# Patient Record
Sex: Female | Born: 1935 | Race: White | Hispanic: No | Smoking: Never smoker
Health system: Southern US, Community
[De-identification: ages and names within clinical notes are randomized; demographics above are authoritative.]

## PROBLEM LIST (undated history)

## (undated) DIAGNOSIS — I83009 Varicose veins of unspecified lower extremity with ulcer of unspecified site: Secondary | ICD-10-CM

## (undated) DIAGNOSIS — S81802A Unspecified open wound, left lower leg, initial encounter: Secondary | ICD-10-CM

## (undated) DIAGNOSIS — F32A Depression, unspecified: Secondary | ICD-10-CM

## (undated) DIAGNOSIS — R5383 Other fatigue: Secondary | ICD-10-CM

## (undated) DIAGNOSIS — I10 Essential (primary) hypertension: Secondary | ICD-10-CM

## (undated) DIAGNOSIS — Z8619 Personal history of other infectious and parasitic diseases: Secondary | ICD-10-CM

## (undated) DIAGNOSIS — E785 Hyperlipidemia, unspecified: Secondary | ICD-10-CM

## (undated) DIAGNOSIS — D696 Thrombocytopenia, unspecified: Secondary | ICD-10-CM

## (undated) DIAGNOSIS — L309 Dermatitis, unspecified: Secondary | ICD-10-CM

## (undated) DIAGNOSIS — I739 Peripheral vascular disease, unspecified: Secondary | ICD-10-CM

## (undated) DIAGNOSIS — Z860100 Personal history of colon polyps, unspecified: Secondary | ICD-10-CM

## (undated) DIAGNOSIS — L97909 Non-pressure chronic ulcer of unspecified part of unspecified lower leg with unspecified severity: Secondary | ICD-10-CM

## (undated) DIAGNOSIS — M1612 Unilateral primary osteoarthritis, left hip: Secondary | ICD-10-CM

## (undated) DIAGNOSIS — F419 Anxiety disorder, unspecified: Secondary | ICD-10-CM

## (undated) DIAGNOSIS — I872 Venous insufficiency (chronic) (peripheral): Secondary | ICD-10-CM

## (undated) DIAGNOSIS — I4891 Unspecified atrial fibrillation: Secondary | ICD-10-CM

## (undated) DIAGNOSIS — E559 Vitamin D deficiency, unspecified: Secondary | ICD-10-CM

## (undated) DIAGNOSIS — F0390 Unspecified dementia without behavioral disturbance: Secondary | ICD-10-CM

## (undated) DIAGNOSIS — R5381 Other malaise: Secondary | ICD-10-CM

## (undated) DIAGNOSIS — Z7901 Long term (current) use of anticoagulants: Secondary | ICD-10-CM

## (undated) DIAGNOSIS — R279 Unspecified lack of coordination: Secondary | ICD-10-CM

## (undated) DIAGNOSIS — H9190 Unspecified hearing loss, unspecified ear: Secondary | ICD-10-CM

## (undated) DIAGNOSIS — L97929 Non-pressure chronic ulcer of unspecified part of left lower leg with unspecified severity: Secondary | ICD-10-CM

## (undated) DIAGNOSIS — I509 Heart failure, unspecified: Secondary | ICD-10-CM

## (undated) DIAGNOSIS — R0602 Shortness of breath: Principal | ICD-10-CM

## (undated) DIAGNOSIS — M6281 Muscle weakness (generalized): Secondary | ICD-10-CM

## (undated) DIAGNOSIS — F329 Major depressive disorder, single episode, unspecified: Secondary | ICD-10-CM

## (undated) DIAGNOSIS — J449 Chronic obstructive pulmonary disease, unspecified: Secondary | ICD-10-CM

## (undated) DIAGNOSIS — I83029 Varicose veins of left lower extremity with ulcer of unspecified site: Secondary | ICD-10-CM

## (undated) DIAGNOSIS — R2681 Unsteadiness on feet: Secondary | ICD-10-CM

## (undated) DIAGNOSIS — E039 Hypothyroidism, unspecified: Secondary | ICD-10-CM

## (undated) HISTORY — DX: Hyperlipidemia, unspecified: E78.5

## (undated) HISTORY — DX: Non-pressure chronic ulcer of unspecified part of left lower leg with unspecified severity: L97.929

## (undated) HISTORY — DX: Unspecified hearing loss, unspecified ear: H91.90

## (undated) HISTORY — DX: Unspecified lack of coordination: R27.9

## (undated) HISTORY — DX: Heart failure, unspecified: I50.9

## (undated) HISTORY — DX: Shortness of breath: R06.02

## (undated) HISTORY — PX: CATARACT EXTRACTION W/ INTRAOCULAR LENS  IMPLANT, BILATERAL: SHX1307

## (undated) HISTORY — DX: Essential (primary) hypertension: I10

## (undated) HISTORY — DX: Unspecified open wound, left lower leg, initial encounter: S81.802A

## (undated) HISTORY — DX: Varicose veins of unspecified lower extremity with ulcer of unspecified site: I83.009

## (undated) HISTORY — DX: Other malaise: R53.81

## (undated) HISTORY — DX: Unilateral primary osteoarthritis, left hip: M16.12

## (undated) HISTORY — DX: Vitamin D deficiency, unspecified: E55.9

## (undated) HISTORY — DX: Personal history of colonic polyps: Z86.010

## (undated) HISTORY — DX: Muscle weakness (generalized): M62.81

## (undated) HISTORY — PX: CORONARY ANGIOPLASTY: SHX604

## (undated) HISTORY — DX: Other fatigue: R53.83

## (undated) HISTORY — DX: Varicose veins of unspecified lower extremity with ulcer of unspecified site: L97.909

## (undated) HISTORY — DX: Hypothyroidism, unspecified: E03.9

## (undated) HISTORY — DX: Unspecified atrial fibrillation: I48.91

## (undated) HISTORY — DX: Personal history of other infectious and parasitic diseases: Z86.19

## (undated) HISTORY — DX: Dermatitis, unspecified: L30.9

## (undated) HISTORY — DX: Varicose veins of left lower extremity with ulcer of unspecified site: I83.029

## (undated) HISTORY — DX: Peripheral vascular disease, unspecified: I73.9

## (undated) HISTORY — DX: Thrombocytopenia, unspecified: D69.6

## (undated) HISTORY — DX: Long term (current) use of anticoagulants: Z79.01

## (undated) HISTORY — DX: Unsteadiness on feet: R26.81

## (undated) HISTORY — DX: Personal history of colon polyps, unspecified: Z86.0100

## (undated) HISTORY — DX: Venous insufficiency (chronic) (peripheral): I87.2

## (undated) HISTORY — PX: EYE SURGERY: SHX253

---

## 2014-01-15 DIAGNOSIS — L309 Dermatitis, unspecified: Secondary | ICD-10-CM

## 2014-01-15 HISTORY — DX: Dermatitis, unspecified: L30.9

## 2014-02-16 LAB — CBC AND DIFFERENTIAL
HEMATOCRIT: 42 % (ref 36–46)
Hemoglobin: 14.1 g/dL (ref 12.0–16.0)
Platelets: 192 10*3/uL (ref 150–399)
WBC: 8.9 10*3/mL

## 2014-02-16 LAB — BASIC METABOLIC PANEL
BUN: 39 mg/dL — AB (ref 4–21)
Creatinine: 1 mg/dL (ref 0.5–1.1)
GLUCOSE: 80 mg/dL
Potassium: 4.1 mmol/L (ref 3.4–5.3)
SODIUM: 142 mmol/L (ref 137–147)

## 2014-02-17 ENCOUNTER — Non-Acute Institutional Stay (SKILLED_NURSING_FACILITY): Payer: Medicare Other | Admitting: Adult Health

## 2014-02-17 DIAGNOSIS — I83029 Varicose veins of left lower extremity with ulcer of unspecified site: Secondary | ICD-10-CM

## 2014-02-17 DIAGNOSIS — Z7901 Long term (current) use of anticoagulants: Secondary | ICD-10-CM

## 2014-02-17 DIAGNOSIS — E039 Hypothyroidism, unspecified: Secondary | ICD-10-CM

## 2014-02-17 DIAGNOSIS — I10 Essential (primary) hypertension: Secondary | ICD-10-CM

## 2014-02-17 DIAGNOSIS — L97929 Non-pressure chronic ulcer of unspecified part of left lower leg with unspecified severity: Secondary | ICD-10-CM

## 2014-02-17 DIAGNOSIS — L97909 Non-pressure chronic ulcer of unspecified part of unspecified lower leg with unspecified severity: Secondary | ICD-10-CM

## 2014-02-17 DIAGNOSIS — M1990 Unspecified osteoarthritis, unspecified site: Secondary | ICD-10-CM

## 2014-02-17 DIAGNOSIS — I83009 Varicose veins of unspecified lower extremity with ulcer of unspecified site: Secondary | ICD-10-CM

## 2014-02-17 DIAGNOSIS — I4891 Unspecified atrial fibrillation: Secondary | ICD-10-CM

## 2014-02-17 DIAGNOSIS — R21 Rash and other nonspecific skin eruption: Secondary | ICD-10-CM

## 2014-02-17 DIAGNOSIS — I509 Heart failure, unspecified: Secondary | ICD-10-CM

## 2014-02-19 ENCOUNTER — Encounter: Payer: Self-pay | Admitting: Adult Health

## 2014-02-19 DIAGNOSIS — Z7901 Long term (current) use of anticoagulants: Secondary | ICD-10-CM | POA: Insufficient documentation

## 2014-02-19 DIAGNOSIS — M1990 Unspecified osteoarthritis, unspecified site: Secondary | ICD-10-CM | POA: Insufficient documentation

## 2014-02-19 DIAGNOSIS — I509 Heart failure, unspecified: Secondary | ICD-10-CM | POA: Insufficient documentation

## 2014-02-19 DIAGNOSIS — I10 Essential (primary) hypertension: Secondary | ICD-10-CM | POA: Insufficient documentation

## 2014-02-19 DIAGNOSIS — I83029 Varicose veins of left lower extremity with ulcer of unspecified site: Secondary | ICD-10-CM | POA: Insufficient documentation

## 2014-02-19 DIAGNOSIS — E039 Hypothyroidism, unspecified: Secondary | ICD-10-CM | POA: Insufficient documentation

## 2014-02-19 DIAGNOSIS — I4891 Unspecified atrial fibrillation: Secondary | ICD-10-CM | POA: Insufficient documentation

## 2014-02-19 DIAGNOSIS — R21 Rash and other nonspecific skin eruption: Secondary | ICD-10-CM | POA: Insufficient documentation

## 2014-02-19 DIAGNOSIS — L97929 Non-pressure chronic ulcer of unspecified part of left lower leg with unspecified severity: Secondary | ICD-10-CM

## 2014-02-19 NOTE — Progress Notes (Signed)
Patient ID: Brittany Schultz, female   DOB: March 17, 1922, 80 y.o.   MRN: [id number]     [facility]  Allergies  Allergen Reactions  . Codeine   . Penicillins     Chief Complaint  Patient presents with  . Hospitalization Follow-up    HPI:  She has been hospitalized due to rash to her left lower leg from an unna boot. She did require use of steroids to control her rash. She is here for short term rehab due to physical weakness and wound management. Her goal is to return back home.    Past Medical History  Diagnosis Date  . CHF (congestive heart failure)   . Thyroid disease   . Hypertension   . Arthritis   . A-fib   . PVD (peripheral vascular disease)   . Venous stasis ulcer of left lower extremity      VITAL SIGNS BP 129/78  Pulse 70  Ht 5\' 7"  (1.702 m)  Wt 194 lb (87.998 kg)  BMI 30.38 kg/m2   Patient's Medications  New Prescriptions   No medications on file  Previous Medications   CARVEDILOL (COREG) 6.25 MG TABLET    Take 6.25 mg by mouth 2 (two) times daily with a meal.   FUROSEMIDE (LASIX) 40 MG TABLET    Take 40 mg by mouth 2 (two) times daily.   LEVOTHYROXINE (SYNTHROID, LEVOTHROID) 125 MCG TABLET    Take 125 mcg by mouth daily before breakfast.   MELOXICAM (MOBIC) 7.5 MG TABLET    Take 7.5 mg by mouth daily.   NONFORMULARY OR COMPOUNDED ITEM    Take 1 capsule by mouth 2 (two) times daily. Takes GARDEN BLEND; VINEYARD BLEND; ORCHARD BLEND; juice plus vitamins twice daily   POTASSIUM CHLORIDE SA (K-DUR,KLOR-CON) 20 MEQ TABLET    Take 40 mEq by mouth 2 (two) times daily.   WARFARIN (COUMADIN) 1 MG TABLET    Take 3-4 mg by mouth daily at 6 PM. Alternating doses of 3 mg and 4 mg  Modified Medications   No medications on file  Discontinued Medications   No medications on file    SIGNIFICANT DIAGNOSTIC EXAMS  None recent  LABS REVIEWED:  02-11-14:wbc 5.0; hgb 13.6; hct 40.4; mcv 93.9plt 144; glucose 142; bun 24; creat 1.42; k+4.5; na++135 02-15-12: wbc10.3;  hgb 13.0; hct 30.4 mcv 94.3; plt 195;  02-15-14: wbc 9.5; hgb 14.1;hct 40.8; mcv 94.1; plt 203; glucose 108; bun 40; creat 1.18; k+4.3; na++142 02-16-14: wbc 8.9; hgb 14.1;hct 42.2; mcv 93.6; plt 192; glucose 80; bun 39; creat 0.97; k+4.1; na++142     Review of Systems  Constitutional: Negative for malaise/fatigue.  Respiratory: Negative for cough, shortness of breath and wheezing.   Cardiovascular: Positive for leg swelling. Negative for chest pain and palpitations.       Has chronic lower extremity edema  Gastrointestinal: Negative for heartburn, abdominal pain and constipation.  Musculoskeletal: Negative for joint pain and myalgias.  Skin: Positive for itching and rash.       On back and abdomen   Neurological: Negative for weakness.  Psychiatric/Behavioral: Negative for depression. The patient is not nervous/anxious.      Physical Exam  Constitutional: She is oriented to person, place, and time. She appears well-developed and well-nourished. No distress.  obese  Neck: Neck supple. No JVD present. No thyromegaly present.  Cardiovascular: Normal rate, regular rhythm and intact distal pulses.   Pedal pulses diminished   Respiratory: Effort normal and breath sounds normal.  GI: Soft. Bowel sounds are normal. She exhibits no distension. There is no tenderness.  Musculoskeletal: Normal range of motion. She exhibits edema.  Has trace lower extremity edema present   Neurological: She is alert and oriented to person, place, and time.  Skin: Skin is warm and dry. She is not diaphoretic.  Has red raised rash covering her entire back and sides; which is itching.  Her lower legs are discolored due to pvd; and are tender to touch.  Her lower left leg venous ulcer with dressing and ace wrap without signs of infection present.   Psychiatric: She has a normal mood and affect.      ASSESSMENT/ PLAN:  1. Hypertension: is stable will continue coreg 6.25 mg twice daily and will monitor her  status   2. Hypothyroidism: will cotinue synthroid 125 mcg daily   3. Osteoarthritis: she is stable no complaints of pain present. Will continue mobic 7.5 mg daily   4. CHF: she is presently stable will continue lasix 40 mg daily with k+ 40 meq twice daily and will monitor her status   5. Macular rash; will begin prednisone 40 mg daily for 2 days then 20 mg daily for 2 days then stop. Will use triamcinolone 0.1 %crm to her body twice daily for her rash; and will use atarax 25 mg every 6 hours as needed for itching and will monitor her status  6. Venous stasis ulcer left lower leg; will continue her current treatment and will monitor her status.   7. Afib/anticoagulation management: her heart rate is stable her inr today is 1.9; will continue her alternating coumadin 3gm/4 mg and will check inr on 02-20-14 and will monitor her status.   Will check a cbc; cmp; tsh next draw   Time spent with patient 50 minutes.      Synthia Innocent NP [facility]  Contact [phone] Monday through Friday 8am- 5pm  After hours call [phone]

## 2014-02-20 ENCOUNTER — Encounter: Payer: Self-pay | Admitting: Internal Medicine

## 2014-02-20 ENCOUNTER — Non-Acute Institutional Stay (SKILLED_NURSING_FACILITY): Payer: Medicare Other | Admitting: Internal Medicine

## 2014-02-20 DIAGNOSIS — L97929 Non-pressure chronic ulcer of unspecified part of left lower leg with unspecified severity: Secondary | ICD-10-CM

## 2014-02-20 DIAGNOSIS — L97909 Non-pressure chronic ulcer of unspecified part of unspecified lower leg with unspecified severity: Secondary | ICD-10-CM

## 2014-02-20 DIAGNOSIS — I4891 Unspecified atrial fibrillation: Secondary | ICD-10-CM

## 2014-02-20 DIAGNOSIS — K5900 Constipation, unspecified: Secondary | ICD-10-CM

## 2014-02-20 DIAGNOSIS — R531 Weakness: Secondary | ICD-10-CM

## 2014-02-20 DIAGNOSIS — R5383 Other fatigue: Secondary | ICD-10-CM

## 2014-02-20 DIAGNOSIS — E039 Hypothyroidism, unspecified: Secondary | ICD-10-CM

## 2014-02-20 DIAGNOSIS — I83009 Varicose veins of unspecified lower extremity with ulcer of unspecified site: Secondary | ICD-10-CM

## 2014-02-20 DIAGNOSIS — R5381 Other malaise: Secondary | ICD-10-CM

## 2014-02-20 DIAGNOSIS — I83029 Varicose veins of left lower extremity with ulcer of unspecified site: Secondary | ICD-10-CM

## 2014-02-20 DIAGNOSIS — L259 Unspecified contact dermatitis, unspecified cause: Secondary | ICD-10-CM

## 2014-02-20 DIAGNOSIS — L309 Dermatitis, unspecified: Secondary | ICD-10-CM

## 2014-02-20 NOTE — Progress Notes (Signed)
Patient ID: Brittany Schultz, female   DOB: Oct 21, 1922, 80 y.o.   MRN: [id number]     [facility]  PCP- Dr Tamala Julian  Code Status: full code  Allergies  Allergen Reactions  . Codeine   . Penicillins     Chief Complaint: new admit  HPI:  80 y/o female patient is here for STR after hospital admission to [facility] from 02/10/14- 02/16/14 through wound clinic because of increased rash on her legs. Her unna boot was removed and she was put on steroids and the rash started resolving. Given her weakness, she was sent to SNF for rehabilitation. Goal is for her to return home. She is seen in her room today. She complaints of not having bowel movement for 3 days. She also complaints of rash in her arms and trunk and itching. She has pain in her left leg which is burning in nature with pricking sensation at times No new concern from staff. She has been working with therapy team  Review of Systems:  Constitutional: Negative for fever, chills, weight loss, diaphoresis. feels tired and weak HENT: Negative for congestion, hearing loss and sore throat.   Eyes: Negative for eye pain, blurred vision, double vision and discharge.  Respiratory: Negative for cough, sputum production, shortness of breath and wheezing.   Cardiovascular: Negative for chest pain, palpitations, orthopnea. Has leg swelling.  Gastrointestinal: Negative for heartburn, nausea, vomiting, abdominal pain, diarrhea  Genitourinary: Negative for dysuria, urgency, hematuria and flank pain. has increased urinary frequency Musculoskeletal: Negative for back pain, falls, joint pain and myalgias.  Neurological: Negative for dizziness, tingling, focal weakness and headaches.  Psychiatric/Behavioral: Negative for depression and memory loss. The patient is not nervous/anxious.     Past Medical History  Diagnosis Date  . CHF (congestive heart failure)   . Thyroid disease   . Hypertension   . Arthritis   . A-fib   . PVD  (peripheral vascular disease)   . Venous stasis ulcer of left lower extremity    History reviewed. No pertinent past surgical history. Social History:   has no tobacco, alcohol, and drug history on file.  Family History  Problem Relation Age of Onset  . Hypertension Mother   . Stroke Mother   . Heart disease Mother   . Hypertension Father   . Hypertension Brother     Medications: Patient's Medications  New Prescriptions   No medications on file  Previous Medications   CARVEDILOL (COREG) 6.25 MG TABLET    Take 6.25 mg by mouth 2 (two) times daily with a meal.   FUROSEMIDE (LASIX) 40 MG TABLET    Take 40 mg by mouth 2 (two) times daily.   LEVOTHYROXINE (SYNTHROID, LEVOTHROID) 125 MCG TABLET    Take 125 mcg by mouth daily before breakfast.   MELOXICAM (MOBIC) 7.5 MG TABLET    Take 7.5 mg by mouth daily.   NONFORMULARY OR COMPOUNDED ITEM    Take 1 capsule by mouth 2 (two) times daily. Takes GARDEN BLEND; VINEYARD BLEND; ORCHARD BLEND; juice plus vitamins twice daily   POTASSIUM CHLORIDE SA (K-DUR,KLOR-CON) 20 MEQ TABLET    Take 40 mEq by mouth 2 (two) times daily.   WARFARIN (COUMADIN) 1 MG TABLET    Take 3-4 mg by mouth daily at 6 PM. Alternating doses of 3 mg and 4 mg  Modified Medications   No medications on file  Discontinued Medications   No medications on file     Physical Exam:  Filed Vitals:   02/20/14 0951  BP: 102/65  Pulse: 89  Temp: 97.4 F (36.3 C)  Resp: 16  SpO2: 95%    General- elderly obese female in no acute distress Head- atraumatic, normocephalic Eyes- PERRLA, EOMI, no pallor, no icterus, no discharge Neck- no lymphadenopathy, no jugular vein distension Cardiovascular- irregular heart rate normal, no murmurs/ rubs/ gallops Respiratory- bilateral clear to auscultation, no wheeze, no rhonchi, no crackles, no use of accessory muscles Abdomen- bowel sounds present, soft, non tender Musculoskeletal- able to move all 4 extremities, no spinal and  paraspinal tenderness, left leg edema, left leg wrapped in ACE wrap Neurological- no focal deficit Skin- warm and dry, left lower leg venous ulcer present, macular rash in trunk and arm, blanching present, few ecchymoses noted in arms Psychiatry- alert and oriented to person, place and time, normal mood and affect   Labs reviewed: 02-11-14:wbc 5.0; hgb 13.6; hct 40.4; mcv 93.9plt 144; glucose 142; bun 24; creat 1.42; k+4.5; na++135 02-15-12: wbc10.3; hgb 13.0; hct 30.4 mcv 94.3; plt 195;   02-15-14: wbc 9.5; hgb 14.1;hct 40.8; mcv 94.1; plt 203; glucose 108; bun 40; creat 1.18; k+4.3; na++142 02-16-14: wbc 8.9; hgb 14.1;hct 42.2; mcv 93.6; plt 192; glucose 80; bun 39; creat 0.97; k+4.1; na++142    Assessment/Plan  Dermatitis- has recurrence of the rash she had in the hospital. It is present in her arms and trunk area. Will have her on prednisone 10 mg daily for 2 weeks for now. D/c triamcinolone cream. Will also change atarax to 25 mg q8h to help with itching. Check ESR to assess for inflammatory process. Check cbc with diff and lft. If no improvement, will need dermatology referral   Constipation- will have her on seena-s 1 tab daily and mirlax x1 now. Will also have miralax 17 g daily as needed for constipation if no bowel movement for more than 2 days  Generalized weakness- here for STR. Will have her work with PT and OT for strengthening exercises. Fall precautions  Venous stasis ulcer- in left lower leg. Will have her on gabapentin 100 mg daily for pain in the legs for now. Continue skin and wound care. Has gel mattress. Continue lasix 40 mg daily for the edema from stasis with kcl supplement. Check bmp  afib- rate controlled. Continue coreg for rate control and coumadin for stroke prophylaxis. inr due today  Hypothyroidism- continue synthroid 125 mcg daily   Family/ staff Communication: reviewed care plan with patient and nursing supervisor. Talked with her grand daughter, answered her  questions and reviewed care plan  Goals of care: home after STR   Labs/tests ordered: cbc, cmp, ESR    Blanchie Serve, MD  [facility] [phone] (Monday-Friday 8 am - 5 pm) [phone] (afterhours)

## 2014-02-21 DIAGNOSIS — K5900 Constipation, unspecified: Secondary | ICD-10-CM | POA: Insufficient documentation

## 2014-02-21 DIAGNOSIS — L309 Dermatitis, unspecified: Secondary | ICD-10-CM | POA: Insufficient documentation

## 2014-02-21 DIAGNOSIS — E039 Hypothyroidism, unspecified: Secondary | ICD-10-CM | POA: Insufficient documentation

## 2014-02-21 DIAGNOSIS — R531 Weakness: Secondary | ICD-10-CM | POA: Insufficient documentation

## 2014-02-23 ENCOUNTER — Non-Acute Institutional Stay (SKILLED_NURSING_FACILITY): Payer: Medicare Other | Admitting: Adult Health

## 2014-02-23 DIAGNOSIS — L97909 Non-pressure chronic ulcer of unspecified part of unspecified lower leg with unspecified severity: Secondary | ICD-10-CM

## 2014-02-23 DIAGNOSIS — I83009 Varicose veins of unspecified lower extremity with ulcer of unspecified site: Secondary | ICD-10-CM

## 2014-02-23 DIAGNOSIS — I83029 Varicose veins of left lower extremity with ulcer of unspecified site: Secondary | ICD-10-CM

## 2014-02-23 DIAGNOSIS — L97929 Non-pressure chronic ulcer of unspecified part of left lower leg with unspecified severity: Principal | ICD-10-CM

## 2014-02-23 DIAGNOSIS — L03119 Cellulitis of unspecified part of limb: Secondary | ICD-10-CM

## 2014-02-23 DIAGNOSIS — L02419 Cutaneous abscess of limb, unspecified: Secondary | ICD-10-CM

## 2014-02-25 ENCOUNTER — Encounter: Payer: Self-pay | Admitting: Adult Health

## 2014-02-25 DIAGNOSIS — L03119 Cellulitis of unspecified part of limb: Secondary | ICD-10-CM

## 2014-02-25 DIAGNOSIS — L02419 Cutaneous abscess of limb, unspecified: Secondary | ICD-10-CM | POA: Insufficient documentation

## 2014-02-25 NOTE — Progress Notes (Signed)
Patient ID: Brittany Schultz, female   DOB: Jan 08, 1922, 80 y.o.   MRN: [id number]    [facility]  Allergies  Allergen Reactions  . Codeine   . Penicillins      Chief Complaint  Patient presents with  . Acute Visit    wound management     HPI:  Her venous ulcerations have become inflamed and have a foul odor present. Then nursing staff is concerned that she has developed an infection.  Her pain is being managed with her neurontin and vicodin. Her treatment had been changed to silvergel several days ago and since that time her wounds have declined.    Past Medical History  Diagnosis Date  . CHF (congestive heart failure)   . Thyroid disease   . Hypertension   . Arthritis   . A-fib   . PVD (peripheral vascular disease)   . Venous stasis ulcer of left lower extremity     No past surgical history on file.  VITAL SIGNS BP 132/79  Pulse 70  Ht 5\' 7"  (1.702 m)  Wt 194 lb (87.998 kg)  BMI 30.38 kg/m2   Patient's Medications  New Prescriptions   No medications on file  Previous Medications   CARVEDILOL (COREG) 6.25 MG TABLET    Take 6.25 mg by mouth 2 (two) times daily with a meal.   FUROSEMIDE (LASIX) 40 MG TABLET    Take 40 mg by mouth 2 (two) times daily.   neurontin 100 mg  Take nightly    LEVOTHYROXINE (SYNTHROID, LEVOTHROID) 125 MCG TABLET    Take 125 mcg by mouth daily before breakfast.   MELOXICAM (MOBIC) 7.5 MG TABLET    Take 7.5 mg by mouth daily.   NONFORMULARY OR COMPOUNDED ITEM    Take 1 capsule by mouth 2 (two) times daily. Takes GARDEN BLEND; VINEYARD BLEND; ORCHARD BLEND; juice plus vitamins twice daily   POTASSIUM CHLORIDE SA (K-DUR,KLOR-CON) 20 MEQ TABLET    Take 40 mEq by mouth 2 (two) times daily.   vicodin 5/325 mg  Take prior to dressing change    Prednisone 10 mg  Take daily for 2 weeks from 02-20-14   miralax 17 gm daily prn    Senna s tab   take Nightly    WARFARIN (COUMADIN) 1 MG TABLET    Take 3-4 mg by mouth daily at 6 PM. Alternating doses  of 3 mg and 4 mg  Modified Medications   No medications on file  Discontinued Medications   No medications on file    SIGNIFICANT DIAGNOSTIC EXAMS  None recent  LABS REVIEWED:  02-11-14:wbc 5.0; hgb 13.6; hct 40.4; mcv 93.9plt 144; glucose 142; bun 24; creat 1.42; k+4.5; na++135 02-15-12: wbc10.3; hgb 13.0; hct 30.4 mcv 94.3; plt 195;  02-15-14: wbc 9.5; hgb 14.1;hct 40.8; mcv 94.1; plt 203; glucose 108; bun 40; creat 1.18; k+4.3; na++142 02-16-14: wbc 8.9; hgb 14.1;hct 42.2; mcv 93.6; plt 192; glucose 80; bun 39; creat 0.97; k+4.1; na++142     Review of Systems  Constitutional: Negative for malaise/fatigue.  Respiratory: Negative for cough, shortness of breath and wheezing.   Cardiovascular: Positive for leg swelling. Negative for chest pain and palpitations.       Has chronic lower extremity edema  Gastrointestinal: Negative for heartburn, abdominal pain and constipation.  Musculoskeletal: Negative for joint pain and myalgias.  Skin: positive for wounds on left leg.   Neurological: Negative for weakness.  Psychiatric/Behavioral: Negative for depression. The patient is not nervous/anxious.  Physical Exam  Constitutional: She is oriented to person, place, and time. She appears well-developed and well-nourished. No distress.  obese  Neck: Neck supple. No JVD present. No thyromegaly present.  Cardiovascular: Normal rate, regular rhythm and intact distal pulses.   Pedal pulses diminished   Respiratory: Effort normal and breath sounds normal.  GI: Soft. Bowel sounds are normal. She exhibits no distension. There is no tenderness.  Musculoskeletal: Normal range of motion. She exhibits edema.  Has trace lower extremity edema present   Neurological: She is alert and oriented to person, place, and time.  Skin: Skin is warm and dry. She is not diaphoretic.  Her left lower leg ulcerations are inflamed there is scant amount of drainage present there is foul odor present.  Psychiatric:  She has a normal mood and affect.      ASSESSMENT/ PLAN:  1. Venous stasis ulcerations: culture obtained; will begin ca++ alginate with santyl dressing. Will begin clindamycin 30 mg every 6 hours for 3 weeks with florastor twice daily for 3 weeks pending culture report and will continue to monitor her status.       Synthia Innocent NP [facility]  Contact [phone] Monday through Friday 8am- 5pm  After hours call [phone]

## 2014-02-27 ENCOUNTER — Encounter: Payer: Self-pay | Admitting: Adult Health

## 2014-02-27 ENCOUNTER — Non-Acute Institutional Stay (SKILLED_NURSING_FACILITY): Payer: Medicare Other | Admitting: Adult Health

## 2014-02-27 DIAGNOSIS — I4891 Unspecified atrial fibrillation: Secondary | ICD-10-CM

## 2014-02-27 DIAGNOSIS — Z7901 Long term (current) use of anticoagulants: Secondary | ICD-10-CM

## 2014-02-27 NOTE — Progress Notes (Signed)
Patient ID: Brittany Schultz, female   DOB: 03-18-1922, 80 y.o.   MRN: [id number]     [facility]  Allergies  Allergen Reactions  . Codeine   . Penicillins      Chief Complaint  Patient presents with  . Acute Visit    coumadin management     HPI:  She is on long term coumadin therapy for afib. Her inr today is 5.1 and she is on alternating dose of 3 mg and 4 mg daily. Her heart rate is under control. There are no concerns being voiced by the nursing staff at this time. She is presently on clindamycin for her left leg wounds.    Past Medical History  Diagnosis Date  . CHF (congestive heart failure)   . Thyroid disease   . Hypertension   . Arthritis   . A-fib   . PVD (peripheral vascular disease)   . Venous stasis ulcer of left lower extremity     No past surgical history on file.  VITAL SIGNS BP 117/70  Pulse 70  Ht 5\' 7"  (1.702 m)  Wt 199 lb 1.6 oz (90.311 kg)  BMI 31.18 kg/m2   Patient's Medications  New Prescriptions   No medications on file  Previous Medications   CARVEDILOL (COREG) 6.25 MG TABLET    Take 6.25 mg by mouth 2 (two) times daily with a meal.   FUROSEMIDE (LASIX) 40 MG TABLET    Take 40 mg by mouth 2 (two) times daily.   GABAPENTIN (NEURONTIN) 100 MG CAPSULE    Take 100 mg by mouth at bedtime.   HYDROCODONE-ACETAMINOPHEN (NORCO/VICODIN) 5-325 MG PER TABLET    Take 1 tablet by mouth daily as needed for moderate pain. Prior to dressing change   HYDROXYZINE (ATARAX/VISTARIL) 25 MG TABLET    Take 25 mg by mouth 3 (three) times daily.   LEVOTHYROXINE (SYNTHROID, LEVOTHROID) 125 MCG TABLET    Take 125 mcg by mouth daily before breakfast.   MELOXICAM (MOBIC) 7.5 MG TABLET    Take 7.5 mg by mouth daily.   NONFORMULARY OR COMPOUNDED ITEM    Take 1 capsule by mouth 2 (two) times daily. Takes GARDEN BLEND; VINEYARD BLEND; ORCHARD BLEND; juice plus vitamins twice daily   POLYETHYLENE GLYCOL (MIRALAX / GLYCOLAX) PACKET    Take 17 g by mouth daily as needed.     POTASSIUM CHLORIDE SA (K-DUR,KLOR-CON) 20 MEQ TABLET    Take 40 mEq by mouth 2 (two) times daily.   SENNOSIDES-DOCUSATE SODIUM (SENOKOT-S) 8.6-50 MG TABLET    Take 1 tablet by mouth daily.   WARFARIN (COUMADIN) 1 MG TABLET    Take 3-4 mg by mouth daily at 6 PM. Alternating doses of 3 mg and 4 mg  Modified Medications   No medications on file  Discontinued Medications   No medications on file    SIGNIFICANT DIAGNOSTIC EXAMS  None recent  LABS REVIEWED:  02-11-14:wbc 5.0; hgb 13.6; hct 40.4; mcv 93.9plt 144; glucose 142; bun 24; creat 1.42; k+4.5; na++135 02-15-12: wbc10.3; hgb 13.0; hct 30.4 mcv 94.3; plt 195;  02-15-14: wbc 9.5; hgb 14.1;hct 40.8; mcv 94.1; plt 203; glucose 108; bun 40; creat 1.18; k+4.3; na++142 02-16-14: wbc 8.9; hgb 14.1;hct 42.2; mcv 93.6; plt 192; glucose 80; bun 39; creat 0.97; k+4.1; na++142     Review of Systems  Constitutional: Negative for malaise/fatigue.  Respiratory: Negative for cough, shortness of breath and wheezing.   Cardiovascular: Positive for leg swelling. Negative for chest pain and  palpitations.       Has chronic lower extremity edema  Gastrointestinal: Negative for heartburn, abdominal pain and constipation.  Musculoskeletal: Negative for joint pain and myalgias.  Skin: positive for wounds on left leg.   Neurological: Negative for weakness.  Psychiatric/Behavioral: Negative for depression. The patient is not nervous/anxious.      Physical Exam  Constitutional: She is oriented to person, place, and time. She appears well-developed and well-nourished. No distress.  obese  Neck: Neck supple. No JVD present. No thyromegaly present.  Cardiovascular: Normal rate, regular rhythm and intact distal pulses.   Pedal pulses diminished   Respiratory: Effort normal and breath sounds normal.  GI: Soft. Bowel sounds are normal. She exhibits no distension. There is no tenderness.  Musculoskeletal: Normal range of motion. She exhibits edema.  Has trace  lower extremity edema present   Neurological: She is alert and oriented to person, place, and time.  Skin: Skin is warm and dry. She is not diaphoretic.  Psychiatric: She has a normal mood and affect.     ASSESSMENT/ PLAN:  1. Afib/anticoaluation management: for her inr of 5.1 will hold her coumadin for 2 days; will recheck her inr and will monitor her status.     Synthia Innocent NP [facility]  Contact [phone] Monday through Friday 8am- 5pm  After hours call [phone]

## 2014-03-13 ENCOUNTER — Non-Acute Institutional Stay (SKILLED_NURSING_FACILITY): Payer: Medicare Other | Admitting: Adult Health

## 2014-03-13 ENCOUNTER — Encounter: Payer: Self-pay | Admitting: Adult Health

## 2014-03-13 DIAGNOSIS — R5381 Other malaise: Secondary | ICD-10-CM

## 2014-03-13 DIAGNOSIS — R5383 Other fatigue: Secondary | ICD-10-CM

## 2014-03-13 DIAGNOSIS — R531 Weakness: Secondary | ICD-10-CM

## 2014-03-13 DIAGNOSIS — I4891 Unspecified atrial fibrillation: Secondary | ICD-10-CM

## 2014-03-13 DIAGNOSIS — L97929 Non-pressure chronic ulcer of unspecified part of left lower leg with unspecified severity: Secondary | ICD-10-CM

## 2014-03-13 DIAGNOSIS — I10 Essential (primary) hypertension: Secondary | ICD-10-CM

## 2014-03-13 DIAGNOSIS — I83009 Varicose veins of unspecified lower extremity with ulcer of unspecified site: Secondary | ICD-10-CM

## 2014-03-13 DIAGNOSIS — L97909 Non-pressure chronic ulcer of unspecified part of unspecified lower leg with unspecified severity: Secondary | ICD-10-CM

## 2014-03-13 DIAGNOSIS — Z7901 Long term (current) use of anticoagulants: Secondary | ICD-10-CM

## 2014-03-13 DIAGNOSIS — I83029 Varicose veins of left lower extremity with ulcer of unspecified site: Secondary | ICD-10-CM

## 2014-03-13 NOTE — Progress Notes (Signed)
Patient ID: Brittany Schultz, female   DOB: [birth year], 80 y.o.   MRN: [id number]     [facility]  Allergies  Allergen Reactions  . Codeine   . Penicillins      Chief Complaint  Patient presents with  . Discharge Note    HPI:  She is being discharged to assisted living [facility]. She will need home health for pt/ot/rn for coumadin and wound management. She will not need dme. She will need her prescriptions to be written.    Past Medical History  Diagnosis Date  . CHF (congestive heart failure)   . Thyroid disease   . Hypertension   . Arthritis   . A-fib   . PVD (peripheral vascular disease)   . Venous stasis ulcer of left lower extremity     No past surgical history on file.  VITAL SIGNS BP 127/69  Pulse 79  Ht 5\' 7"  (1.702 m)  Wt 201 lb (91.173 kg)  BMI 31.47 kg/m2   Patient's Medications  New Prescriptions   No medications on file  Previous Medications   CARVEDILOL (COREG) 6.25 MG TABLET    Take 6.25 mg by mouth 2 (two) times daily with a meal.   FUROSEMIDE (LASIX) 40 MG TABLET    Take 40 mg by mouth 2 (two) times daily.   GABAPENTIN (NEURONTIN) 100 MG CAPSULE    Take 100 mg by mouth at bedtime.   HYDROCODONE-ACETAMINOPHEN (NORCO/VICODIN) 5-325 MG PER TABLET    Take 1 tablet by mouth daily as needed for moderate pain. Prior to dressing change   HYDROXYZINE (ATARAX/VISTARIL) 25 MG TABLET    Take 25 mg by mouth 3 (three) times daily.   LEVOTHYROXINE (SYNTHROID, LEVOTHROID) 125 MCG TABLET    Take 125 mcg by mouth daily before breakfast.   MELOXICAM (MOBIC) 7.5 MG TABLET    Take 7.5 mg by mouth daily.   NONFORMULARY OR COMPOUNDED ITEM    Take 1 capsule by mouth 2 (two) times daily. Takes GARDEN BLEND; VINEYARD BLEND; ORCHARD BLEND; juice plus vitamins twice daily   POLYETHYLENE GLYCOL (MIRALAX / GLYCOLAX) PACKET    Take 17 g by mouth daily as needed.   POTASSIUM CHLORIDE SA (K-DUR,KLOR-CON) 20 MEQ TABLET    Take 40 mEq by mouth 2 (two) times daily.   SENNOSIDES-DOCUSATE SODIUM (SENOKOT-S) 8.6-50 MG TABLET    Take 1 tablet by mouth daily.   WARFARIN (COUMADIN) 1 MG TABLET    Take 3-4 mg by mouth daily at 6 PM. Alternating doses of 3 mg and 4 mg  Modified Medications   No medications on file  Discontinued Medications   No medications on file    SIGNIFICANT DIAGNOSTIC EXAMS  None recent  LABS REVIEWED:  02-11-14:wbc 5.0; hgb 13.6; hct 40.4; mcv 93.9plt 144; glucose 142; bun 24; creat 1.42; k+4.5; na++135 02-15-12: wbc10.3; hgb 13.0; hct 30.4 mcv 94.3; plt 195;  02-15-14: wbc 9.5; hgb 14.1;hct 40.8; mcv 94.1; plt 203; glucose 108; bun 40; creat 1.18; k+4.3; na++142 02-16-14: wbc 8.9; hgb 14.1;hct 42.2; mcv 93.6; plt 192; glucose 80; bun 39; creat 0.97; k+4.1; na++142     Review of Systems  Constitutional: Negative for malaise/fatigue.  Respiratory: Negative for cough, shortness of breath and wheezing.   Cardiovascular: Positive for leg swelling. Negative for chest pain and palpitations.       Has chronic lower extremity edema  Gastrointestinal: Negative for heartburn, abdominal pain and constipation.  Musculoskeletal: Negative for joint pain and myalgias.  Skin: positive  for wounds on left leg.   Neurological: Negative for weakness.  Psychiatric/Behavioral: Negative for depression. The patient is not nervous/anxious.      Physical Exam  Constitutional: She is oriented to person, place, and time. She appears well-developed and well-nourished. No distress.  obese  Neck: Neck supple. No JVD present. No thyromegaly present.  Cardiovascular: Normal rate, regular rhythm and intact distal pulses.   Pedal pulses diminished   Respiratory: Effort normal and breath sounds normal.  GI: Soft. Bowel sounds are normal. She exhibits no distension. There is no tenderness.  Musculoskeletal: Normal range of motion. She exhibits edema.  Has trace lower extremity edema present   Neurological: She is alert and oriented to person, place, and time.    Skin: Skin is warm and dry. She is not diaphoretic. Dressing intact to left lower leg Psychiatric: She has a normal mood and affect.     ASSESSMENT/ PLAN:  Will discharge her to [facility] assisted living. She will need home health for pt/ot/rn for coumadin management and wound management. Her coumadin 2.5 mg dose  is presently on hold for today and 03-14-14 her next inr is due 03-15-14. Her prescriptions have been written.    Time spent with patient 35 minutes.      Synthia Innocent NP [facility]  Contact [phone] Monday through Friday 8am- 5pm  After hours call [phone]

## 2014-03-15 DIAGNOSIS — D696 Thrombocytopenia, unspecified: Secondary | ICD-10-CM

## 2014-03-15 HISTORY — DX: Thrombocytopenia, unspecified: D69.6

## 2014-03-15 LAB — POCT INR: INR: 2.2 — AB (ref 0.9–1.1)

## 2014-03-16 ENCOUNTER — Encounter: Payer: Self-pay | Admitting: Geriatric Medicine

## 2014-03-16 ENCOUNTER — Non-Acute Institutional Stay: Payer: Medicare Other | Admitting: Geriatric Medicine

## 2014-03-16 DIAGNOSIS — I10 Essential (primary) hypertension: Secondary | ICD-10-CM

## 2014-03-16 DIAGNOSIS — E039 Hypothyroidism, unspecified: Secondary | ICD-10-CM

## 2014-03-16 DIAGNOSIS — M169 Osteoarthritis of hip, unspecified: Secondary | ICD-10-CM

## 2014-03-16 DIAGNOSIS — S91009A Unspecified open wound, unspecified ankle, initial encounter: Secondary | ICD-10-CM

## 2014-03-16 DIAGNOSIS — M1612 Unilateral primary osteoarthritis, left hip: Secondary | ICD-10-CM

## 2014-03-16 DIAGNOSIS — L97929 Non-pressure chronic ulcer of unspecified part of left lower leg with unspecified severity: Secondary | ICD-10-CM

## 2014-03-16 DIAGNOSIS — I83029 Varicose veins of left lower extremity with ulcer of unspecified site: Secondary | ICD-10-CM

## 2014-03-16 DIAGNOSIS — S81809A Unspecified open wound, unspecified lower leg, initial encounter: Secondary | ICD-10-CM

## 2014-03-16 DIAGNOSIS — M1610 Unilateral primary osteoarthritis, unspecified hip: Secondary | ICD-10-CM

## 2014-03-16 DIAGNOSIS — R21 Rash and other nonspecific skin eruption: Secondary | ICD-10-CM

## 2014-03-16 DIAGNOSIS — L97909 Non-pressure chronic ulcer of unspecified part of unspecified lower leg with unspecified severity: Secondary | ICD-10-CM

## 2014-03-16 DIAGNOSIS — S81009A Unspecified open wound, unspecified knee, initial encounter: Secondary | ICD-10-CM

## 2014-03-16 DIAGNOSIS — I83009 Varicose veins of unspecified lower extremity with ulcer of unspecified site: Secondary | ICD-10-CM

## 2014-03-16 DIAGNOSIS — I872 Venous insufficiency (chronic) (peripheral): Secondary | ICD-10-CM

## 2014-03-16 DIAGNOSIS — S81802A Unspecified open wound, left lower leg, initial encounter: Secondary | ICD-10-CM

## 2014-03-16 DIAGNOSIS — I4891 Unspecified atrial fibrillation: Secondary | ICD-10-CM

## 2014-03-16 DIAGNOSIS — E079 Disorder of thyroid, unspecified: Secondary | ICD-10-CM

## 2014-03-16 DIAGNOSIS — Z7901 Long term (current) use of anticoagulants: Secondary | ICD-10-CM

## 2014-03-16 LAB — POCT INR: INR: 1.8 — AB (ref 0.9–1.1)

## 2014-03-17 ENCOUNTER — Encounter: Payer: Self-pay | Admitting: Geriatric Medicine

## 2014-03-17 DIAGNOSIS — I872 Venous insufficiency (chronic) (peripheral): Secondary | ICD-10-CM | POA: Insufficient documentation

## 2014-03-17 DIAGNOSIS — S81802A Unspecified open wound, left lower leg, initial encounter: Secondary | ICD-10-CM | POA: Insufficient documentation

## 2014-03-17 DIAGNOSIS — I10 Essential (primary) hypertension: Secondary | ICD-10-CM | POA: Insufficient documentation

## 2014-03-17 DIAGNOSIS — E079 Disorder of thyroid, unspecified: Secondary | ICD-10-CM | POA: Insufficient documentation

## 2014-03-17 DIAGNOSIS — M1612 Unilateral primary osteoarthritis, left hip: Secondary | ICD-10-CM | POA: Insufficient documentation

## 2014-03-17 DIAGNOSIS — Z7901 Long term (current) use of anticoagulants: Secondary | ICD-10-CM | POA: Insufficient documentation

## 2014-03-17 MED ORDER — WARFARIN SODIUM 1 MG PO TABS: 2.5000 mg | ORAL_TABLET | Freq: Every day | ORAL | Status: DC

## 2014-03-17 MED ORDER — ACETAMINOPHEN 325 MG PO TABS: 650.0000 mg | ORAL_TABLET | Freq: Two times a day (BID) | ORAL | Status: DC

## 2014-03-17 MED ORDER — FUROSEMIDE 20 MG PO TABS: 20.0000 mg | ORAL_TABLET | Freq: Every day | ORAL | Status: DC

## 2014-03-17 MED ORDER — POTASSIUM CHLORIDE ER 10 MEQ PO TBCR: 10.0000 meq | EXTENDED_RELEASE_TABLET | Freq: Every day | ORAL | Status: DC

## 2014-03-17 NOTE — Assessment & Plan Note (Addendum)
Multiple open wounds left lower leg with appearance of excoriations likely from scratching. Wounds are without sign infection, there slough mixed with granulation tissue. Will keep covered with foam dressings and compression wraps, nursing staff will change every 2 days. Will be followed closely by the wound care nurse

## 2014-03-17 NOTE — Assessment & Plan Note (Signed)
Heart an irregular rhythm, rate well controlled, continue current medications including anticoagulation

## 2014-03-17 NOTE — Assessment & Plan Note (Signed)
INR just below therapeutic range, yesterday's result was satisfactory. Continue 2.5 mg warfarin daily, repeat INR Tuesday, April 7.  Future coagulation management will take place in the [facility] clinic

## 2014-03-17 NOTE — Assessment & Plan Note (Signed)
Left lateral malleolus with chronic ulcer, presumed to be related to venous stasis. Wound is currently without edema or erythema. There is a dry crust over the top. No specific interventions beyond treating lower extremity edema.

## 2014-03-17 NOTE — Progress Notes (Signed)
Patient ID: Brittany Schultz, female   DOB: [birth year], 80 y.o.   MRN: [id number]   [address] ([address])  Code Status: Full Code, Living Will, HCPOA Contact Information   Name Relation Home Work [place] Relative   [phone]      Chief Complaint  Patient presents with  . Admission to AL    HPI: This is a 80 y.o. female admitted to the Assisted Living section at [facility] today after her rehabilitation stay at [facility].   She was admitted to [facility] 02/10/2014 due to a persistent maculopapular rash that did not respond to treatment provided in the wound clinic. 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 During hospitalization she was treated with p.o. steroids with some improvement in her rash. Patient was noted to be in a debilitated state though not bed bound. She was not appropriate for discharge to her independent living home in [place], granddaughter requested transfer to rehabilitation facility in [place]. Patient was admitted to [facility] placed 02/16/2014; she participated with both PT and OT with improvement in her overall functional status. She continues to tire easily, does require some assistance with ADLs has been experiencing urge urinary incontinence. ESR was ordered for further evaluation of patient's dermatitis, results not available. Patient reports rash continues to be very itchy at times though not as severe as prior to hospitalization. Patient and granddaughter provided some further history regarding evaluation of patient's venous insufficiency. Her primary care provider treated her with increasing doses of diuretics/compression hose without much improvement. She was then evaluated by Dr. Florene Glen who was planning laser treatment. The patient was admitted to  hospital before this procedure could be completed.    Allergies  Allergen Reactions  . Codeine   . Penicillins     MEDICATIONS -     Medication List       This list is accurate as of: 03/16/14 11:59 PM.  Always use your most recent med list.               acetaminophen 325 MG tablet  Commonly known as:  TYLENOL  Take 2 tablets (650 mg total) by mouth 2 (two) times daily. Mat have additional 614m PO q 6hr as needed for pain     carvedilol 6.25 MG tablet  Commonly known as:  COREG  Take 6.25 mg by mouth 2 (two) times daily with a meal.     furosemide 20 MG tablet  Commonly known as:  LASIX  Take 1 tablet (20 mg total) by mouth daily.     gabapentin 100 MG capsule  Commonly known as:  NEURONTIN  Take 100 mg by mouth at bedtime.     hydrOXYzine 25 MG tablet  Commonly known as:  ATARAX/VISTARIL  Take 25 mg by mouth 3 (three) times daily.     levothyroxine 125 MCG tablet  Commonly known as:  SYNTHROID, LEVOTHROID  Take 125 mcg by mouth daily before breakfast.     NONFORMULARY OR COMPOUNDED ITEM  Take 1 capsule by mouth 2 (two) times daily. Takes GARDEN BLEND; VINEYARD BLEND; ORCHARD BLEND; juice plus vitamins twice daily     polyethylene glycol packet  Commonly known as:  MIRALAX / GLYCOLAX  Take 17 g by mouth daily as needed.     potassium chloride 10 MEQ  tablet  Commonly known as:  K-DUR  Take 1 tablet (10 mEq total) by mouth daily.     sennosides-docusate sodium 8.6-50 MG tablet  Commonly known as:  SENOKOT-S  Take 1 tablet by mouth daily.     warfarin 1 MG tablet  Commonly known as:  COUMADIN  Take 2.5 tablets (2.5 mg total) by mouth daily at 6 PM. Take 2.5 mg daily restricted for anticoagulation related to atrial fibrillation        DATA REVIEWED  Radiologic Exams:   Cardiovascular Exams:   Laboratory Studies Lab Results  Component Value Date   WBC 8.9 02/16/2014   HGB 14.1 02/16/2014   HCT 42 02/16/2014   PLT 192 02/16/2014   Lab Results    Component Value Date   NA 142 02/16/2014   K 4.1 02/16/2014   GLU 80 02/16/2014   BUN 39* 02/16/2014   CREATININE 1.0 02/16/2014    Lab Results  Component Value Date   INR 1.8* 03/16/2014   INR 2.2* 03/15/2014       Past Medical History  Diagnosis Date  . CHF (congestive heart failure)   . Thyroid disease   . Hypertension   . Osteoarthritis of left hip   . A-fib   . PVD (peripheral vascular disease)   . Venous stasis ulcer of left lower extremity   . Venous insufficiency of both lower extremities     vascular evaluation 01/2012 Dr.Powell  . Dermatitis 01/2014  . History of shingles   . Open wound of left lower leg 02/2104  . Long term (current) use of anticoagulants     warfarin for stroke risk reduction re: AF   No past surgical history on file. Family Status  Relation Status Death Age  . Mother Deceased   . Father Deceased   . Sister Deceased 2007  . Sister Deceased 2014  . Brother Deceased 2014  . Daughter Deceased 51    Lung cancer   History   Social History Narrative   Patient is Widowed. 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   Recently moved from her own home in [place], currently residing in assisted living section at [facility](02/2014)   No Smoking history, No Alcohol history   Patient has Advanced planning documents: Living Will, HCPOA                REVIEW OF SYSTEMS  DATA OBTAINED: from patient, medical record, family member GENERAL: Feels well   No recent fever, fatigue, change in appetite or weight SKIN: Itchy rash and open wounds present EYES: No eye pain, dryness or itching  No change in vision EARS: No earache, tinnitus, Is very HOH NOSE: No congestion, drainage or bleeding MOUTH/THROAT: No mouth or tooth pain  No sore throat   No difficulty chewing or swallowing RESPIRATORY: No cough, wheezing, SOB CARDIAC: No chest  pain, palpitations  No edema. GI: No abdominal pain  No nausea, vomiting,diarrhea or constipation  No heartburn or reflux  GU: No dysuria, frequency. Has urge incontinence (diuretic?)  No change in urine volume or character  MUSCULOSKELETAL:  Left hip discomfort especially when getting up and down No joint swelling or stiffness  No back pain    Gait is steady with walker, feeling stronger after physical therapy  No recent falls.  NEUROLOGIC: No dizziness, fainting, headache,  No change in   mental status.  PSYCHIATRIC: No feelings of anxiety, depression  Sleeps well.  No behavior issue.    PHYSICAL EXAM Filed Vitals:   03/16/14 1349  BP: 119/76  Pulse: 77  Temp: 97.6 F (36.4 C)  Resp: 20  Weight: 206 lb (93.441 kg)  SpO2: 97%   Body mass index is 32.26 kg/(m^2).  GENERAL APPEARANCE: No acute distress, appropriately groomed, Overweight body habitus. Alert, pleasant, conversant. SKIN: No diaphoresis. There is a confluent macular rash over the patient's lower extremities, abdomen, back and inner arms.  Left lower leg with several full-thickness skin wounds, no surrounding erythema or edema. Base of these wounds is a mix of slough and granulation tissue. Left lateral malleolus with small round wound covered with dry pale scab  HEAD: Normocephalic, atraumatic EYES: Conjunctiva/lids clear. Pupils round, reactive, intraocular lens implants evident. EOMs intact.  EARS: External exam WNL, diminished hearing  NOSE: No deformity or discharge. MOUTH/THROAT: Lips w/o lesions. Oral mucosa, tongue moist, w/o lesion. Oropharynx w/o redness or lesions.  NECK: Supple, full ROM. No thyroid tenderness, enlargement or nodule LYMPHATICS: No head, neck or supraclavicular adenopathy RESPIRATORY: Breathing is even, unlabored. Lung sounds are clear and full.  CARDIOVASCULAR: Heart IRRR. No murmur or extra heart sounds  ARTERIAL: No carotid bruit. Carotid pulse 2+. No palpable distal pulses  VENOUS: Venous  stasis skin changes present (hemosiderin deposits)  EDEMA: 1+ Left lower extremity edema GASTROINTESTINAL: Abdomen is obese, soft, non-tender, not distended w/ normal bowel sounds.  MUSCULOSKELETAL: Moves all extremities with full ROM, strength and tone. Back is without kyphosis, scoliosis or spinal process tenderness.  NEUROLOGIC: Oriented to time, place, person. Cranial nerves 2-12 grossly intact, speech clear, no tremor.  PSYCHIATRIC: Mood and affect appropriate to situation   ASSESSMENT/PLAN  Long term (current) use of anticoagulants INR just below therapeutic range, yesterday's result was satisfactory. Continue 2.5 mg warfarin daily, repeat INR Tuesday, April 7.  Future coagulation management will take place in the [facility] clinic  Open wound of left lower leg Multiple open wounds left lower leg with appearance of excoriations likely from scratching. Wounds are without sign infection, there slough mixed with granulation tissue. Will keep covered with foam dressings and compression wraps, nursing staff will change every 2 days. Will be followed closely by the wound care nurse  Osteoarthritis of left hip Patient reports left hip pain is not severe though it is annoying. She notes this discomfort mostly in the morning and after sitting for extended period. Discussed LE edema as possible side effect of Mobic  Will stop this medication, schedule acetaminophen.  Venous stasis ulcer of left lower extremity Left lateral malleolus with chronic ulcer, presumed to be related to venous stasis. Wound is currently without edema or erythema. There is a dry crust over the top. No specific interventions beyond treating lower extremity edema.  Macular rash Patient with persistent, confluent macular rash over her legs trunk, back and ventral surfaces of her arms. Itchiness present though not severe, continue hydroxyzine. Will simplify medication list, arrange for dermatology consultation.   Unspecified  hypothyroidism Long-standing hypothyroidism, no recent TSH. Continue current supplement, update lab  A-fib Heart an irregular rhythm, rate well controlled, continue current medications including anticoagulation  Essential hypertension, benign Blood pressure and pulse satisfactory today, continue current medication. Monitor weekly    Family/ staff Communication:  Granddaughter present for interview and exam, agrees with plan.  Time: 75 minutes, >50% spent counseling/or care coordination  Goals of care:   Restart physical and occupational therapy  to maximize functional status. Permanent level of care is unclear at this time   Labs/tests ordered: BMP, TSH, INR 03/21/2014   Follow up: Return for As needed.  Mardene Celeste, NP-[facility] [place] [phone]  03/17/2014

## 2014-03-17 NOTE — Assessment & Plan Note (Addendum)
Patient reports left hip pain is not severe though it is annoying. She notes this discomfort mostly in the morning and after sitting for extended period. Discussed LE edema as possible side effect of Mobic  Will stop this medication, schedule acetaminophen.

## 2014-03-17 NOTE — Assessment & Plan Note (Signed)
Patient with persistent, confluent macular rash over her legs trunk, back and ventral surfaces of her arms. Itchiness present though not severe, continue hydroxyzine. Will simplify medication list, arrange for dermatology consultation.

## 2014-03-17 NOTE — Assessment & Plan Note (Signed)
Long-standing hypothyroidism, no recent TSH. Continue current supplement, update lab

## 2014-03-17 NOTE — Assessment & Plan Note (Signed)
Blood pressure and pulse satisfactory today, continue current medication. Monitor weekly

## 2014-03-21 LAB — BASIC METABOLIC PANEL
BUN: 17 mg/dL (ref 4–21)
CREATININE: 0.8 mg/dL (ref 0.5–1.1)
Glucose: 74 mg/dL
Potassium: 3.9 mmol/L (ref 3.4–5.3)
Sodium: 142 mmol/L (ref 137–147)

## 2014-03-21 LAB — POCT INR: INR: 2 — AB (ref 0.9–1.1)

## 2014-03-23 ENCOUNTER — Encounter: Payer: Self-pay | Admitting: Geriatric Medicine

## 2014-03-23 ENCOUNTER — Non-Acute Institutional Stay: Payer: Medicare Other | Admitting: Internal Medicine

## 2014-03-23 DIAGNOSIS — I739 Peripheral vascular disease, unspecified: Secondary | ICD-10-CM

## 2014-03-23 DIAGNOSIS — R0602 Shortness of breath: Secondary | ICD-10-CM

## 2014-03-23 DIAGNOSIS — H9190 Unspecified hearing loss, unspecified ear: Secondary | ICD-10-CM | POA: Insufficient documentation

## 2014-03-23 DIAGNOSIS — R5383 Other fatigue: Secondary | ICD-10-CM

## 2014-03-23 DIAGNOSIS — R5381 Other malaise: Secondary | ICD-10-CM

## 2014-03-23 DIAGNOSIS — R2681 Unsteadiness on feet: Secondary | ICD-10-CM

## 2014-03-23 DIAGNOSIS — R279 Unspecified lack of coordination: Secondary | ICD-10-CM

## 2014-03-23 DIAGNOSIS — I83009 Varicose veins of unspecified lower extremity with ulcer of unspecified site: Secondary | ICD-10-CM

## 2014-03-23 DIAGNOSIS — E785 Hyperlipidemia, unspecified: Secondary | ICD-10-CM

## 2014-03-23 DIAGNOSIS — L97909 Non-pressure chronic ulcer of unspecified part of unspecified lower leg with unspecified severity: Secondary | ICD-10-CM

## 2014-03-23 DIAGNOSIS — R269 Unspecified abnormalities of gait and mobility: Secondary | ICD-10-CM

## 2014-03-23 DIAGNOSIS — M6281 Muscle weakness (generalized): Secondary | ICD-10-CM

## 2014-03-23 DIAGNOSIS — D696 Thrombocytopenia, unspecified: Secondary | ICD-10-CM

## 2014-03-23 HISTORY — DX: Shortness of breath: R06.02

## 2014-03-23 NOTE — Progress Notes (Signed)
Patient ID: Brittany Schultz, female   DOB: 07-06-1922, 80 y.o.   MRN: [id number]    Location:  [facility] [employer] of Service: ALF (13)  PCP: Kimber Relic, MD  Code Status: LIVING WILL, HCPOA  Extended Emergency Contact Information Primary Emergency Contact: Watson,Rena Address: [address] [address]          [place], [place] [address] [place] Home Phone: [phone] Relation: Grandaughter Secondary Emergency Contact: [name] Address: [address] [address]          [place], [place] [address] [place] Phone: [phone] Relation: Relative  Allergies  Allergen Reactions  . Codeine   . Penicillins     Chief Complaint  Patient presents with  . New Evaluation    new admission to [facility] by transfer from [facility]    HPI:  Shortness of breath: new problem that she says began a week ago. Denies fever, cough, chest pain, palpitations. Hx CHF.  Deaf: chronic issue. Even with bilateral hearing aids, she has difficulty with understaandin conversation in a quiet room.  Thrombocytopenia: appeared in the last couple of months. No bleeding. Mild. Last count 144,000  Varicose veins of lower extremities with ulcer: responding to current treat ment of coconut oil, foam dressing, Kerlix, and Coban.  Peripheral vascular disease, unspecified: diminished pulses in both feet. No hx of claudication  Muscle weakness (generalized): prior notes suggest this problem has been present for several months.  Other malaise and fatigue: present for several months. Existed before her recent complaints of dyspnea.  Lack of coordination: unstable gait and at risk for falls  Unstable gait:at risk for falls  Hyperlipidemia: chronic      Past Medical History  Diagnosis Date  . CHF (congestive heart failure)   . Unspecified hypothyroidism   . Hypertension   . Osteoarthritis of left hip   . A-fib    hospitalized 04/2009  . PVD (peripheral vascular disease)   . Venous stasis ulcer of left lower extremity   . Venous insufficiency of both lower extremities     vascular evaluation 01/2012 Dr.Powell  . Dermatitis 01/2014  . History of shingles   . Open wound of left lower leg 02/2104  . Long term (current) use of anticoagulants     warfarin for stroke risk reduction re: AF  . Vitamin D deficiency   . Hyperlipidemia   . Hx of colonic polyps     Adenomatous polyps  . Unstable gait   . Lack of coordination   . Other malaise and fatigue   . Muscle weakness (generalized)   . Peripheral vascular disease, unspecified   . Varicose veins of lower extremities with ulcer   . Thrombocytopenia April 2015    144,000  . Deaf   . Shortness of breath 03/23/14    Past Surgical History  Procedure Laterality Date  . Cataract extraction w/ intraocular lens  implant, bilateral Bilateral       Social History: History   Social History  . Marital Status: Widowed    Spouse Name: N/A    Number of Children: N/A  . Years of Education: N/A   Occupational History  . retired Research officer, political party    Social History Main Topics  . Smoking status: Never Smoker   . Smokeless tobacco: Never Used  . Alcohol Use: No  . Drug Use: None  . Sexual Activity: None   Other Topics Concern  . None  Social History Narrative   Patient is Widowed. 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   Recently moved from her own home in [place] [facility] [place], currently residing in [facility](02/2014)   No Smoking history, No Alcohol history   Patient has Advanced planning documents: Living Will, HCPOA                Family History Family Status  Relation Status Death Age  . Mother Deceased     HTN, CVA  . Father Deceased     HTN, heart disease, CVA  . Sister Deceased 2006/04/18  .  Sister Deceased 2013/04/18  . Brother Deceased 2013/04/18  . Daughter Deceased 45    Lung cancer   Family History  Problem Relation Age of Onset  . Hypertension Mother   . Stroke Mother   . Heart disease Mother   . Hypertension Father   . Hypertension Brother      Medications: Patient's Medications  New Prescriptions   No medications on file  Previous Medications   ACETAMINOPHEN (TYLENOL) 325 MG TABLET    Take 2 tablets (650 mg total) by mouth 2 (two) times daily. Mat have additional 650mg  PO q 6hr as needed for pain   CARVEDILOL (COREG) 6.25 MG TABLET    Take 6.25 mg by mouth 2 (two) times daily with a meal.   FUROSEMIDE (LASIX) 20 MG TABLET    Take 1 tablet (20 mg total) by mouth daily.   GABAPENTIN (NEURONTIN) 100 MG CAPSULE    Take 100 mg by mouth at bedtime.   HYDROXYZINE (ATARAX/VISTARIL) 25 MG TABLET    Take 25 mg by mouth 3 (three) times daily.   LEVOTHYROXINE (SYNTHROID, LEVOTHROID) 125 MCG TABLET    Take 125 mcg by mouth daily before breakfast.   NONFORMULARY OR COMPOUNDED ITEM    Take 1 capsule by mouth 2 (two) times daily. Takes GARDEN BLEND; VINEYARD BLEND; ORCHARD BLEND; juice plus vitamins twice daily   POLYETHYLENE GLYCOL (MIRALAX / GLYCOLAX) PACKET    Take 17 g by mouth daily as needed.   POTASSIUM CHLORIDE (K-DUR) 10 MEQ TABLET    Take 1 tablet (10 mEq total) by mouth daily.   SENNOSIDES-DOCUSATE SODIUM (SENOKOT-S) 8.6-50 MG TABLET    Take 1 tablet by mouth daily.   WARFARIN (COUMADIN) 1 MG TABLET    Take 2.5 tablets (2.5 mg total) by mouth daily at 6 PM. Take 2.5 mg daily restricted for anticoagulation related to atrial fibrillation  Modified Medications   No medications on file  Discontinued Medications   No medications on file     There is no immunization history on file for this patient.   Review of Systems  Constitutional: Positive for activity change and fatigue. Negative for fever, chills, diaphoresis, appetite change and unexpected weight change.  HENT:  Positive for hearing loss. Negative for congestion, mouth sores, postnasal drip and sore throat.   Eyes: Positive for visual disturbance (corrective lenses).  Respiratory: Positive for shortness of breath. Negative for cough, chest tightness, wheezing and stridor.   Cardiovascular: Positive for palpitations and leg swelling. Negative for chest pain.  Gastrointestinal: Negative for nausea, abdominal pain, diarrhea, constipation and abdominal distention.  Endocrine: Negative for cold intolerance, heat intolerance, polydipsia, polyphagia and polyuria.  Genitourinary: Positive for frequency. Negative for difficulty urinating.       Incontinence  Musculoskeletal: Positive for arthralgias, back pain, gait problem and myalgias. Negative for neck pain and neck stiffness.  Skin: Negative.   Allergic/Immunologic: Negative.   Neurological: Positive for weakness (generalized). Negative for dizziness, tremors, seizures, syncope, speech difficulty, light-headedness, numbness and headaches.  Hematological: Negative.   Psychiatric/Behavioral: Negative.       Filed Vitals:   03/23/14 1539  BP: 130/68  Pulse: 80  Temp: 98.6 F (37 C)  Resp: 18  Height: 5\' 7"  (1.702 m)  Weight: 206 lb (93.441 kg)   Physical Exam  Constitutional: She is oriented to person, place, and time. She appears well-developed and well-nourished. No distress.  Frail. Overweight.   HENT:  Bilateral hearing loss  Eyes: Conjunctivae and EOM are normal. Pupils are equal, round, and reactive to light.  Neck: No tracheal deviation present. No thyromegaly present.  Cardiovascular: Normal rate.   AF Diminished pedal pulses  Respiratory: No respiratory distress. She has no wheezes. She has no rales. She exhibits no tenderness.  GI: She exhibits no distension and no mass. There is no tenderness.  Musculoskeletal: Normal range of motion. She exhibits edema. She exhibits no tenderness.  Unstable gait. Using walker.  Lymphadenopathy:     She has no cervical adenopathy.  Neurological: She is alert and oriented to person, place, and time. No cranial nerve deficit. Coordination abnormal.  Forgetful ablut ceertain things.  Skin: Rash (on legs. has resolved at abd and axillae.) noted. No erythema.  Psychiatric: She has a normal mood and affect. Her behavior is normal. Thought content normal.       Labs reviewed: Nursing Home on 03/16/2014  Component Date Value Ref Range Status  . Hemoglobin 02/16/2014 14.1  12.0 - 16.0 g/dL Final  . HCT 04/18/2014 42  36 - 46 % Final  . Platelets 02/16/2014 192  150 - 399 K/L Final  . WBC 02/16/2014 8.9   Final  . Glucose 02/16/2014 80   Final  . BUN 02/16/2014 39* 4 - 21 mg/dL Final  . Creatinine 04/18/2014 1.0  0.5 - 1.1 mg/dL Final  . Potassium 15/94/5859 4.1  3.4 - 5.3 mmol/L Final  . Sodium 02/16/2014 142  137 - 147 mmol/L Final  . INR 03/15/2014 2.2* 0.9 - 1.1 Final  . INR 03/16/2014 1.8* 0.9 - 1.1 Final     Assessment/Plan 1. Shortness of breath Ordered CXR, BNP  2. Deaf OBSERVE  3. Thrombocytopenia observe  4. Varicose veins of lower extremities with ulcer Wrapped  At this time  5. Peripheral vascular disease, unspecified observe  6. Muscle weakness (generalized) Ordered CK and EST  7. Other malaise and fatigue Enrolled in PT and OT. Currently in an acute care bed in [place]. She may need higher level of care.  8. Lack of coordination PT, OT  9. Unstable gait PT, OT  10. Hyperlipidemia Recheck at later date

## 2014-03-24 LAB — POCT ERYTHROCYTE SEDIMENTATION RATE, NON-AUTOMATED: SED RATE: 100 mm

## 2014-03-30 ENCOUNTER — Encounter: Payer: Self-pay | Admitting: Geriatric Medicine

## 2014-03-30 ENCOUNTER — Non-Acute Institutional Stay: Payer: Medicare Other | Admitting: Geriatric Medicine

## 2014-03-30 DIAGNOSIS — S81809A Unspecified open wound, unspecified lower leg, initial encounter: Secondary | ICD-10-CM

## 2014-03-30 DIAGNOSIS — S91009A Unspecified open wound, unspecified ankle, initial encounter: Secondary | ICD-10-CM

## 2014-03-30 DIAGNOSIS — I4891 Unspecified atrial fibrillation: Secondary | ICD-10-CM

## 2014-03-30 DIAGNOSIS — Z7901 Long term (current) use of anticoagulants: Secondary | ICD-10-CM

## 2014-03-30 DIAGNOSIS — S81009A Unspecified open wound, unspecified knee, initial encounter: Secondary | ICD-10-CM

## 2014-03-30 DIAGNOSIS — R5381 Other malaise: Secondary | ICD-10-CM | POA: Insufficient documentation

## 2014-03-30 DIAGNOSIS — E039 Hypothyroidism, unspecified: Secondary | ICD-10-CM

## 2014-03-30 DIAGNOSIS — I10 Essential (primary) hypertension: Secondary | ICD-10-CM

## 2014-03-30 DIAGNOSIS — S81802A Unspecified open wound, left lower leg, initial encounter: Secondary | ICD-10-CM

## 2014-03-30 DIAGNOSIS — I509 Heart failure, unspecified: Secondary | ICD-10-CM

## 2014-03-30 DIAGNOSIS — R21 Rash and other nonspecific skin eruption: Secondary | ICD-10-CM

## 2014-03-30 NOTE — Assessment & Plan Note (Signed)
INR in therapeutic range, continue current dose dose of warfarin. Next INR will be in [facility]  April 29

## 2014-03-30 NOTE — Assessment & Plan Note (Signed)
Rash resolved after discontinuation of Mobic and lower dose of furosemide. There was no worsening of the rash when furosemide dose was increased from 20-40 mg daily. Will cancel the dermatology consultation for now.

## 2014-03-30 NOTE — Assessment & Plan Note (Signed)
TSH satisfactory, continue current dose of Synthroid

## 2014-03-30 NOTE — Assessment & Plan Note (Signed)
TSH satisfactory, continue current dose of levothyroxine. Monitor at intervals

## 2014-03-30 NOTE — Assessment & Plan Note (Signed)
Blood pressure reading stable, 117 143/67-80.

## 2014-03-30 NOTE — Assessment & Plan Note (Addendum)
Multiple open wounds of both lower extremity with minimal improvement. Remain free of infection, was continue with significant amounts of serous drainage likely delaying wound healing.  May benefit from daily dressing change.

## 2014-03-30 NOTE — Assessment & Plan Note (Signed)
On arrival at [facility] section this patient required significant assistance with ADLs, ambulation and incontinence care. She has worked with PT and OT and is regaining some strength and function. She is currently able to ambulate independently in her apartment, tires very easily and is limited to about 150 feett ambulation in the hallway. Patient does move slowly with ADLs, protein staff or spending significant amount of time with her, she is attempting to do more for herself daily. Bowel incontinence appears to have resolved, patient continues to be incontinent of urine. Sometimes requires assistance with management. Patient will benefit from time voiding and bowel training to assist in these areas.

## 2014-03-30 NOTE — Assessment & Plan Note (Signed)
Heart in regular rhythm today, continue current medication including anticoagulation

## 2014-03-30 NOTE — Assessment & Plan Note (Signed)
Mild elevation in BNP last week accompanied by shortness of breath and fatigue. Diuretic dose increased, no longer short of breath. Will ask for weekly weights

## 2014-03-30 NOTE — Progress Notes (Signed)
Patient ID: Brittany Schultz, female   DOB: 1922-02-06, 80 y.o.   MRN: [id number] Patient ID: Brittany Schultz, female   DOB: 1921/12/17, 80 y.o.   MRN: [id number]   [place] (13)  Code Status: Full Code, Living Will, HCPOA     Contact Information   Name Relation Home Work Mobile   [place] Grandaughter [phone]     Patrecia Pace Relative   [phone]      Chief Complaint  Patient presents with  . leg wounds  . Rash  . Debility    HPI: This is a 80 y.o. female admitted to the Assisted Living section at [facility] 03/16/2014 after rehabilitation stay at [facility].    Last visit: Long term (current) use of anticoagulants INR just below therapeutic range, yesterday's result was satisfactory. Continue 2.5 mg warfarin daily, repeat INR Tuesday, April 7.  Future coagulation management will take place in the [place] clinic  Open wound of left lower leg Multiple open wounds left lower leg with appearance of excoriations likely from scratching. Wounds are without sign infection, there slough mixed with granulation tissue. Will keep covered with foam dressings and compression wraps, nursing staff will change every 2 days. Will be followed closely by the wound care nurse  Osteoarthritis of left hip Patient reports left hip pain is not severe though it is annoying. She notes this discomfort mostly in the morning and after sitting for extended period. Discussed LE edema as possible side effect of Mobic  Will stop this medication, schedule acetaminophen.  Venous stasis ulcer of left lower extremity Left lateral malleolus with chronic ulcer, presumed to be related to venous stasis. Wound is currently without edema or erythema. There is a dry crust over the top. No specific interventions beyond treating lower extremity edema.  Macular rash Patient with persistent, confluent macular rash over her legs trunk, back and ventral surfaces of  her arms. Itchiness present though not severe, continue hydroxyzine. Will simplify medication list, arrange for dermatology consultation.   Unspecified hypothyroidism Long-standing hypothyroidism, no recent TSH. Continue current supplement, update lab  A-fib Heart an irregular rhythm, rate well controlled, continue current medications including anticoagulation  Essential hypertension, benign Blood pressure and pulse satisfactory today, continue current medication. Monitor weekly  Since last visit patient patient's extensive body rash has resolved. She reports today she is having absolutely no itching.  Left lower extremity wounds are being dressed every other day by the nursing staff and followed by the wound care nurse. Reportedly with minimal healing. The patient was experiencing some shortness of breath last week, lab showed mild CHF, diuretic dose was increased. Patient's respiratory status is stable, she discontinued it tired easily. There is no worsening of her rash with increased dose of Lasix. Lab studies done April 7 were satisfactory including therapeutic INR, TSH WNL. Lab April 10 showed sedimentation rate of 100, rash has resolved since then. Review of facility record shows vital signs have been stable, patient is eating well. She has been participating with physical and occupational therapies, making some improvement. She is able to ambulate with a walker about 150 feet, does continue to tire easily. She is attempting to do more for herself daily, does continue to require variable level of assistance with ADLs. Patient was experiencing some bowel incontinence in the first week or so however this has improved; no bowel incontinence the last 4 days. Patient has continued to be incontinent of urine, is improving with management of her own incontinence  garments.    Allergies  Allergen Reactions  . Codeine   . Penicillins     MEDICATIONS -     Medication List       This list is  accurate as of: 03/30/14  4:24 PM.  Always use your most recent med list.               acetaminophen 325 MG tablet  Commonly known as:  TYLENOL  Take 2 tablets (650 mg total) by mouth 2 (two) times daily. Mat have additional $RemoveBeforeD'650mg'SpshpZBrHADeao$  PO q 6hr as needed for pain     carvedilol 6.25 MG tablet  Commonly known as:  COREG  Take 6.25 mg by mouth 2 (two) times daily with a meal.     furosemide 40 MG tablet  Commonly known as:  LASIX  Take 40 mg by mouth daily.     gabapentin 100 MG capsule  Commonly known as:  NEURONTIN  Take 100 mg by mouth at bedtime.     hydrOXYzine 25 MG tablet  Commonly known as:  ATARAX/VISTARIL  Take 25 mg by mouth every 8 (eight) hours as needed.     levothyroxine 125 MCG tablet  Commonly known as:  SYNTHROID, LEVOTHROID  Take 125 mcg by mouth daily before breakfast.     NONFORMULARY OR COMPOUNDED ITEM  Take 1 capsule by mouth 2 (two) times daily. Takes GARDEN BLEND; VINEYARD BLEND; ORCHARD BLEND; juice plus vitamins twice daily     polyethylene glycol packet  Commonly known as:  MIRALAX / GLYCOLAX  Take 17 g by mouth daily as needed.     potassium chloride 10 MEQ tablet  Commonly known as:  K-DUR  Take 20 mEq by mouth daily.     sennosides-docusate sodium 8.6-50 MG tablet  Commonly known as:  SENOKOT-S  Take 1 tablet by mouth daily.     warfarin 2.5 MG tablet  Commonly known as:  COUMADIN  Take 2.5 mg by mouth daily.        DATA REVIEWED  Radiologic Exams:   Cardiovascular Exams  Laboratory Studies Lab Results  Component Value Date   WBC 8.9 02/16/2014   HGB 14.1 02/16/2014   HCT 42 02/16/2014   PLT 192 02/16/2014   Lab Results  Component Value Date   NA 142 02/16/2014   K 4.1 02/16/2014   GLU 80 02/16/2014   BUN 39* 02/16/2014   CREATININE 1.0 02/16/2014   TSH  0.710      02/20/2014  ESR  6      02/20/2014  Lab Results  Component Value Date   NA 142 03/21/2014   K 3.9 03/21/2014   GLU 74 03/21/2014   BUN 17 03/21/2014   CREATININE 0.8 03/21/2014    ESR  100      03/21/2014   Lab Results  Component Value Date   INR 2.0* 03/21/2014   INR 1.8* 03/16/2014   INR 2.2* 03/15/2014       REVIEW OF SYSTEMS  DATA OBTAINED: from patient, nurse , medical record,  GENERAL: Feels well   No recent fever, change in appetite or weight. "I tire easily, the dining room is about country mile away". Nurse reports patient has difficulty ambulating to the dining room especially for the evening meal SKIN: "Rash is all gone!"  EYES: No eye pain, dryness or itching  No change in vision EARS: No earache, tinnitus, "You know I am very HOH" NOSE: No congestion, drainage or bleeding MOUTH/THROAT: No mouth  or tooth pain  No sore throat   No difficulty chewing or swallowing RESPIRATORY: No cough, wheezing, SOB CARDIAC: No chest pain, palpitations  No edema. GI: No abdominal pain  No nausea, vomiting,diarrhea or constipation  No heartburn or reflux  GU: No dysuria, frequency. Has incontinence, wears incontinence garment. Notes she's no longer not having to get up at night No change in urine volume or character  MUSCULOSKELETAL:  Left hip discomfort especially when getting up and down No joint swelling or stiffness  No back pain    Gait is steady with walker, feeling stronger after physical therapy  No recent falls.  NEUROLOGIC: No dizziness, fainting, headache,  No change in mental status.  PSYCHIATRIC: No feelings of anxiety, depression  Sleeps well.  No behavior issue.    PHYSICAL EXAM Filed Vitals:   03/30/14 1622  BP: 122/68  Pulse: 83  Temp: 98 F (36.7 C)  Resp: 20  SpO2: 100%   There is no weight on file to calculate BMI.  GENERAL APPEARANCE: No acute distress, appropriately groomed, Overweight body habitus. Alert, pleasant, conversant. SKIN: No diaphoresis, no rash. Left lower leg remains with several full-thickness skin wounds, no surrounding erythema or edema. Base of these wounds with less slough, more granulation tissue. Left lateral malleolus  with small round wound covered with dry pale scab  Right lower leg with very dry flaky skin. Small superficial wound at Achilles tendon HEAD: Normocephalic, atraumatic EYES: Conjunctiva/lids clear. Pupils round, reactive, intraocular lens implants evident. EOMs intact.  EARS: External exam WNL, significantly diminished hearing despite hearing aide NOSE: No deformity or discharge. RESPIRATORY: Breathing is even, unlabored. Lung sounds are clear and full.  CARDIOVASCULAR: Heart RRR today. No murmur or extra heart sounds  VENOUS: Venous stasis skin changes present (hemosiderin deposits)  EDEMA: No lower extremity edema GASTROINTESTINAL: Abdomen is obese, soft, non-tender, not distended w/ normal bowel sounds.  MUSCULOSKELETAL: Moves all extremities with full ROM, strength and tone. Back is without kyphosis, scoliosis or spinal process tenderness. Able to rise easily from chair to walker, ambulates in her room steady w/ walker NEUROLOGIC: Oriented to time, place, person. Speech clear, no tremor.  PSYCHIATRIC: Mood and affect appropriate to situation   ASSESSMENT/PLAN  A-fib Heart in regular rhythm today, continue current medication including anticoagulation  Congestive heart failure, unspecified Mild elevation in BNP last week accompanied by shortness of breath and fatigue. Diuretic dose increased, no longer short of breath. Will ask for weekly weights  Essential hypertension, benign Blood pressure reading stable, 117 143/67-80.  Hypothyroidism TSH satisfactory, continue current dose of Synthroid  Unspecified hypothyroidism TSH satisfactory, continue current dose of levothyroxine. Monitor at intervals  Macular rash Rash resolved after discontinuation of Mobic and lower dose of furosemide. There was no worsening of the rash when furosemide dose was increased from 20-40 mg daily. Will cancel the dermatology consultation for now.  Open wound of left lower leg Multiple open wounds of both  lower extremity with minimal improvement. Remain free of infection, was continue with significant amounts of serous drainage likely delaying wound healing.  May benefit from daily dressing change.  Long term (current) use of anticoagulants INR in therapeutic range, continue current dose dose of warfarin. Next INR will be in [place] clinic  April 29  Debility On arrival at [facility] section this patient required significant assistance with ADLs, ambulation and incontinence care. She has worked with PT and OT and is regaining some strength and function. She is currently able to  ambulate independently in her apartment, tires very easily and is limited to about 150 feett ambulation in the hallway. Patient does move slowly with ADLs, protein staff or spending significant amount of time with her, she is attempting to do more for herself daily. Bowel incontinence appears to have resolved, patient continues to be incontinent of urine. Sometimes requires assistance with management. Patient will benefit from time voiding and bowel training to assist in these areas.    Family/ staff Communication:     Time:    Goals of care:   Continue physical and occupational therapy to maximize functional status. Permanent level of care remains unclear at this time   Labs/tests ordered:     Follow up: Return in about 2 weeks (around 04/12/2014) for INR, wound check.  Mardene Celeste, NP-C [place] [phone]  03/30/2014

## 2014-04-10 ENCOUNTER — Non-Acute Institutional Stay: Payer: Medicare Other | Admitting: Geriatric Medicine

## 2014-04-10 ENCOUNTER — Encounter: Payer: Self-pay | Admitting: Geriatric Medicine

## 2014-04-10 DIAGNOSIS — R21 Rash and other nonspecific skin eruption: Secondary | ICD-10-CM

## 2014-04-10 NOTE — Progress Notes (Signed)
Patient ID: Brittany Schultz, female   DOB: Aug 04, 1922, 80 y.o.   MRN: [id number]   [facility] [phone])   Contact Information   Name Relation Home Work Mobile   [place] Grandaughter [phone]     [name] Relative   [phone]       Chief Complaint  Patient presents with  . Rash    HPI: This is a 80 y.o. female admitted to the Assisted Living section at [facility] 03/16/2014 after rehabilitation stay at [facility].    Nurse called today to report rash has recurred over this patient's legs and her face. Rash is itchy, patient scratched her left leg today.  Last visit: A-fib Heart in regular rhythm today, continue current medication including anticoagulation Congestive heart failure, unspecified Mild elevation in BNP last week accompanied by shortness of breath and fatigue. Diuretic dose increased, no longer short of breath. Will ask for weekly weights Essential hypertension, benign Blood pressure reading stable, 117 143/67-80. Hypothyroidism TSH satisfactory, continue current dose of Synthroid Unspecified hypothyroidism TSH satisfactory, continue current dose of levothyroxine. Monitor at intervals Macular rash Rash resolved after discontinuation of Mobic and lower dose of furosemide. There was no worsening of the rash when furosemide dose was increased from 20-40 mg daily. Will cancel the dermatology consultation for now.    Allergies  Allergen Reactions  . Codeine   . Penicillins     MEDICATIONS -  Reviewed  DATA REVIEWED  Radiologic Exams:   Laboratory Studies:   REVIEW OF SYSTEMS  DATA OBTAINED: from patient, nurse, medical record, GENERAL: Feels well  No recent fever, fatigue, change in activity status, appetite, or weight  SKIN: Itchy rash is back! RESPIRATORY: No cough, wheezing, SOB CARDIAC: No chest pain, palpitations. No edema GI: No abdominal pain  No Nausea,vomiting,diarrhea or constipation  No  heartburn or reflux  PSYCHIATRIC: No feelings of anxiety, depression  Sleeps well   No behavior issue   PHYSICAL EXAM Filed Vitals:   04/10/14 1530  BP: 134/79  Pulse: 73  Temp: 97.3 F (36.3 C)  Resp: 20  SpO2: 97%   There is no weight on file to calculate BMI. GENERAL APPEARANCE: No acute distress, appropriately groomed, normal body habitus Alert, pleasant, conversant. SKIN: No diaphoresis Cheeks of face are red, no raised rash Anterior upper thighs with macular rash, pale red. Skin of both knees is red, dry, cracked.  HEAD: Normocephalic, atraumatic EYES: Conjunctiva/lids clear  RESPIRATORY: Breathing is even, unlabored  Lung sounds are clear and full  CARDIOVASCULAR: Heart RRR   No murmur or extra heart sounds   EDEMA: No peripheral edema   PSYCHIATRIC: Mood and affect appropriate to situation    ASSESSMENT/PLAN  Macular rash Macular rash has recurred over patient's upper thighs. Bilateral knees are red/ cracked; different appearance than rash. Both areas are itchy. Will decrease furosemide dose. Change skin care to [facility] cream to rash area, olive oil to other dry skin. Check Sed rate tomorrow. Receheck later this week    Family/ staff Communication:     Labs/tests ordered: 04/10/14 Sed rate   Follow up: Return for As scheduled. 4/29 in clinic for INR   Toribio Harbour, NP-C [facility] [phone]  04/10/2014

## 2014-04-10 NOTE — Assessment & Plan Note (Signed)
Macular rash has recurred over patient's upper thighs. Bilateral knees are red/ cracked; different appearance than rash. Both areas are itchy. Will decrease furosemide dose. Change skin care to [facility] cream to rash area, olive oil to other dry skin. Check Sed rate tomorrow. Receheck later this week

## 2014-04-11 LAB — POCT ERYTHROCYTE SEDIMENTATION RATE, NON-AUTOMATED: Sed Rate: 59 mm

## 2014-04-12 ENCOUNTER — Non-Acute Institutional Stay: Payer: Medicare Other | Admitting: Geriatric Medicine

## 2014-04-12 ENCOUNTER — Encounter: Payer: Self-pay | Admitting: Geriatric Medicine

## 2014-04-12 ENCOUNTER — Encounter: Payer: Medicare Other | Admitting: Geriatric Medicine

## 2014-04-12 VITALS — BP 148/78 | HR 84 | Wt 204.0 lb

## 2014-04-12 DIAGNOSIS — R21 Rash and other nonspecific skin eruption: Secondary | ICD-10-CM

## 2014-04-12 DIAGNOSIS — Z7901 Long term (current) use of anticoagulants: Secondary | ICD-10-CM

## 2014-04-12 DIAGNOSIS — I4891 Unspecified atrial fibrillation: Secondary | ICD-10-CM

## 2014-04-12 LAB — POCT INR: INR: 2.7 — AB (ref 0.9–1.1)

## 2014-04-12 MED ORDER — HYDROXYZINE HCL 25 MG PO TABS: 25.0000 mg | ORAL_TABLET | Freq: Three times a day (TID) | ORAL | Status: DC

## 2014-04-12 NOTE — Assessment & Plan Note (Signed)
INR in therapeutic range, continue current warfarin dose. Repeat INR in 3 weeks

## 2014-04-12 NOTE — Progress Notes (Signed)
Patient ID: Brittany Schultz, female   DOB: [birth year], 80 y.o.   MRN: [id number]        Code Status: Full Code, Living Will, HCPOA     Contact Information   Name Relation Home Work [place] Relative   [phone]      No chief complaint on file.   HPI: This is a 80 y.o. female admitted to the Assisted Living section at [facility] 03/16/2014 after rehabilitation stay at [facility].    Recent visits: 04/10/2014 Macular rash Macular rash has recurred over patient's upper thighs. Bilateral knees are red/ cracked; different appearance than rash. Both areas are itchy. Will decrease furosemide dose. Change skin care to [facility] cream to rash area, olive oil to other dry skin. Check Sed rate tomorrow. Recheck later this week  03/30/2014 A-fib Heart in regular rhythm today, continue current medication including anticoagulation Congestive heart failure, unspecified Mild elevation in BNP last week accompanied by shortness of breath and fatigue. Diuretic dose increased, no longer short of breath. Will ask for weekly weights Essential hypertension, benign Blood pressure reading stable, 117 143/67-80. Macular rash Rash resolved after discontinuation of Mobic and lower dose of furosemide. There was no worsening of the rash when furosemide dose was increased from 20-40 mg daily. Will cancel the dermatology consultation for now. Open wound of left lower leg Multiple open wounds of both lower extremity with minimal improvement. Remain free of infection, was continue with significant amounts of serous drainage likely delaying wound healing.  May benefit from daily dressing change. Long term (current) use of anticoagulants INR in therapeutic range, continue current dose dose of warfarin. Next INR will be in [place] clinic  April 29 Debility On arrival at [place] section this patient required significant  assistance with ADLs, ambulation and incontinence care. She has worked with PT and OT and is regaining some strength and function. She is currently able to ambulate independently in her apartment, tires very easily and is limited to about 150 feett ambulation in the hallway. Patient does move slowly with ADLs, protein staff or spending significant amount of time with her, she is attempting to do more for herself daily. Bowel incontinence appears to have resolved, patient continues to be incontinent of urine. Sometimes requires assistance with management. Patient will benefit from time voiding and bowel training to assist in these areas.  Since last visit itchy rash has spread to patient's back shoulders and upper arms. Hydrocortisone cream provide some temporary relief has not resolved the rash. Patient has not received any p.r.n. Hydroxyzine. There've been no changes in patient's medication. Patient continues to work with PT and OT to maximize strength and functional status. She is still requiring significant assistance with ADLs and tires easily when ambulating. Family is working with [facility] to determine most appropriate permanent placement for this patient     Allergies  Allergen Reactions  . Codeine   . Penicillins     MEDICATIONS -  Reviewed   DATA REVIEWED  Radiologic Exams:   Cardiovascular Exams  Laboratory Studies Lab Results  Component Value Date   WBC 8.9 02/16/2014   HGB 14.1 02/16/2014   HCT 42 02/16/2014   PLT 192 02/16/2014   Lab Results  Component Value Date   NA 142 03/21/2014   K 3.9 03/21/2014   GLU 74 03/21/2014   BUN 17 03/21/2014   CREATININE 0.8 03/21/2014   TSH  0.710  02/20/2014  ESR  6      02/20/2014  Lab Results  Component Value Date   NA 142 03/21/2014   K 3.9 03/21/2014   GLU 74 03/21/2014   BUN 17 03/21/2014   CREATININE 0.8 03/21/2014   ESR  100      03/21/2014   Lab Results  Component Value Date   INR 2.7* 04/12/2014   INR 2.0* 03/21/2014   INR 1.8* 03/16/2014    Lab Results  Component Value Date   ESRSEDRATE 59 04/11/2014      REVIEW OF SYSTEMS  DATA OBTAINED: from patient, nurse , medical record,  GENERAL: Feels well   No recent fever, change in appetite or weight. Tires easily, is attempting to ambulate to the dining room twice daily down "the longest hall" SKIN: "This itchy rash is driving me crazy"  EYES: No eye pain, dryness or itching  No change in vision EARS: No earache, tinnitus, "You know I am very HOH" RESPIRATORY: No cough, wheezing, SOB. Gets short of breath when she walks long distance CARDIAC: No chest pain, palpitations  No edema. NEUROLOGIC: No dizziness, fainting, headache,  No change in mental status.  PSYCHIATRIC: No feelings of anxiety, depression  Sleeps well.  No behavior issue.    PHYSICAL EXAM Filed Vitals:   04/12/14 1552  BP: 148/78  Pulse: 84  Weight: 204 lb (92.534 kg)   Body mass index is 31.94 kg/(m^2).  GENERAL APPEARANCE: No acute distress, appropriately groomed, Overweight body habitus. Alert, pleasant, conversant. SKIN: No diaphoresis. Macular rash and striated pattern on patient's back shoulders upper arms as well as across anterior knees. Macular rash present anterior upper thighs. HEAD: Normocephalic, atraumatic EYES: Conjunctiva/lids clear.  EARS: External exam WNL, significantly diminished hearing despite hearing aide RESPIRATORY: Breathing is even, unlabored. Lung sounds are clear and full.  CARDIOVASCULAR: Heart RRR today. No murmur or extra heart sounds NEUROLOGIC: Oriented to time, place, person. Speech clear, no tremor.  PSYCHIATRIC: Mood and affect appropriate to situation   ASSESSMENT/PLAN  A-fib Heart remains in regular rhythm, rate well controlled. Continue current medications including anticoagulation  Long term (current) use of anticoagulants INR in therapeutic range, continue current warfarin dose. Repeat INR in 3 weeks  Macular rash Recurrent rash has spread to patient's  back and shoulders. She is very uncomfortable with itchiness around the clock. Will resume hydroxyzine every 8 hours. Continue hydrocortisone cream. Sed rate inconclusive. Reschedule dermatology consult.    Family/ staff Communication:     Time:    Goals of care:   Continue physical and occupational therapy to maximize functional status. Permanent level of care remains unclear at this time   Labs/tests ordered:     Follow up: Return in about 3 weeks (around 05/03/2014) for INR.  Mardene Celeste, NP-C [place] [phone]  04/12/2014

## 2014-04-12 NOTE — Assessment & Plan Note (Signed)
Heart remains in regular rhythm, rate well controlled. Continue current medications including anticoagulation 

## 2014-04-12 NOTE — Assessment & Plan Note (Signed)
Recurrent rash has spread to patient's back and shoulders. She is very uncomfortable with itchiness around the clock. Will resume hydroxyzine every 8 hours. Continue hydrocortisone cream. Sed rate inconclusive. Reschedule dermatology consult.

## 2014-04-20 ENCOUNTER — Non-Acute Institutional Stay: Payer: Medicare Other | Admitting: Geriatric Medicine

## 2014-04-20 ENCOUNTER — Encounter: Payer: Self-pay | Admitting: Geriatric Medicine

## 2014-04-20 DIAGNOSIS — R21 Rash and other nonspecific skin eruption: Secondary | ICD-10-CM

## 2014-04-20 DIAGNOSIS — R609 Edema, unspecified: Secondary | ICD-10-CM

## 2014-04-20 DIAGNOSIS — I890 Lymphedema, not elsewhere classified: Secondary | ICD-10-CM | POA: Insufficient documentation

## 2014-04-20 MED ORDER — SPIRONOLACTONE 25 MG PO TABS
25.0000 mg | ORAL_TABLET | Freq: Every day | ORAL | Status: DC
Start: 2014-04-20 — End: 2019-02-09

## 2014-04-20 MED ORDER — HYDROXYZINE HCL 25 MG PO TABS: 25.0000 mg | ORAL_TABLET | Freq: Three times a day (TID) | ORAL | Status: DC | PRN

## 2014-04-20 NOTE — Progress Notes (Signed)
Patient ID: Brittany Schultz, female   DOB: 12-Sep-1922, 80 y.o.   MRN: [id number]   [place] (13)  Code Status: Full Code, Living Will, HCPOA  Contact Information   Name Relation Home Work [place] Relative   [phone]      Chief Complaint  Patient presents with  . Rash  . Edema    HPI: This is a 80 y.o. female admitted to the Assisted Living section at [facility] 03/16/2014 after rehabilitation stay at [facility].    Last visit: Macular rash Recurrent rash has spread to patient's back and shoulders. She is very uncomfortable with itchiness around the clock. Will resume hydroxyzine every 8 hours. Continue hydrocortisone cream. Sed rate inconclusive. Reschedule dermatology consult.  Since last visit furosemide dose has been reduced , rash has improved significantly. Patient has also developed worsening lower extremity edema    Allergies  Allergen Reactions  . Sulfa Antibiotics Rash    Rash w/ sulfonomides furosemide, meloxicam  . Codeine   . Penicillins   . Sulfonylureas     MEDICATIONS -  Reviewed   DATA REVIEWED  Radiologic Exams:   Cardiovascular Exams  Laboratory Studies Lab Results  Component Value Date   WBC 8.9 02/16/2014   HGB 14.1 02/16/2014   HCT 42 02/16/2014   PLT 192 02/16/2014   Lab Results  Component Value Date   NA 142 03/21/2014   K 3.9 03/21/2014   GLU 74 03/21/2014   BUN 17 03/21/2014   CREATININE 0.8 03/21/2014   TSH  0.710      02/20/2014  ESR  6      02/20/2014  Lab Results  Component Value Date   NA 142 03/21/2014   K 3.9 03/21/2014   GLU 74 03/21/2014   BUN 17 03/21/2014   CREATININE 0.8 03/21/2014   ESR  100      03/21/2014   Lab Results  Component Value Date   INR 2.7* 04/12/2014   INR 2.0* 03/21/2014   INR 1.8* 03/16/2014   Lab Results  Component Value Date   ESRSEDRATE 59 04/11/2014      REVIEW OF SYSTEMS  DATA OBTAINED: from patient,  nurse , medical record,  GENERAL: Feels well   No recent fever, change in appetite or weight. Tires easily, is able to ambulate to the dining room twice daily, near independence with basic ADLs.  SKIN: "The rash is less itchy"  EYES: No eye pain, dryness or itching  No change in vision EARS: No earache, tinnitus, "You know I am very HOH" RESPIRATORY: No cough, wheezing, SOB. Gets short of breath when she walks long distance CARDIAC: No chest pain, palpitations  No edema. NEUROLOGIC: No dizziness, fainting, headache,  No change in mental status.  PSYCHIATRIC: No feelings of anxiety, depression  Sleeps well.  No behavior issue.    PHYSICAL EXAM Filed Vitals:   04/20/14 1451  BP: 143/84  Pulse: 86  Temp: 97.4 F (36.3 C)  Resp: 20  Weight: 207 lb 12.8 oz (94.257 kg)  SpO2: 96%   Body mass index is 32.54 kg/(m^2).  GENERAL APPEARANCE: No acute distress, appropriately groomed, Overweight body habitus. Alert, pleasant, conversant. SKIN: No diaphoresis. Macular rash and striated pattern on patient's back shoulders upper arms has resolved. Confluent macular rash persists across upper thighs.  HEAD: Normocephalic, atraumatic EYES: Conjunctiva/lids clear.  EARS: External exam WNL, significantly diminished hearing despite hearing aide  RESPIRATORY: Breathing is even, unlabored. Lung sounds are clear and full.  CARDIOVASCULAR: Heart RRR today. No murmur or extra heart sounds  EDEMA: Bilateral lower extremity edema from mid calves up to and including both thighs NEUROLOGIC: Oriented to time, place, person. Speech clear, no tremor.  PSYCHIATRIC: Mood and affect appropriate to situation   ASSESSMENT/PLAN  Macular rash It appears this rash is related to furosemide since there was significant improvement with decreased dose. The initial rest rash improved once a meloxicam was stopped and furosemide dose was reduced. This likely represents a sensitivity to sulfonamides. Will change  diuretic. Dermatology appointment is scheduled next week  Edema Lower extremity edema is likely related to venous insufficiency. Edema is worse since dose reduction of diuretic. She may have some element of heart failure related edema as well. Patient appears to have allergy to sulfonamide. Will change diuretic to spironolactone. Stop potassium, check BMP on Tuesday    Family/ staff Communication:     Time:    Goals of care:   Continue physical and occupational therapy to maximize functional status. Permanent level of care remains unclear at this time   Labs/tests ordered:     Follow up: Return for As scheduled, As needed.  Mardene Celeste, NP-C [place] [phone]  04/20/2014

## 2014-04-20 NOTE — Assessment & Plan Note (Signed)
It appears this rash is related to furosemide since there was significant improvement with decreased dose. The initial rest rash improved once a meloxicam was stopped and furosemide dose was reduced. This likely represents a sensitivity to sulfonamides. Will change diuretic. Dermatology appointment is scheduled next week

## 2014-04-20 NOTE — Assessment & Plan Note (Signed)
Lower extremity edema is likely related to venous insufficiency. Edema is worse since dose reduction of diuretic. She may have some element of heart failure related edema as well. Patient appears to have allergy to sulfonamide. Will change diuretic to spironolactone. Stop potassium, check BMP on Tuesday

## 2014-04-25 LAB — BASIC METABOLIC PANEL
BUN: 82 mg/dL — AB (ref 4–21)
Creatinine: 0.8 mg/dL (ref 0.5–1.1)
Glucose: 82 mg/dL
POTASSIUM: 3.7 mmol/L (ref 3.4–5.3)
SODIUM: 141 mmol/L (ref 137–147)

## 2014-05-03 ENCOUNTER — Non-Acute Institutional Stay: Payer: Medicare Other | Admitting: Geriatric Medicine

## 2014-05-03 ENCOUNTER — Encounter: Payer: Self-pay | Admitting: Geriatric Medicine

## 2014-05-03 VITALS — BP 142/96 | HR 80

## 2014-05-03 DIAGNOSIS — R609 Edema, unspecified: Secondary | ICD-10-CM

## 2014-05-03 DIAGNOSIS — I4891 Unspecified atrial fibrillation: Secondary | ICD-10-CM

## 2014-05-03 DIAGNOSIS — Z7901 Long term (current) use of anticoagulants: Secondary | ICD-10-CM

## 2014-05-03 LAB — POCT INR: INR: 2.5 — AB (ref 0.9–1.1)

## 2014-05-03 NOTE — Progress Notes (Signed)
Patient ID: Brittany Schultz, female   DOB: 29-Sep-1922, 80 y.o.   MRN: [id number]   [facility] [phone])  Code Status: Full Code, Living Will, HCPOA  Contact Information   Name Relation Home Work Mobile   [place] Grandaughter [phone]     [name] Relative   [phone]      Chief Complaint  Patient presents with  . Medical Management of Chronic Issues    coag check   . Medication Problem    on Prednison-feels nervous, heart racing, SOB    HPI: This is a 80 y.o. female admitted to the Assisted Living section at [facility] 03/16/2014 after rehabilitation stay at [facility].    Recent visits: Macular rash It appears this rash is related to furosemide since there was significant improvement with decreased dose. The initial rest rash improved once a meloxicam was stopped and furosemide dose was reduced. This likely represents a sensitivity to sulfonamides. Will change diuretic. Dermatology appointment is scheduled next week Edema Lower extremity edema is likely related to venous insufficiency. Edema is worse since dose reduction of diuretic. She may have some element of heart failure related edema as well. Patient appears to have allergy to sulfonamide. Will change diuretic to spironolactone. Stop potassium, check BMP on Tuesday A-fib Heart remains in regular rhythm, rate well controlled. Continue current medications including anticoagulation  Long term (current) use of anticoagulants INR in therapeutic range, continue current warfarin dose. Repeat INR in 3 weeks  Since last visit patient has been evaluated by dermatology, prednisone has been added. Patient reports the rash and itching are both better. She is feeling quite nervous and jittery with the prednisone Wound care nurse continues to follow patient's lower extremity wounds. She notes some worsening in the condition of these wounds today, has changed the dressing  treatment. Patient is complaining of more shortness of breath today, feels this is worse since starting prednisone.   Allergies  Allergen Reactions  . Sulfa Antibiotics Rash    Rash w/ sulfonomides furosemide, meloxicam  . Codeine   . Penicillins   . Sulfonylureas     MEDICATIONS -  Reviewed   DATA REVIEWED  Radiologic Exams:   Cardiovascular Exams  Laboratory Studies Lab Results  Component Value Date   WBC 8.9 02/16/2014   HGB 14.1 02/16/2014   HCT 42 02/16/2014   PLT 192 02/16/2014   Lab Results  Component Value Date   NA 142 03/21/2014   K 3.9 03/21/2014   GLU 74 03/21/2014   BUN 17 03/21/2014   CREATININE 0.8 03/21/2014   TSH  0.710      02/20/2014   Lab Results  Component Value Date   NA 141 04/25/2014   K 3.7 04/25/2014   GLU 82 04/25/2014   BUN 82* 04/25/2014   CREATININE 0.8 04/25/2014     Lab Results  Component Value Date   INR 2.5* 05/03/2014   INR 2.7* 04/12/2014   INR 2.0* 03/21/2014       REVIEW OF SYSTEMS  DATA OBTAINED: from patient, nurse , medical record,  GENERAL: Feels well   No recent fever, change in appetite or weight. Tires easily, is able to ambulate to the dining room twice daily, near independence with basic ADLs. More difficulty ambulating lately due to SOB SKIN: "The rash is less itchy"  EYES: No eye pain, dryness or itching  No change in vision EARS: No earache, tinnitus, "You know I am very HOH" RESPIRATORY: No cough, wheezing,  SOB. Gets short of breath when she walks, this comes and goes.  CARDIAC: No chest pain, palpitations  No edema. NEUROLOGIC: No dizziness, fainting, headache,  No change in mental status.  PSYCHIATRIC: No feelings of anxiety, depression  Sleeps well.  No behavior issue.    PHYSICAL EXAM Filed Vitals:   05/03/14 1546  BP: 142/96  Pulse: 80  SpO2: 99%   There is no weight on file to calculate BMI.  GENERAL APPEARANCE: No acute distress, appropriately groomed, Overweight body habitus. Alert, pleasant,  conversant. SKIN: No diaphoresis. Macular rash and striated pattern on patient's back shoulders upper arms has resolved. Confluent macular rash persists across upper thighs.  HEAD: Normocephalic, atraumatic EYES: Conjunctiva/lids clear.  EARS: External exam WNL, significantly diminished hearing despite hearing aide RESPIRATORY: Breathing is mildly labored, mild audible upper airway wheeze. Lung sounds are decreased bilaterally.   CARDIOVASCULAR: Heart IRRR today. No murmur or extra heart sounds  EDEMA: Bilateral lower extremity edema from mid calves up to and including both thighs NEUROLOGIC: Oriented to time, place, person. Speech clear, no tremor.  PSYCHIATRIC: Mood and affect appropriate to situation   ASSESSMENT/PLAN  A-fib Heart an irregular rhythm today, rate is well-controlled. Irregular rhythm may be contributing to patient's shortness of breath. Schedule echocardiogram to evaluate heart function  Long term (current) use of anticoagulants INR remains in therapeutic range, continue current warfarin dose. Repeat INR in 4 weeks  Edema Lower extremity edema has not improved since starting spironolactone however patient has also been on a course of prednisone. This can contribute to edema as well. We'll not increase diuretic today, last nursing staff to document daily weights.    Family/ staff Communication:     Time:    Goals of care:      Labs/tests ordered:  Echocardiogram   Follow up: Return in about 4 weeks (around 05/31/2014) for INR.  Toribio Harbour, NP-C [facility] [phone]  05/03/2014

## 2014-05-03 NOTE — Assessment & Plan Note (Signed)
Heart an irregular rhythm today, rate is well-controlled. Irregular rhythm may be contributing to patient's shortness of breath. Schedule echocardiogram to evaluate heart function

## 2014-05-03 NOTE — Assessment & Plan Note (Signed)
INR remains in therapeutic range, continue current warfarin dose. Repeat INR in 4 weeks 

## 2014-05-03 NOTE — Assessment & Plan Note (Addendum)
Lower extremity edema has not improved since starting spironolactone however patient has also been on a course of prednisone. This can contribute to edema as well. We'll not increase diuretic today, last nursing staff to document daily weights.

## 2014-05-11 LAB — BASIC METABOLIC PANEL
BUN: 19 mg/dL (ref 4–21)
Creatinine: 0.8 mg/dL (ref 0.5–1.1)
Glucose: 79 mg/dL
POTASSIUM: 3.8 mmol/L (ref 3.4–5.3)
Sodium: 139 mmol/L (ref 137–147)

## 2014-05-24 ENCOUNTER — Encounter: Payer: Self-pay | Admitting: Geriatric Medicine

## 2014-05-24 ENCOUNTER — Encounter: Payer: Medicare Other | Admitting: Geriatric Medicine

## 2014-05-26 ENCOUNTER — Encounter: Payer: Self-pay | Admitting: Geriatric Medicine

## 2014-05-26 ENCOUNTER — Non-Acute Institutional Stay: Payer: Medicare Other | Admitting: Geriatric Medicine

## 2014-05-26 DIAGNOSIS — S81009A Unspecified open wound, unspecified knee, initial encounter: Secondary | ICD-10-CM

## 2014-05-26 DIAGNOSIS — I890 Lymphedema, not elsewhere classified: Secondary | ICD-10-CM

## 2014-05-26 DIAGNOSIS — S81809A Unspecified open wound, unspecified lower leg, initial encounter: Secondary | ICD-10-CM

## 2014-05-26 DIAGNOSIS — R21 Rash and other nonspecific skin eruption: Secondary | ICD-10-CM

## 2014-05-26 DIAGNOSIS — S91009A Unspecified open wound, unspecified ankle, initial encounter: Secondary | ICD-10-CM

## 2014-05-26 DIAGNOSIS — S81802A Unspecified open wound, left lower leg, initial encounter: Secondary | ICD-10-CM

## 2014-05-26 NOTE — Assessment & Plan Note (Addendum)
Edema of both lower extremities continues despite use of diuretic, compression of the lower legs and leg elevation. The edema is nonpitting. The tissues are very stiff and thick; consistent with lymphedema. The situation is contributing to poor healing of her lower extremity wounds. This patient would likely benefit from improved compression, possibly with sequential compression device.

## 2014-05-26 NOTE — Progress Notes (Signed)
Patient ID: Brittany Schultz, female   DOB: 04-01-1922, 80 y.o.   MRN: [id number]   [facility] (13)  Code Status: Full Code, Living Will, HCPOA  Contact Information   Name Relation Home Work Mobile   [place] Grandaughter [phone]     [name] Relative   [phone]      Chief Complaint  Patient presents with  . LE wounds  . Leg Swelling    HPI: This is a 80 y.o. female resident of [facility], Assisted Living section. She moved here  03/16/2014 after a rehabilitation stay at [facility].   She was admitted to [facility] November 17, 202015 due to a persistent maculopapular rash that did not respond to treatment provided in the wound clinic. 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 During hospitalization she was treated with p.o. steroids with some improvement in her rash. Patient was noted to be in a debilitated state though not bed bound. She was not appropriate for discharge to her independent living home in [place], granddaughter requested transfer to rehabilitation facility in [place]. Patient was admitted to [facility] 02/16/2014; she participated with both PT and OT with improvement in her overall functional status. On arrival to [facility] 03/16/2014, patient had nearly total-body rash as well as multiple wounds on her left lower extremity. Lower extremities were very edematous as well.   Prior to hospitalization, according to history provided by patient and her granddaughter, patient's primary care provider treated LE edema with increasing doses of diuretics/compression hose without much improvement. She had vascular evaluation by Dr. Lowell Guitar  who was planning laser treatment. The patient was admitted to hospital before this procedure could be completed.  Recent visits: Macular rash It appears this rash is  related to furosemide since there was significant improvement with decreased dose. The initial rash improved once meloxicam was stopped and furosemide dose was reduced. This likely represents a sensitivity to sulfonamides. Diuretic was changed to spironolactone.. Patient was evaluated by dermatology who prescribed a prednisone taper. The patient was very nervous and jittery with the prednisone but did manage to complete a tapering course. The macular rash has resolved A-fib Heart generally in irregular rhythm, rate is well-controlled. Irregular rhythm may be contributing to patient's shortness of breath. Echocardiogram was completed evaluate heart function Long term (current) use of anticoagulants INR remains in therapeutic range, continue current warfarin dose. Repeat INR 05/31/2014 Edema Lower extremity edema has not improved significantly since starting spironolactone, patient's weight is down a few pounds. Pedal /lower leg edema is better, knee and thigh edema is worse.    Allergies  Allergen Reactions  . Sulfa Antibiotics Rash    Rash w/ sulfonomides furosemide, meloxicam  . Codeine   . Penicillins   . Sulfonylureas     MEDICATIONS -  Reviewed Past Medical History  Diagnosis Date  . CHF (congestive heart failure)   . Unspecified hypothyroidism   . Hypertension   . Osteoarthritis of left hip   . A-fib     hospitalized 04/2009  . PVD (peripheral vascular disease)   . Venous stasis ulcer of left lower extremity   . Venous insufficiency of both lower extremities     vascular evaluation 01/2012 Dr.Powell  . Dermatitis 01/2014  . History of shingles   . Open wound of left lower leg 02/2104  . Long term (current) use of anticoagulants  warfarin for stroke risk reduction re: AF  . Vitamin D deficiency   . Hyperlipidemia   . Hx of colonic polyps     Adenomatous polyps  . Unstable gait   . Lack of coordination   . Other malaise and fatigue   . Muscle weakness (generalized)   .  Peripheral vascular disease, unspecified   . Varicose veins of lower extremities with ulcer   . Thrombocytopenia April 2015    144,000  . Deaf   . Shortness of breath 03/23/14   Past Surgical History  Procedure Laterality Date  . Cataract extraction w/ intraocular lens  implant, bilateral Bilateral      DATA REVIEWED  Radiologic Exams:   Cardiovascular Exams  05/2014 2D Echocardiogram: LVEF 65-70%. Biatrial enlargement. Mild- moderate MV regurgitation, Mild TV regurgitation. Mild -moderate pulmonary hypertention  Laboratory Studies Lab Results  Component Value Date   WBC 8.9 02/16/2014   HGB 14.1 02/16/2014   HCT 42 02/16/2014   PLT 192 02/16/2014   Lab Results  Component Value Date   NA 141 04/25/2014   K 3.7 04/25/2014   GLU 82 04/25/2014   BUN 82* 04/25/2014   CREATININE 0.8 04/25/2014   TSH  0.710      02/20/2014      Lab Results  Component Value Date   INR 2.5* 05/03/2014   INR 2.7* 04/12/2014   INR 2.0* 03/21/2014      REVIEW OF SYSTEMS  DATA OBTAINED: from patient, nurse , medical record,  GENERAL: Feels well   No recent fever, change in appetite or weight. Tires easily, is able to ambulate to the dining room twice daily, near independence with basic ADLs.   SKIN: "The rash is gone!"  EYES: No eye pain, dryness or itching  No change in vision EARS: No earache, tinnitus, "You know I am very HOH" RESPIRATORY: No cough, wheezing, SOB. Gets short of breath when she walks, this comes and goes.  CARDIAC: No chest pain, palpitations  No edema. NEUROLOGIC: No dizziness, fainting, headache,  No change in mental status.  PSYCHIATRIC: No feelings of anxiety, depression  Sleeps well.  No behavior issue.    PHYSICAL EXAM Filed Vitals:   05/26/14 1416  BP: 153/80  Pulse: 76  Temp: 97.5 F (36.4 C)  Resp: 18  Weight: 220 lb 6.4 oz (99.973 kg)  SpO2: 95%   Body mass index is 34.51 kg/(m^2).  GENERAL APPEARANCE: No acute distress, appropriately groomed, Overweight body  habitus. Alert, pleasant, conversant. SKIN: No diaphoresis. No rash. Left lower leg with multiple superficial wounds, varying sizes each with a mixture of granulation and pale exudate. There is evidence of new skin growth on the left anterior leg wound. Wounds on her right leg are confined to medial aspect of lower leg just above the ankle. The skin in this area appears to be very inflamed and is tender. HEAD: Normocephalic, atraumatic EYES: Conjunctiva/lids clear.  EARS: External exam WNL, significantly diminished hearing despite hearing aide RESPIRATORY: Breathing is mildly labored, mild audible upper airway wheeze. Lung sounds clear bilaterally.   CARDIOVASCULAR: Heart IRRR today. No murmur or extra heart sounds  VENOUS: Both lower legs with thickened , discolored skin c/w venous insufficiency  EDEMA: Bilateral lower legs with trace, non pitting edema, mid calves up to and including both thighs with significant non pitting edema, most evident posterior thighs. Patient is having difficulty flexing knees dur to posterior edema.  NEUROLOGIC: Oriented to time, place, person. Speech clear, no tremor.  PSYCHIATRIC: Mood and affect appropriate to situation   ASSESSMENT/PLAN  Macular rash Resolved  Lymphedema Edema of both lower extremities continues despite use of diuretic, compression of the lower legs and leg elevation. The edema is nonpitting. The tissues are very stiff and thick; consistent with lymphedema. The situation is contributing to poor healing of her lower extremity wounds. This patient would likely benefit from improved compression, possibly with sequential compression device.  Open wound of left lower leg Multiple open wounds of both lower extremities, left worse than right. These wounds have been present since prior to 03/16/2014. Wound care has included meticulous daily dressing changes, wound bed treatment with Santyl for debridement, lower extremity compression and elevation.  Perform some sharp debridement today to remove pale slough from most of the wounds. There is some evidence of good granulation tissue at the wound basis. Wound edges on the largest wound(anterior left lower leg) are smooth, several other wounds have rounded re epithelialized wound edges. These wounds have not made much progress over the last 2 months. Recommend further evaluation and treatment at the Wound Center. Discussed case briefly with Dr. Laroy Apple today, he accepts referral.    Family/ staff Communication:     Time:  60 minutes, >50% spent counseling/or care coordination   Goals of care:    Wound healing and maximize functional status  Labs/tests ordered:     Follow up: Return for As scheduled, INR.  Toribio Harbour, NP-C [facility] [phone]  05/26/2014

## 2014-05-26 NOTE — Assessment & Plan Note (Addendum)
Multiple open wounds of both lower extremities, left worse than right. These wounds have been present since prior to 03/16/2014. Wound care has included meticulous daily dressing changes, wound bed treatment with Santyl for debridement, lower extremity compression and elevation. Perform some sharp debridement today to remove pale slough from most of the wounds. There is some evidence of good granulation tissue at the wound basis. Wound edges on the largest wound(anterior left lower leg) are smooth, several other wounds have rounded re epithelialized wound edges. These wounds have not made much progress over the last 2 months. Recommend further evaluation and treatment at the Wound Center. Discussed case briefly with Dr. Laroy Apple today, he accepts referral.

## 2014-05-26 NOTE — Assessment & Plan Note (Signed)
Resolved

## 2014-05-29 ENCOUNTER — Encounter: Payer: Self-pay | Admitting: Geriatric Medicine

## 2014-05-31 ENCOUNTER — Non-Acute Institutional Stay: Payer: Medicare Other | Admitting: Geriatric Medicine

## 2014-05-31 ENCOUNTER — Encounter: Payer: Self-pay | Admitting: Geriatric Medicine

## 2014-05-31 VITALS — BP 130/64 | HR 76 | Wt 213.4 lb

## 2014-05-31 DIAGNOSIS — H9190 Unspecified hearing loss, unspecified ear: Secondary | ICD-10-CM

## 2014-05-31 DIAGNOSIS — I4891 Unspecified atrial fibrillation: Secondary | ICD-10-CM

## 2014-05-31 DIAGNOSIS — Z7901 Long term (current) use of anticoagulants: Secondary | ICD-10-CM

## 2014-05-31 NOTE — Progress Notes (Signed)
Patient ID: Brittany Schultz, female   DOB: 1922-02-25, 80 y.o.   MRN: [id number]   [facility] [phone])  Code Status: Full Code, Living Will, HCPOA  Contact Information   Name Relation Home Work Mobile   [place] Grandaughter [phone]     [name] Relative   [phone]      Chief Complaint  Patient presents with  . Medical Management of Chronic Issues    coag check     HPI: This is a 80 y.o. female resident of [facility], Assisted Living section evaluated today for anticoagulation management.   Recent visit 05/03/2014:  A-fib Heart an irregular rhythm today, rate is well-controlled. Irregular rhythm may be contributing to patient's shortness of breath. Schedule echocardiogram to evaluate heart function Long term (current) use of anticoagulants INR remains in therapeutic range, continue current warfarin dose. Repeat INR in 4 weeks   Since last visit echocardiogram was completed, did not show evidence of significant heart failure. Patient has continued taking warfarin as prescribed. No diet changes or new medications   Allergies  Allergen Reactions  . Sulfa Antibiotics Rash    Rash w/ sulfonomides furosemide, meloxicam  . Codeine   . Penicillins   . Sulfonylureas     MEDICATIONS -  Reviewed  DATA REVIEWED  Radiologic Exams:   Cardiovascular Exams  05/2014 2D Echocardiogram: LVEF 65-70%. Biatrial enlargement. Mild- moderate MV regurgitation, Mild TV regurgitation. Mild -moderate pulmonary hypertention  Laboratory Studies Lab Results  Component Value Date   WBC 8.9 02/16/2014   HGB 14.1 02/16/2014   HCT 42 02/16/2014   PLT 192 02/16/2014   Lab Results  Component Value Date   NA 141 04/25/2014   K 3.7 04/25/2014   GLU 82 04/25/2014   BUN 82* 04/25/2014   CREATININE 0.8 04/25/2014   TSH  0.710      02/20/2014      Lab Results  Component Value Date   INR 2.5* 05/03/2014   INR 2.7* 04/12/2014   INR 2.0* 03/21/2014       REVIEW OF SYSTEMS  DATA OBTAINED: from patient, nurse , medical record,  GENERAL: Feels well   No recent fever, change in appetite or weight. Is able to ambulate to the dining room with rest break, twice daily, near independence with basic ADLs.   SKIN: "The rash has not returned  EYES: No eye pain, dryness or itching  No change in vision EARS: No earache, tinnitus, "You know I am very HOH" Hearing aides "died" this week RESPIRATORY: No cough, wheezing, SOB. Gets short of breath when she walks, this comes and goes.  CARDIAC: No chest pain, palpitations  No edema. NEUROLOGIC: No dizziness, fainting, headache,  No change in mental status.  PSYCHIATRIC: No feelings of anxiety, depression  Sleeps well.  No behavior issue.    PHYSICAL EXAM Filed Vitals:   05/31/14 1424  BP: 130/64  Pulse: 76   Body mass index is 33.42 kg/(m^2).  GENERAL APPEARANCE: No acute distress, appropriately groomed, Overweight body habitus. Alert, pleasant, conversant. EYES: Conjunctiva/lids clear.  EARS: External exam WNL, significantly diminished hearing   RESPIRATORY: Breathing is not  labored, No audible upper airway wheeze. Lung sounds clear bilaterally.   CARDIOVASCULAR: Heart IRRR today. No murmur or extra heart sounds NEUROLOGIC: Oriented to time, place, person. Speech clear, no tremor.  PSYCHIATRIC: Mood and affect appropriate to situation   ASSESSMENT/PLAN  A-fib Heart remains in a regular rhythm, rate remains well-controlled. Continue current medications including anticoagulation.  Short of breath shortness of breath is improved with 10 pound weight loss over the last 2 weeks.   Deaf Patient's hearing aids are nonfunctional, she reports her granddaughter is assisting her to audiology appointment.  Long term (current) use of anticoagulants INR below therapeutic range today likely due to increased dose of spironolactone. Increase warfarin, repeat INR in 2 weeks    Family/ staff  Communication:     Time:    Goals of care:      Labs/tests ordered:     Follow up: Return in about 2 weeks (around 06/14/2014) for INR.  Toribio Harbour, NP-C [facility] [phone]  05/31/2014

## 2014-05-31 NOTE — Assessment & Plan Note (Signed)
Patient's hearing aids are nonfunctional, she reports her granddaughter is assisting her to audiology appointment.

## 2014-05-31 NOTE — Assessment & Plan Note (Signed)
Heart remains in a regular rhythm, rate remains well-controlled. Continue current medications including anticoagulation. Short of breath shortness of breath is improved with 10 pound weight loss over the last 2 weeks.

## 2014-05-31 NOTE — Assessment & Plan Note (Signed)
INR below therapeutic range today likely due to increased dose of spironolactone. Increase warfarin, repeat INR in 2 weeks

## 2014-06-01 ENCOUNTER — Encounter: Payer: Self-pay | Admitting: Geriatric Medicine

## 2014-06-08 ENCOUNTER — Encounter (HOSPITAL_BASED_OUTPATIENT_CLINIC_OR_DEPARTMENT_OTHER): Payer: Medicare Other | Attending: Internal Medicine

## 2014-06-08 DIAGNOSIS — E669 Obesity, unspecified: Secondary | ICD-10-CM | POA: Diagnosis not present

## 2014-06-08 DIAGNOSIS — I4891 Unspecified atrial fibrillation: Secondary | ICD-10-CM | POA: Diagnosis not present

## 2014-06-08 DIAGNOSIS — I2510 Atherosclerotic heart disease of native coronary artery without angina pectoris: Secondary | ICD-10-CM | POA: Diagnosis not present

## 2014-06-08 DIAGNOSIS — Z7901 Long term (current) use of anticoagulants: Secondary | ICD-10-CM | POA: Insufficient documentation

## 2014-06-08 DIAGNOSIS — I890 Lymphedema, not elsewhere classified: Secondary | ICD-10-CM | POA: Diagnosis not present

## 2014-06-08 DIAGNOSIS — E039 Hypothyroidism, unspecified: Secondary | ICD-10-CM | POA: Diagnosis not present

## 2014-06-08 DIAGNOSIS — Z79899 Other long term (current) drug therapy: Secondary | ICD-10-CM | POA: Diagnosis not present

## 2014-06-08 DIAGNOSIS — I509 Heart failure, unspecified: Secondary | ICD-10-CM | POA: Insufficient documentation

## 2014-06-08 DIAGNOSIS — L03119 Cellulitis of unspecified part of limb: Secondary | ICD-10-CM

## 2014-06-08 DIAGNOSIS — L97809 Non-pressure chronic ulcer of other part of unspecified lower leg with unspecified severity: Secondary | ICD-10-CM | POA: Diagnosis present

## 2014-06-08 DIAGNOSIS — I739 Peripheral vascular disease, unspecified: Secondary | ICD-10-CM | POA: Insufficient documentation

## 2014-06-08 DIAGNOSIS — Z9181 History of falling: Secondary | ICD-10-CM | POA: Insufficient documentation

## 2014-06-08 DIAGNOSIS — L02419 Cutaneous abscess of limb, unspecified: Secondary | ICD-10-CM | POA: Diagnosis not present

## 2014-06-08 DIAGNOSIS — I10 Essential (primary) hypertension: Secondary | ICD-10-CM | POA: Diagnosis not present

## 2014-06-09 NOTE — Progress Notes (Signed)
[facility]  NAME:  Brittany Schultz NO.:  [id number]  MEDICAL RECORD NO.:  [id number]      DATE OF BIRTH:  [birth year]  PHYSICIAN:  Ardath Sax, M.D.           VISIT DATE:                                  OFFICE VISIT   HISTORY OF PRESENT ILLNESS:  Brittany Schultz is an 80 year old Caucasian female, who enters because of severe problems with bilateral lymphedema of both of her legs, and she has venous ulcers with inflammation.  She also has a surrounding cellulitis in her upper calves.  She has a history of many problems including congestive heart failure, hypothyroidism, hypertension, osteoarthritis, atrial fibrillation, peripheral vascular disease, unstable gait, and frequent falls.  She is on many medicines including Coreg, Temovate, Neurontin, Atarax, Synthroid, MiraLAX, Deltasone, Senokot, Aldactone, and she is also on Coumadin because of her AFib.  Today, her vital signs were relatively normal.  Blood pressure is 128/80, pulse 70, respirations 16, temperature 98.  She is obese, weighs 215 pounds.  In examining her, she really has marked lymphedema and needs home lymphedema pumps.  She has a surrounding cellulitis, so I put her on doxycycline and Unna boots with compression and advised to keep her legs elevated.  We will plan on seeing her again next week and hopefully we can see about her getting home lymphedema pumps.  DIAGNOSES: 1. Coronary artery disease. 2. Congestive heart failure. 3. Hypothyroidism. 4. Bilateral lymphedematous legs with venous ulcers and inflammation. 5. Obesity.     Ardath Sax, M.D.     PP/MEDQ  D:  06/08/2014  T:  06/09/2014  Job:  [id number]

## 2014-06-14 ENCOUNTER — Encounter (HOSPITAL_BASED_OUTPATIENT_CLINIC_OR_DEPARTMENT_OTHER): Payer: Medicare Other | Attending: General Surgery

## 2014-06-14 DIAGNOSIS — L97809 Non-pressure chronic ulcer of other part of unspecified lower leg with unspecified severity: Secondary | ICD-10-CM | POA: Insufficient documentation

## 2014-06-14 DIAGNOSIS — I890 Lymphedema, not elsewhere classified: Secondary | ICD-10-CM | POA: Diagnosis not present

## 2014-06-21 ENCOUNTER — Non-Acute Institutional Stay: Payer: Medicare Other | Admitting: Nurse Practitioner

## 2014-06-21 ENCOUNTER — Encounter: Payer: Self-pay | Admitting: Nurse Practitioner

## 2014-06-21 VITALS — BP 148/72 | HR 72 | Wt 215.0 lb

## 2014-06-21 DIAGNOSIS — Z7901 Long term (current) use of anticoagulants: Secondary | ICD-10-CM

## 2014-06-21 DIAGNOSIS — R21 Rash and other nonspecific skin eruption: Secondary | ICD-10-CM

## 2014-06-21 DIAGNOSIS — I4891 Unspecified atrial fibrillation: Secondary | ICD-10-CM

## 2014-06-21 DIAGNOSIS — I482 Chronic atrial fibrillation, unspecified: Secondary | ICD-10-CM

## 2014-06-21 LAB — POCT INR: INR: 1.9 — AB (ref 0.9–1.1)

## 2014-06-21 MED ORDER — WARFARIN SODIUM 2.5 MG PO TABS: ORAL_TABLET | ORAL | Status: DC

## 2014-06-21 MED ORDER — HYDROXYZINE PAMOATE 25 MG PO CAPS: 25.0000 mg | ORAL_CAPSULE | Freq: Four times a day (QID) | ORAL | Status: DC | PRN

## 2014-06-21 NOTE — Progress Notes (Signed)
Patient ID: Brittany Schultz, female   DOB: 04/15/1922, 80 y.o.   MRN: [id number] Patient ID: Brittany Schultz, female   DOB: 08-08-1922, 80 y.o.   MRN: [id number]   [facility] [phone])  Code Status: Full Code, Living Will, HCPOA  Contact Information   Name Relation Home Work Mobile   Brittany Schultz Grandaughter [phone]     Brittany Schultz Relative   [phone]      Chief Complaint  Patient presents with  . Medical Management of Chronic Issues    coag check     HPI: This is a 80 y.o. female resident of [facility] evaluated today for anticoagulation management.  Pt conts to follow up at wound clinic for bilateral LE ulcers, reports slow healing and currently wearing unna boots Reports faint rash is back Itching has been progressively getting worse over the past month and causing more bruising due to excessive itching.  There has been no changes in diet or medication, taking medication as prescribed.    Allergies  Allergen Reactions  . Sulfa Antibiotics Rash    Rash w/ sulfonomides furosemide, meloxicam  . Codeine   . Penicillins   . Sulfonylureas     Current Outpatient Prescriptions on File Prior to Visit  Medication Sig Dispense Refill  . acetaminophen (TYLENOL) 325 MG tablet Take 2 tablets (650 mg total) by mouth 2 (two) times daily. Mat have additional 650mg  PO q 6hr as needed for pain      . carvedilol (COREG) 6.25 MG tablet Take 6.25 mg by mouth 2 (two) times daily with a meal.      . clobetasol ointment (TEMOVATE) 0.05 % Apply 1 application topically 2 (two) times daily. Apply oint to skin twice daily for itching      . gabapentin (NEURONTIN) 100 MG capsule Take 100 mg by mouth at bedtime.      . hydrOXYzine (ATARAX/VISTARIL) 25 MG tablet Take 1 tablet (25 mg total) by mouth every 8 (eight) hours as needed.  30 tablet    . levothyroxine (SYNTHROID, LEVOTHROID) 125 MCG tablet Take 125 mcg by mouth daily  before breakfast.      . mineral oil-hydrophilic petrolatum (AQUAPHOR) ointment Apply 1 application topically daily.      . NONFORMULARY OR COMPOUNDED ITEM Take 1 capsule by mouth 2 (two) times daily. Takes GARDEN BLEND; VINEYARD BLEND; ORCHARD BLEND; juice plus vitamins twice daily      . NONFORMULARY OR COMPOUNDED ITEM Take 1 tablet by mouth 2 (two) times daily.      . NONFORMULARY OR COMPOUNDED ITEM Take 1 tablet by mouth daily. [employer]      . polyethylene glycol (MIRALAX / GLYCOLAX) packet Take 17 g by mouth daily as needed.      . sennosides-docusate sodium (SENOKOT-S) 8.6-50 MG tablet Take 1 tablet by mouth daily as needed for constipation.       03-23-1979 spironolactone (ALDACTONE) 25 MG tablet Take 1 tablet (25 mg total) by mouth daily.      Marland Kitchen warfarin (COUMADIN) 2.5 MG tablet Take 2.5 mg by mouth. Daily except Wed - Friday take 3mg        No current facility-administered medications on file prior to visit.     Cardiovascular Exams  05/2014 2D Echocardiogram: LVEF 65-70%. Biatrial enlargement. Mild- moderate MV regurgitation, Mild TV regurgitation. Mild -moderate pulmonary hypertention  Laboratory Studies Lab Results  Component Value Date   WBC 8.9 02/16/2014   HGB 14.1 02/16/2014  HCT 42 02/16/2014   PLT 192 02/16/2014   Lab Results  Component Value Date   NA 141 04/25/2014   K 3.7 04/25/2014   GLU 82 04/25/2014   BUN 82* 04/25/2014   CREATININE 0.8 04/25/2014   TSH  0.710      02/20/2014      Lab Results  Component Value Date   INR 1.9* 06/21/2014   INR 2.5* 05/03/2014   INR 2.7* 04/12/2014      REVIEW OF SYSTEMS  DATA OBTAINED: from patient, nurse , medical record,  GENERAL: Feels well   No recent fever, change in appetite or weight.   SKIN: more itchy   EYES: No eye pain, dryness or itching  No change in vision EARS: No earache, tinnitus, HOH RESPIRATORY: No cough, wheezing, SOB. Gets short of breath when she walks, this comes and goes.  CARDIAC: No chest  pain, palpitations  No edema. NEUROLOGIC: No dizziness, fainting, headache,  No change in mental status.  PSYCHIATRIC: No feelings of anxiety, depression  Sleeps well.  No behavior issue.    PHYSICAL EXAM Filed Vitals:   06/21/14 1329  BP: 148/72  Pulse: 72  Weight: 215 lb (97.523 kg)   Body mass index is 33.67 kg/(m^2).  GENERAL APPEARANCE: No acute distress, appropriately groomed, Overweight body habitus. Alert, pleasant, conversant. SKIN: faint macular rash to back and chest EYES: Conjunctiva/lids clear.  EARS: External exam WNL, significantly diminished hearing   RESPIRATORY: Breathing is not  labored, No audible upper airway wheeze. Lung sounds clear bilaterally.   CARDIOVASCULAR: Heart IRRR today. No murmur or extra heart sounds NEUROLOGIC: Oriented to time, place, person. Speech clear, no tremor.  PSYCHIATRIC: Mood and affect appropriate to situation   ASSESSMENT/PLAN  1. Chronic atrial fibrillation Rate well controlled with current medications, will cont current regimen   2. Macular rash Will have pt take vistaril 25 mg q 8 hours as needed for itch, will provide on-going moniting for rash if needed will refer back to dermatologist and may need to schedule vistaril for itch  3. Long term (current) use of anticoagulants -will increase coumadin to Take 3 mg Saturday, Sunday, Tuesday and Thursday and take 2.5 on M, W, F  -will recheck INR in 2 weeks

## 2014-06-22 ENCOUNTER — Encounter: Payer: Self-pay | Admitting: Internal Medicine

## 2014-06-22 DIAGNOSIS — I890 Lymphedema, not elsewhere classified: Secondary | ICD-10-CM | POA: Diagnosis not present

## 2014-06-22 DIAGNOSIS — L97809 Non-pressure chronic ulcer of other part of unspecified lower leg with unspecified severity: Secondary | ICD-10-CM | POA: Diagnosis not present

## 2014-06-29 DIAGNOSIS — I890 Lymphedema, not elsewhere classified: Secondary | ICD-10-CM | POA: Diagnosis not present

## 2014-06-29 DIAGNOSIS — L97809 Non-pressure chronic ulcer of other part of unspecified lower leg with unspecified severity: Secondary | ICD-10-CM | POA: Diagnosis not present

## 2014-07-05 ENCOUNTER — Encounter: Payer: Self-pay | Admitting: Nurse Practitioner

## 2014-07-05 ENCOUNTER — Non-Acute Institutional Stay: Payer: Medicare Other | Admitting: Nurse Practitioner

## 2014-07-05 VITALS — BP 120/70 | HR 54

## 2014-07-05 DIAGNOSIS — L97909 Non-pressure chronic ulcer of unspecified part of unspecified lower leg with unspecified severity: Secondary | ICD-10-CM

## 2014-07-05 DIAGNOSIS — I4891 Unspecified atrial fibrillation: Secondary | ICD-10-CM

## 2014-07-05 DIAGNOSIS — I83009 Varicose veins of unspecified lower extremity with ulcer of unspecified site: Secondary | ICD-10-CM

## 2014-07-05 DIAGNOSIS — I482 Chronic atrial fibrillation, unspecified: Secondary | ICD-10-CM

## 2014-07-05 DIAGNOSIS — Z7901 Long term (current) use of anticoagulants: Secondary | ICD-10-CM

## 2014-07-05 DIAGNOSIS — L97929 Non-pressure chronic ulcer of unspecified part of left lower leg with unspecified severity: Secondary | ICD-10-CM

## 2014-07-05 DIAGNOSIS — I83029 Varicose veins of left lower extremity with ulcer of unspecified site: Secondary | ICD-10-CM

## 2014-07-05 LAB — POCT INR: INR: 2.8 — AB (ref 0.9–1.1)

## 2014-07-05 NOTE — Progress Notes (Signed)
Patient ID: Brittany Schultz, female   DOB: 1922-08-08, 80 y.o.   MRN: [id number]   [facility] [phone])  Code Status: Full Code, Living Will, HCPOA  Contact Information   Name Relation Home Work Mobile   [place] Grandaughter [phone]     [name] Relative   [phone]      Chief Complaint  Patient presents with  . Medical Management of Chronic Issues    coag check     HPI: This is a 80 y.o. female resident of [facility], Assisted Living section evaluated today for anticoagulation management. Patient has continued taking warfarin as prescribed. No diet changes or new medications. Since last visit conts to see wound care for bilaterally LE ulcers, currently wrapped today   Allergies  Allergen Reactions  . Sulfa Antibiotics Rash    Rash w/ sulfonomides furosemide, meloxicam  . Codeine   . Penicillins   . Sulfonylureas     MEDICATIONS    Medication List       This list is accurate as of: 07/05/14  2:42 PM.  Always use your most recent med list.               acetaminophen 325 MG tablet  Commonly known as:  TYLENOL  Take 2 tablets (650 mg total) by mouth 2 (two) times daily. Mat have additional 650mg  PO q 6hr as needed for pain     carvedilol 6.25 MG tablet  Commonly known as:  COREG  Take 6.25 mg by mouth 2 (two) times daily with a meal.     cholecalciferol 1000 UNITS tablet  Commonly known as:  VITAMIN D  Take 1,000 Units by mouth daily. Take 2,000 units daily     clobetasol ointment 0.05 %  Commonly known as:  TEMOVATE  Apply 1 application topically 2 (two) times daily. Apply oint to skin twice daily for itching     gabapentin 100 MG capsule  Commonly known as:  NEURONTIN  Take 100 mg by mouth at bedtime.     hydrOXYzine 25 MG capsule  Commonly known as:  VISTARIL  Take 1 capsule (25 mg total) by mouth every 6 (six) hours as needed for itching.     hydrOXYzine 25 MG tablet  Commonly known as:   ATARAX/VISTARIL  Take 1 tablet (25 mg total) by mouth every 8 (eight) hours as needed.     levothyroxine 125 MCG tablet  Commonly known as:  SYNTHROID, LEVOTHROID  Take 125 mcg by mouth daily before breakfast.     mineral oil-hydrophilic petrolatum ointment  Apply 1 application topically daily.     NONFORMULARY OR COMPOUNDED ITEM  Take 1 capsule by mouth 2 (two) times daily. Takes GARDEN BLEND; VINEYARD BLEND; ORCHARD BLEND; juice plus vitamins twice daily     NONFORMULARY OR COMPOUNDED ITEM  Take 1 tablet by mouth 2 (two) times daily. Orchard Blend     NONFORMULARY OR COMPOUNDED ITEM  Take 1 tablet by mouth daily. Vineyard Blend     polyethylene glycol packet  Commonly known as:  MIRALAX / GLYCOLAX  Take 17 g by mouth daily as needed.     spironolactone 25 MG tablet  Commonly known as:  ALDACTONE  Take 1 tablet (25 mg total) by mouth daily.     warfarin 2.5 MG tablet  Commonly known as:  COUMADIN  Take 3 mg Saturday, Sunday, Tuesday and Thursday and take 2.5 on M, W, F  DATA REVIEWED  Radiologic Exams:   Cardiovascular Exams  05/2014 2D Echocardiogram: LVEF 65-70%. Biatrial enlargement. Mild- moderate MV regurgitation, Mild TV regurgitation. Mild -moderate pulmonary hypertention  Laboratory Studies Lab Results  Component Value Date   WBC 8.9 02/16/2014   HGB 14.1 02/16/2014   HCT 42 02/16/2014   PLT 192 02/16/2014   Lab Results  Component Value Date   NA 139 05/11/2014   K 3.8 05/11/2014   GLU 79 05/11/2014   BUN 19 05/11/2014   CREATININE 0.8 05/11/2014   TSH  0.710      02/20/2014      Lab Results  Component Value Date   INR 2.8* 07/05/2014   INR 1.9* 06/21/2014   INR 2.5* 05/03/2014      REVIEW OF SYSTEMS  DATA OBTAINED: from patient, nurse , medical record,  GENERAL: Feels well   No recent fever, change in appetite or weight.   SKIN: The rash has not returned, itching still there but not bad Chronic LE wounds being managed at wound care center   EYES: No eye pain, dryness or itching  No change in vision EARS: No earache, tinnitus, HOH RESPIRATORY: No cough, wheezing, SOB. Gets short of breath when she walks, this comes and goes and is unchanged.  CARDIAC: No chest pain, palpitations   Edema noted to lower ext. NEUROLOGIC: No dizziness, fainting, headache,  No change in mental status.  PSYCHIATRIC: No feelings of anxiety, depression  Sleeps well.  No behavior issue.    PHYSICAL EXAM Filed Vitals:   07/05/14 1418  BP: 120/70  Pulse: 54  SpO2: 99%   Body mass index is 0.00 kg/(m^2).  GENERAL APPEARANCE: No acute distress, appropriately groomed, Overweight body habitus. Alert, pleasant, conversant. EYES: Conjunctiva/lids clear.  EARS: External exam WNL, significantly diminished hearing   RESPIRATORY: Breathing is not  labored, No audible upper airway wheeze. Lung sounds clear bilaterally.   CARDIOVASCULAR: Heart IRRR today. No murmur or extra heart sounds NEUROLOGIC: Oriented to time, place, person. Speech clear, no tremor.  PSYCHIATRIC: Mood and affect appropriate to situation Skin: no rash or lesions noted, bilaterally LE wrapped  ASSESSMENT/PLAN  1. Chronic atrial fibrillation Heart in regular rhythm today, continue current medication including anticoagulation  2. Venous stasis ulcer of left lower extremity Multiple open wounds of both lower extremity currently following with wound center at this time.  3. Long term (current) use of anticoagulants INR in therapeutic range at 2.8, continue current dose dose of warfarin. Next INR in 3 weeks

## 2014-07-06 DIAGNOSIS — L97809 Non-pressure chronic ulcer of other part of unspecified lower leg with unspecified severity: Secondary | ICD-10-CM | POA: Diagnosis not present

## 2014-07-06 DIAGNOSIS — I890 Lymphedema, not elsewhere classified: Secondary | ICD-10-CM | POA: Diagnosis not present

## 2014-07-06 NOTE — Addendum Note (Signed)
Addended by: Claudie Revering on: 07/06/2014 12:20 PM   Modules accepted: Level of Service

## 2014-07-12 DIAGNOSIS — L97809 Non-pressure chronic ulcer of other part of unspecified lower leg with unspecified severity: Secondary | ICD-10-CM | POA: Diagnosis not present

## 2014-07-12 DIAGNOSIS — I890 Lymphedema, not elsewhere classified: Secondary | ICD-10-CM | POA: Diagnosis not present

## 2014-07-17 ENCOUNTER — Other Ambulatory Visit: Payer: Self-pay | Admitting: *Deleted

## 2014-07-17 DIAGNOSIS — I83225 Varicose veins of left lower extremity with both ulcer other part of foot and inflammation: Principal | ICD-10-CM

## 2014-07-17 DIAGNOSIS — I83221 Varicose veins of left lower extremity with both ulcer of thigh and inflammation: Secondary | ICD-10-CM

## 2014-07-17 DIAGNOSIS — I83229 Varicose veins of left lower extremity with both ulcer of unspecified site and inflammation: Principal | ICD-10-CM

## 2014-07-17 DIAGNOSIS — I83224 Varicose veins of left lower extremity with both ulcer of heel and midfoot and inflammation: Principal | ICD-10-CM

## 2014-07-17 DIAGNOSIS — I83223 Varicose veins of left lower extremity with both ulcer of ankle and inflammation: Principal | ICD-10-CM

## 2014-07-17 DIAGNOSIS — I83222 Varicose veins of left lower extremity with both ulcer of calf and inflammation: Principal | ICD-10-CM

## 2014-07-17 DIAGNOSIS — I83228 Varicose veins of left lower extremity with both ulcer of other part of lower extremity and inflammation: Principal | ICD-10-CM

## 2014-07-20 ENCOUNTER — Encounter (HOSPITAL_BASED_OUTPATIENT_CLINIC_OR_DEPARTMENT_OTHER): Payer: Medicare Other | Attending: General Surgery

## 2014-07-20 DIAGNOSIS — I890 Lymphedema, not elsewhere classified: Secondary | ICD-10-CM | POA: Insufficient documentation

## 2014-07-20 DIAGNOSIS — L97809 Non-pressure chronic ulcer of other part of unspecified lower leg with unspecified severity: Secondary | ICD-10-CM | POA: Diagnosis present

## 2014-07-26 ENCOUNTER — Encounter: Payer: Self-pay | Admitting: Nurse Practitioner

## 2014-07-26 ENCOUNTER — Non-Acute Institutional Stay: Payer: Medicare Other | Admitting: Nurse Practitioner

## 2014-07-26 VITALS — BP 142/84 | HR 68

## 2014-07-26 DIAGNOSIS — L851 Acquired keratosis [keratoderma] palmaris et plantaris: Secondary | ICD-10-CM

## 2014-07-26 DIAGNOSIS — I482 Chronic atrial fibrillation, unspecified: Secondary | ICD-10-CM

## 2014-07-26 DIAGNOSIS — L853 Xerosis cutis: Secondary | ICD-10-CM

## 2014-07-26 DIAGNOSIS — Z7901 Long term (current) use of anticoagulants: Secondary | ICD-10-CM

## 2014-07-26 DIAGNOSIS — I4891 Unspecified atrial fibrillation: Secondary | ICD-10-CM

## 2014-07-26 LAB — POCT INR: INR: 2.8 — AB (ref 0.9–1.1)

## 2014-07-26 NOTE — Progress Notes (Signed)
Patient ID: Brittany Schultz, female   DOB: 09-10-1922, 80 y.o.   MRN: [id number]   [facility] [phone])  Code Status: Full Code, Living Will, HCPOA      Contact Information   Name Relation Home Work Mobile   Friend Grandaughter [phone]     [name] Relative   [phone]      Chief Complaint  Patient presents with  . Medical Management of Chronic Issues    coag check     HPI: This is a 80 y.o. female resident of [facility], Assisted [facility] section evaluated today for anticoagulation management. Patient has continued taking warfarin as prescribed. No diet changes or new medications. Reports on-going itch- no rash. Only happens occasionally   Allergies  Allergen Reactions  . Sulfa Antibiotics Rash    Rash w/ sulfonomides furosemide, meloxicam  . Codeine   . Penicillins   . Sulfonylureas     MEDICATIONS    Medication List       This list is accurate as of: 07/26/14  9:17 AM.  Always use your most recent med list.               acetaminophen 325 MG tablet  Commonly known as:  TYLENOL  Take 2 tablets (650 mg total) by mouth 2 (two) times daily. Mat have additional 650mg  PO q 6hr as needed for pain     carvedilol 6.25 MG tablet  Commonly known as:  COREG  Take 6.25 mg by mouth 2 (two) times daily with a meal.     cholecalciferol 1000 UNITS tablet  Commonly known as:  VITAMIN D  Take 1,000 Units by mouth daily. Take 2,000 units daily     clobetasol ointment 0.05 %  Commonly known as:  TEMOVATE  Apply 1 application topically 2 (two) times daily. Apply oint to skin twice daily for itching     gabapentin 100 MG capsule  Commonly known as:  NEURONTIN  Take 100 mg by mouth at bedtime.     hydrOXYzine 25 MG capsule  Commonly known as:  VISTARIL  Take 1 capsule (25 mg total) by mouth every 6 (six) hours as needed for itching.     hydrOXYzine 25 MG tablet  Commonly known as:  ATARAX/VISTARIL  Take 1  tablet (25 mg total) by mouth every 8 (eight) hours as needed.     levothyroxine 125 MCG tablet  Commonly known as:  SYNTHROID, LEVOTHROID  Take 125 mcg by mouth daily before breakfast.     mineral oil-hydrophilic petrolatum ointment  Apply 1 application topically daily.     NONFORMULARY OR COMPOUNDED ITEM  Take 1 capsule by mouth 2 (two) times daily. Takes GARDEN BLEND; VINEYARD BLEND; ORCHARD BLEND; juice plus vitamins twice daily     NONFORMULARY OR COMPOUNDED ITEM  Take 1 tablet by mouth 2 (two) times daily. Orchard Blend     NONFORMULARY OR COMPOUNDED ITEM  Take 1 tablet by mouth daily. Vineyard Blend     polyethylene glycol packet  Commonly known as:  MIRALAX / GLYCOLAX  Take 17 g by mouth daily as needed.     spironolactone 25 MG tablet  Commonly known as:  ALDACTONE  Take 1 tablet (25 mg total) by mouth daily.     warfarin 2.5 MG tablet  Commonly known as:  COUMADIN  Take 3 mg Saturday, Sunday, Tuesday and Thursday and take 2.5 on M, W, F        Cardiovascular Exams  05/2014  2D Echocardiogram: LVEF 65-70%. Biatrial enlargement. Mild- moderate MV regurgitation, Mild TV regurgitation. Mild -moderate pulmonary hypertention  Laboratory Studies Lab Results  Component Value Date   WBC 8.9 02/16/2014   HGB 14.1 02/16/2014   HCT 42 02/16/2014   PLT 192 02/16/2014   Lab Results  Component Value Date   NA 139 05/11/2014   K 3.8 05/11/2014   GLU 79 05/11/2014   BUN 19 05/11/2014   CREATININE 0.8 05/11/2014   TSH  0.710      02/20/2014      Lab Results  Component Value Date   INR 2.8* 07/05/2014   INR 1.9* 06/21/2014   INR 2.5* 05/03/2014      REVIEW OF SYSTEMS  DATA OBTAINED: from patient, nurse , medical record,  GENERAL: Feels well   No recent fever, change in appetite or weight.   SKIN:occasional itching  EYES: No eye pain, dryness or itching  No change in vision EARS: No earache, tinnitus, HOH RESPIRATORY: No cough, wheezing, SOB. Gets short of breath when she  walks, this comes and goes and is unchanged.  CARDIAC: No chest pain, palpitations   Edema noted to lower ext. NEUROLOGIC: No dizziness, fainting, headache,  No change in mental status.  PSYCHIATRIC: No feelings of anxiety, depression  Sleeps well.  No behavior issue.    PHYSICAL EXAM Filed Vitals:   07/26/14 0910  BP: 142/84  Pulse: 68   Body mass index is 0.00 kg/(m^2).  GENERAL APPEARANCE: No acute distress, appropriately groomed, Overweight body habitus. Alert, pleasant, conversant. EYES: Conjunctiva/lids clear.  EARS: External exam WNL, significantly diminished hearing   RESPIRATORY: Breathing is not  labored, No audible upper airway wheeze. Lung sounds clear bilaterally.   CARDIOVASCULAR: Heart RRR. No murmur or extra heart sounds NEUROLOGIC: Oriented to time, place, person. Speech clear, no tremor.  PSYCHIATRIC: Mood and affect appropriate to situation Skin: no rash or lesions noted, bilaterally LE , dry flaky skin  ASSESSMENT/PLAN  1. Chronic atrial fibrillation Heart in regular rhythm today, continue current medication including coumadin  2. Long term (current) use of anticoagulants INR in therapeutic range, continue current dose dose of warfarin. Next INR in 4 weeks   3. Dry skin -will have staff apply Eucerin cream to skin daily after bath

## 2014-07-27 DIAGNOSIS — I890 Lymphedema, not elsewhere classified: Secondary | ICD-10-CM | POA: Diagnosis not present

## 2014-07-27 DIAGNOSIS — L97809 Non-pressure chronic ulcer of other part of unspecified lower leg with unspecified severity: Secondary | ICD-10-CM | POA: Diagnosis not present

## 2014-08-02 DIAGNOSIS — L97809 Non-pressure chronic ulcer of other part of unspecified lower leg with unspecified severity: Secondary | ICD-10-CM | POA: Diagnosis not present

## 2014-08-02 DIAGNOSIS — I890 Lymphedema, not elsewhere classified: Secondary | ICD-10-CM | POA: Diagnosis not present

## 2014-08-10 DIAGNOSIS — L97809 Non-pressure chronic ulcer of other part of unspecified lower leg with unspecified severity: Secondary | ICD-10-CM | POA: Diagnosis not present

## 2014-08-10 DIAGNOSIS — I890 Lymphedema, not elsewhere classified: Secondary | ICD-10-CM | POA: Diagnosis not present

## 2014-08-14 ENCOUNTER — Encounter: Payer: Self-pay | Admitting: Vascular Surgery

## 2014-08-15 ENCOUNTER — Ambulatory Visit (INDEPENDENT_AMBULATORY_CARE_PROVIDER_SITE_OTHER): Payer: Medicare Other | Admitting: Vascular Surgery

## 2014-08-15 ENCOUNTER — Ambulatory Visit (HOSPITAL_COMMUNITY)
Admission: RE | Admit: 2014-08-15 | Discharge: 2014-08-15 | Disposition: A | Discharge status: Home / Self Care | Payer: Medicare Other | Source: Ambulatory Visit | Attending: Vascular Surgery | Admitting: Vascular Surgery

## 2014-08-15 ENCOUNTER — Encounter: Payer: Self-pay | Admitting: Vascular Surgery

## 2014-08-15 VITALS — BP 157/67 | HR 73 | Resp 16 | Ht 68.0 in | Wt 210.0 lb

## 2014-08-15 DIAGNOSIS — I83229 Varicose veins of left lower extremity with both ulcer of unspecified site and inflammation: Secondary | ICD-10-CM

## 2014-08-15 DIAGNOSIS — I83224 Varicose veins of left lower extremity with both ulcer of heel and midfoot and inflammation: Secondary | ICD-10-CM

## 2014-08-15 DIAGNOSIS — L97929 Non-pressure chronic ulcer of unspecified part of left lower leg with unspecified severity: Secondary | ICD-10-CM

## 2014-08-15 DIAGNOSIS — I83221 Varicose veins of left lower extremity with both ulcer of thigh and inflammation: Secondary | ICD-10-CM

## 2014-08-15 DIAGNOSIS — I872 Venous insufficiency (chronic) (peripheral): Secondary | ICD-10-CM | POA: Insufficient documentation

## 2014-08-15 DIAGNOSIS — I83225 Varicose veins of left lower extremity with both ulcer other part of foot and inflammation: Secondary | ICD-10-CM

## 2014-08-15 DIAGNOSIS — I83223 Varicose veins of left lower extremity with both ulcer of ankle and inflammation: Secondary | ICD-10-CM

## 2014-08-15 DIAGNOSIS — I83209 Varicose veins of unspecified lower extremity with both ulcer of unspecified site and inflammation: Secondary | ICD-10-CM

## 2014-08-15 DIAGNOSIS — I83219 Varicose veins of right lower extremity with both ulcer of unspecified site and inflammation: Secondary | ICD-10-CM

## 2014-08-15 DIAGNOSIS — I83222 Varicose veins of left lower extremity with both ulcer of calf and inflammation: Secondary | ICD-10-CM

## 2014-08-15 DIAGNOSIS — L97919 Non-pressure chronic ulcer of unspecified part of right lower leg with unspecified severity: Secondary | ICD-10-CM

## 2014-08-15 DIAGNOSIS — I83228 Varicose veins of left lower extremity with both ulcer of other part of lower extremity and inflammation: Secondary | ICD-10-CM

## 2014-08-15 NOTE — Progress Notes (Signed)
Subjective:     Patient ID: Brittany Schultz, female   DOB: 30-Aug-1922, 80 y.o.   MRN: [id number]  HPI this 80 year old female is referred from the wound center to evaluate bilateral stasis ulcers possibly secondary to venous insufficiency. Patient is accompanied by her granddaughter. Patient currently resides at [facility]. She has had previous vascular surgery evaluation by Dr. Lowell Guitar in the past patient has had significant improvement in the ulcers in both legs since beginning lymphedema pump treatment 10 days ago. She is getting frequent dressing changes and wraps of her lower extremities. She has no history of previous laser ablation treatment for DVT.  Past Medical History  Diagnosis Date  . CHF (congestive heart failure)   . Unspecified hypothyroidism   . Hypertension   . Osteoarthritis of left hip   . A-fib     hospitalized 04/2009  . PVD (peripheral vascular disease)   . Venous stasis ulcer of left lower extremity   . Venous insufficiency of both lower extremities     vascular evaluation 10/2013, 01/2014 Dr.Powell  . Dermatitis 01/2014  . History of shingles   . Open wound of left lower leg 02/2104  . Long term (current) use of anticoagulants     warfarin for stroke risk reduction re: AF  . Vitamin D deficiency   . Hyperlipidemia   . Hx of colonic polyps     Adenomatous polyps  . Unstable gait   . Lack of coordination   . Other malaise and fatigue   . Muscle weakness (generalized)   . Peripheral vascular disease, unspecified   . Varicose veins of lower extremities with ulcer   . Thrombocytopenia April 2015    144,000  . Deaf   . Shortness of breath 03/23/14    History  Substance Use Topics  . Smoking status: Never Smoker   . Smokeless tobacco: Never Used  . Alcohol Use: No    Family History  Problem Relation Age of Onset  . Hypertension Mother   . Stroke Mother   . Heart disease Mother   . Hypertension Father   . Hypertension Brother     Allergies  Allergen  Reactions  . Sulfa Antibiotics Rash    Rash w/ sulfonomides furosemide, meloxicam  . Codeine   . Penicillins   . Sulfonylureas     Current outpatient prescriptions:acetaminophen (TYLENOL) 325 MG tablet, Take 2 tablets (650 mg total) by mouth 2 (two) times daily. Mat have additional 650mg  PO q 6hr as needed for pain, Disp: , Rfl: ;  carvedilol (COREG) 6.25 MG tablet, Take 6.25 mg by mouth 2 (two) times daily with a meal., Disp: , Rfl: ;  cholecalciferol (VITAMIN D) 1000 UNITS tablet, Take 1,000 Units by mouth daily. Take 2,000 units daily, Disp: , Rfl:  clobetasol ointment (TEMOVATE) 0.05 %, Apply 1 application topically 2 (two) times daily. Apply oint to skin twice daily for itching, Disp: , Rfl: ;  gabapentin (NEURONTIN) 100 MG capsule, Take 100 mg by mouth at bedtime., Disp: , Rfl: ;  hydrOXYzine (ATARAX/VISTARIL) 25 MG tablet, Take 1 tablet (25 mg total) by mouth every 8 (eight) hours as needed., Disp: 30 tablet, Rfl:  hydrOXYzine (VISTARIL) 25 MG capsule, Take 1 capsule (25 mg total) by mouth every 6 (six) hours as needed for itching., Disp: 30 capsule, Rfl: 0;  levothyroxine (SYNTHROID, LEVOTHROID) 125 MCG tablet, Take 125 mcg by mouth daily before breakfast., Disp: , Rfl: ;  mineral oil-hydrophilic petrolatum (AQUAPHOR) ointment, Apply 1 application topically daily.,  Disp: , Rfl:  NONFORMULARY OR COMPOUNDED ITEM, Take 1 capsule by mouth 2 (two) times daily. Takes GARDEN BLEND; VINEYARD BLEND; ORCHARD BLEND; juice plus vitamins twice daily, Disp: , Rfl: ;  NONFORMULARY OR COMPOUNDED ITEM, Take 1 tablet by mouth 2 (two) times daily. Orchard Blend, Disp: , Rfl: ;  NONFORMULARY OR COMPOUNDED ITEM, Take 1 tablet by mouth daily. [employer], Disp: , Rfl:  polyethylene glycol (MIRALAX / GLYCOLAX) packet, Take 17 g by mouth daily as needed., Disp: , Rfl: ;  spironolactone (ALDACTONE) 25 MG tablet, Take 1 tablet (25 mg total) by mouth daily., Disp: , Rfl: ;  warfarin (COUMADIN) 2.5 MG tablet, Take 3 mg  Saturday, Sunday, Tuesday and Thursday and take 2.5 on M, W, F, Disp: , Rfl:   BP 157/67  Pulse 73  Resp 16  Ht 5\' 8"  (1.727 m)  Wt 210 lb (95.255 kg)  BMI 31.94 kg/m2  Body mass index is 31.94 kg/(m^2).          Review of Systems no active chest pain, dyspnea on exertion, PND, orthopnea.     Objective:   Physical Exam BP 157/67  Pulse 73  Resp 16  Ht 5\' 8"  (1.727 m)  Wt 210 lb (95.255 kg)  BMI 31.94 kg/m2  Gen. elderly female in no apparent stress alert and oriented x3 Lungs no rhonchi or wheezing Both lower extremities have compression dressings in place. These are from mid calf proximally. No ulcerations are visualized.  Today I ordered bilateral venous duplex exam which are reviewed and interpreted. There is some mild reflux in the proximal right great saphenous vein and deep vein reflux on the right There is no significant reflux in the left leg great saphenous or small saphenous veins and there is deep vein reflux on the left There is no DVT bilaterally.     Assessment:     Improving ulcerations both lower extremities being treated with lymphedema compression pumps and topical medications with frequent dressing changes No evidence of significant superficial venous reflux to indicate need for laser ablation therapy    Plan:     Would continue current course of therapy and also elevate foot of bed 2-3 inches at night to further help with swelling According to granddaughter ulcerations have dramatically improved over the last few weeks and hopefully these will heal Return to see me on when necessary basis no further treatment indicated venous standpoint

## 2014-08-17 ENCOUNTER — Encounter (HOSPITAL_BASED_OUTPATIENT_CLINIC_OR_DEPARTMENT_OTHER): Payer: Medicare Other | Attending: Internal Medicine

## 2014-08-17 DIAGNOSIS — L03119 Cellulitis of unspecified part of limb: Secondary | ICD-10-CM

## 2014-08-17 DIAGNOSIS — L02619 Cutaneous abscess of unspecified foot: Secondary | ICD-10-CM | POA: Diagnosis not present

## 2014-08-17 DIAGNOSIS — L97909 Non-pressure chronic ulcer of unspecified part of unspecified lower leg with unspecified severity: Secondary | ICD-10-CM | POA: Insufficient documentation

## 2014-08-17 DIAGNOSIS — I890 Lymphedema, not elsewhere classified: Secondary | ICD-10-CM | POA: Insufficient documentation

## 2014-08-17 DIAGNOSIS — I872 Venous insufficiency (chronic) (peripheral): Secondary | ICD-10-CM | POA: Insufficient documentation

## 2014-08-23 ENCOUNTER — Non-Acute Institutional Stay: Payer: Medicare Other | Admitting: Nurse Practitioner

## 2014-08-23 ENCOUNTER — Encounter: Payer: Self-pay | Admitting: Nurse Practitioner

## 2014-08-23 VITALS — BP 160/72 | HR 68

## 2014-08-23 DIAGNOSIS — I4891 Unspecified atrial fibrillation: Secondary | ICD-10-CM

## 2014-08-23 DIAGNOSIS — L851 Acquired keratosis [keratoderma] palmaris et plantaris: Secondary | ICD-10-CM

## 2014-08-23 DIAGNOSIS — I482 Chronic atrial fibrillation, unspecified: Secondary | ICD-10-CM

## 2014-08-23 DIAGNOSIS — I10 Essential (primary) hypertension: Secondary | ICD-10-CM

## 2014-08-23 DIAGNOSIS — Z7901 Long term (current) use of anticoagulants: Secondary | ICD-10-CM

## 2014-08-23 DIAGNOSIS — L853 Xerosis cutis: Secondary | ICD-10-CM

## 2014-08-23 LAB — POCT INR: INR: 3 — AB (ref 0.9–1.1)

## 2014-08-23 NOTE — Progress Notes (Signed)
Patient ID: Brittany Schultz, female   DOB: 21-Jan-1922, 80 y.o.   MRN: [id number]   [facility] [phone])  Code Status: Full Code, Living Will, HCPOA      Contact Information   Name Relation Home Work Mobile   [place] Grandaughter [phone]     [name] Relative   [phone]      Chief Complaint  Patient presents with  . Medical Management of Chronic Issues    coag check     HPI: This is a 80 y.o. female resident of [facility], [facility]isted [facility] section evaluated today for anticoagulation management. Patient has continued taking warfarin as prescribed. No diet changes or new medications.  Reports on-going itch- improved with Eucerin. Only happens occasionally   Allergies  Allergen Reactions  . Sulfa Antibiotics Rash    Rash w/ sulfonomides furosemide, meloxicam  . Codeine   . Penicillins   . Sulfonylureas     MEDICATIONS    Medication List       This list is accurate as of: 08/23/14  2:32 PM.  Always use your most recent med list.               acetaminophen 325 MG tablet  Commonly known as:  TYLENOL  Take 650 mg by mouth 2 (two) times daily. May have additional 650mg  PO q 6hr as needed for pain     carvedilol 6.25 MG tablet  Commonly known as:  COREG  Take 6.25 mg by mouth 2 (two) times daily with a meal.     cholecalciferol 1000 UNITS tablet  Commonly known as:  VITAMIN D  Take 1,000 Units by mouth daily. Take 2,000 units daily     clobetasol ointment 0.05 %  Commonly known as:  TEMOVATE  Apply 1 application topically 2 (two) times daily. Apply oint to skin twice daily for itching     gabapentin 100 MG capsule  Commonly known as:  NEURONTIN  Take 100 mg by mouth at bedtime.     hydrOXYzine 25 MG tablet  Commonly known as:  ATARAX/VISTARIL  Take 1 tablet (25 mg total) by mouth every 8 (eight) hours as needed.     levothyroxine 125 MCG tablet  Commonly known as:  SYNTHROID, LEVOTHROID  Take 125  mcg by mouth daily before breakfast.     NONFORMULARY OR COMPOUNDED ITEM  Take 1 capsule by mouth 2 (two) times daily. Takes GARDEN BLEND; VINEYARD BLEND; ORCHARD BLEND; juice plus vitamins twice daily     NONFORMULARY OR COMPOUNDED ITEM  Take 1 tablet by mouth 2 (two) times daily. Orchard Blend     NONFORMULARY OR COMPOUNDED ITEM  Take 1 tablet by mouth daily. Vineyard Blend     polyethylene glycol packet  Commonly known as:  MIRALAX / GLYCOLAX  Take 17 g by mouth daily as needed.     spironolactone 25 MG tablet  Commonly known as:  ALDACTONE  Take 1 tablet (25 mg total) by mouth daily.     warfarin 2.5 MG tablet  Commonly known as:  COUMADIN  Take 3 mg Saturday, Sunday, Tuesday and Thursday and take 2.5 on M, W, F        Cardiovascular Exams  05/2014 2D Echocardiogram: LVEF 65-70%. Biatrial enlargement. Mild- moderate MV regurgitation, Mild TV regurgitation. Mild -moderate pulmonary hypertention  Laboratory Studies Lab Results  Component Value Date   WBC 8.9 02/16/2014   HGB 14.1 02/16/2014   HCT 42 02/16/2014   PLT 192 02/16/2014  Lab Results  Component Value Date   NA 139 05/11/2014   K 3.8 05/11/2014   GLU 79 05/11/2014   BUN 19 05/11/2014   CREATININE 0.8 05/11/2014   TSH  0.710      02/20/2014      Lab Results  Component Value Date   INR 3.0* 08/23/2014   INR 2.8* 07/26/2014   INR 2.8* 07/05/2014      REVIEW OF SYSTEMS  DATA OBTAINED: from patient, nurse , medical record,  GENERAL: Feels well   No recent fever, change in appetite or weight.   SKIN:occasional itching left upper arm overall improved.  EYES: No eye pain, dryness or itching  No change in vision EARS: No earache, tinnitus, HOH RESPIRATORY: No cough, wheezing, SOB. Gets short of breath when she walks, this comes and goes and is unchanged.  CARDIAC: No chest pain, palpitations   Edema noted to lower ext. NEUROLOGIC: No dizziness, fainting, headache,  No change in mental status.  PSYCHIATRIC: No  feelings of anxiety, depression  Sleeps well.  No behavior issue.    PHYSICAL EXAM Filed Vitals:   08/23/14 1401  BP: 160/72  Pulse: 68  SpO2: 96%   Body mass index is 0.00 kg/(m^2).  GENERAL APPEARANCE: No acute distress, appropriately groomed, Overweight body habitus. Alert, pleasant, conversant. EYES: Conjunctiva/lids clear.  EARS: External exam WNL, significantly diminished hearing   RESPIRATORY: Breathing is not labored, No audible upper airway wheeze. Lung sounds clear bilaterally.   CARDIOVASCULAR: Heart RRR. No murmur or extra heart sounds. 1+ Bil LE edema. NEUROLOGIC: Oriented to time, place, person. Speech clear, no tremor.  PSYCHIATRIC: Mood and affect appropriate to situation Skin: no rash or lesions noted, bilaterally LE , dry flaky skin  ASSESSMENT/PLAN  1. Chronic atrial fibrillation Heart in regular rhythm today, continue current medication including coumadin.  2. Long term (current) use of anticoagulants INR at 3.0 and has been trending up, will decrease dose, Warfarin 2.5 mg on Saturday, Sunday, Tuesday, and Thursday. 3 mg on M, W, F. Next INR in 2 weeks   3. Hypertension -BP slightly elevated at 160/72 - Continue Carvedilol 6.25 and Spironolactone.  - continue to monitor  4. Dry skin  - Improved with lotion

## 2014-08-24 DIAGNOSIS — I890 Lymphedema, not elsewhere classified: Secondary | ICD-10-CM | POA: Diagnosis not present

## 2014-08-24 DIAGNOSIS — L97909 Non-pressure chronic ulcer of unspecified part of unspecified lower leg with unspecified severity: Secondary | ICD-10-CM | POA: Diagnosis not present

## 2014-08-24 DIAGNOSIS — I872 Venous insufficiency (chronic) (peripheral): Secondary | ICD-10-CM | POA: Diagnosis not present

## 2014-08-24 DIAGNOSIS — L02619 Cutaneous abscess of unspecified foot: Secondary | ICD-10-CM | POA: Diagnosis not present

## 2014-08-30 DIAGNOSIS — L97909 Non-pressure chronic ulcer of unspecified part of unspecified lower leg with unspecified severity: Secondary | ICD-10-CM | POA: Diagnosis not present

## 2014-08-30 DIAGNOSIS — I890 Lymphedema, not elsewhere classified: Secondary | ICD-10-CM | POA: Diagnosis not present

## 2014-08-30 DIAGNOSIS — L02619 Cutaneous abscess of unspecified foot: Secondary | ICD-10-CM | POA: Diagnosis not present

## 2014-08-30 DIAGNOSIS — I872 Venous insufficiency (chronic) (peripheral): Secondary | ICD-10-CM | POA: Diagnosis not present

## 2014-09-06 ENCOUNTER — Non-Acute Institutional Stay: Payer: Medicare Other | Admitting: Nurse Practitioner

## 2014-09-06 ENCOUNTER — Encounter: Payer: Self-pay | Admitting: Nurse Practitioner

## 2014-09-06 ENCOUNTER — Encounter: Payer: Medicare Other | Admitting: Nurse Practitioner

## 2014-09-06 VITALS — BP 118/62 | HR 80 | Temp 98.1°F

## 2014-09-06 DIAGNOSIS — I890 Lymphedema, not elsewhere classified: Secondary | ICD-10-CM | POA: Diagnosis not present

## 2014-09-06 DIAGNOSIS — I482 Chronic atrial fibrillation, unspecified: Secondary | ICD-10-CM

## 2014-09-06 DIAGNOSIS — M158 Other polyosteoarthritis: Secondary | ICD-10-CM

## 2014-09-06 DIAGNOSIS — Z7901 Long term (current) use of anticoagulants: Secondary | ICD-10-CM

## 2014-09-06 DIAGNOSIS — I872 Venous insufficiency (chronic) (peripheral): Secondary | ICD-10-CM

## 2014-09-06 DIAGNOSIS — I4891 Unspecified atrial fibrillation: Secondary | ICD-10-CM

## 2014-09-06 DIAGNOSIS — L02619 Cutaneous abscess of unspecified foot: Secondary | ICD-10-CM | POA: Diagnosis not present

## 2014-09-06 DIAGNOSIS — L97909 Non-pressure chronic ulcer of unspecified part of unspecified lower leg with unspecified severity: Secondary | ICD-10-CM | POA: Diagnosis not present

## 2014-09-06 DIAGNOSIS — R0602 Shortness of breath: Secondary | ICD-10-CM

## 2014-09-06 DIAGNOSIS — M159 Polyosteoarthritis, unspecified: Secondary | ICD-10-CM

## 2014-09-06 NOTE — Progress Notes (Signed)
Patient ID: Brittany Schultz, female   DOB: [birth year], 80 y.o.   MRN: [id number]   [facility] [phone])  Code Status: Full Code, Living Will, HCPOA      Contact Information   Name Relation Home Work Mobile   [place] Grandaughter [phone]     [name] Relative   [phone]      Chief Complaint  Patient presents with  . Shortness of Breath    this morning, AL nurse had to put 02 on her for awhile. Not on it now  . Hip Pain    down to knee hurts. Unable to stand for weight    HPI: This is a 80 y.o. female resident of [facility], Assisted Living section evaluated today for acute visit due to worsening hip and knee pain. Xray was done and was negative. Grandson in law with pt at visit. Pt reports they went on 2 outings that took all day over the weekend. Last time pt did that she was worn out for a week per grandson in law and pt. Pt reports she thinks it was too much for her. Last night when trying to get her into the bed she was just worn out and was unable to lift her legs. With help of staff she was finally able to be placed in bed. However with all the help and all the movement pt became short of breath with palpitations. o2 sats were above 90% but they placed her on o2 for comfort. She reports she has been fine since then. Currently no shortness of breath or chest pains  Pt with hx of a fib and it is noted she was actually supposed to come in for INR check today but this appt was cancelled, no one knows why and acute visit scheduled. Late for appt with wound care and they do not want to miss this.    Allergies  Allergen Reactions  . Sulfa Antibiotics Rash    Rash w/ sulfonomides furosemide, meloxicam  . Codeine   . Penicillins   . Sulfonylureas     MEDICATIONS  Patient's Medications  New Prescriptions   No medications on file  Previous Medications   ACETAMINOPHEN (TYLENOL) 325 MG TABLET    Take 650 mg by mouth 2 (two)  times daily. May have additional 650mg  PO q 6hr as needed for pain   CARVEDILOL (COREG) 6.25 MG TABLET    Take 6.25 mg by mouth 2 (two) times daily with a meal.   CHOLECALCIFEROL (VITAMIN D) 1000 UNITS TABLET    Take 1,000 Units by mouth daily. Take 2,000 units daily   CLOBETASOL OINTMENT (TEMOVATE) 0.05 %    Apply 1 application topically 2 (two) times daily. Apply oint to skin twice daily for itching   GABAPENTIN (NEURONTIN) 100 MG CAPSULE    Take 100 mg by mouth at bedtime.   HYDROXYZINE (ATARAX/VISTARIL) 25 MG TABLET    Take 1 tablet (25 mg total) by mouth every 8 (eight) hours as needed.   HYDROXYZINE (VISTARIL) 25 MG CAPSULE    Take one every 8 hours as needed for itching   LEVOTHYROXINE (SYNTHROID, LEVOTHROID) 125 MCG TABLET    Take 125 mcg by mouth daily before breakfast.   NONFORMULARY OR COMPOUNDED ITEM    Take 1 capsule by mouth 2 (two) times daily. Takes GARDEN BLEND; VINEYARD BLEND; ORCHARD BLEND; juice plus vitamins twice daily   NONFORMULARY OR COMPOUNDED ITEM    Take 1 tablet by mouth 2 (two)  times daily. Orchard Blend   NONFORMULARY OR COMPOUNDED ITEM    Take 1 tablet by mouth daily. Vineyard Blend   POLYETHYLENE GLYCOL (MIRALAX / GLYCOLAX) PACKET    Take 17 g by mouth daily as needed.   SPIRONOLACTONE (ALDACTONE) 25 MG TABLET    Take 1 tablet (25 mg total) by mouth daily.  Modified Medications   Modified Medication Previous Medication   WARFARIN (COUMADIN) 2.5 MG TABLET warfarin (COUMADIN) 2.5 MG tablet      Take 3 mg M-W-F and take 2.5 on all other days    Take 3 mg Saturday, Sunday, Tuesday and Thursday and take 2.5 on M, W, F  Discontinued Medications   No medications on file     Cardiovascular Exams  05/2014 2D Echocardiogram: LVEF 65-70%. Biatrial enlargement. Mild- moderate MV regurgitation, Mild TV regurgitation. Mild -moderate pulmonary hypertention  Laboratory Studies Lab Results  Component Value Date   WBC 8.9 02/16/2014   HGB 14.1 02/16/2014   HCT 42 02/16/2014    PLT 192 02/16/2014   Lab Results  Component Value Date   NA 139 05/11/2014   K 3.8 05/11/2014   GLU 79 05/11/2014   BUN 19 05/11/2014   CREATININE 0.8 05/11/2014   TSH  0.710      02/20/2014      Lab Results  Component Value Date   INR 3.0* 08/23/2014   INR 2.8* 07/26/2014   INR 2.8* 07/05/2014      REVIEW OF SYSTEMS  DATA OBTAINED: from patient, nurse , medical record GENERAL: Feels ok No recent fever, change in appetite or weight.   SKIN: being seen at the wound care clinic due to LE ulcers  EYES: No eye pain, dryness or itching  No change in vision EARS: No earache, tinnitus, HOH RESPIRATORY: No cough, wheezing, or SOB at this time. Gets short of breath when she walks, this comes and goes and is unchanged. -see hpi CARDIAC: No chest pain, palpitations noted but have now resolved,  Edema noted to lower ext. Which has improved  NEUROLOGIC: No dizziness, fainting, headache,  No change in mental status.  MS: worsening pain to left hip and knee PSYCHIATRIC: No feelings of anxiety, depression  Sleeps well.  No behavior issue.    PHYSICAL EXAM Filed Vitals:   09/06/14 1345  BP: 118/62  Pulse: 80  Temp: 98.1 F (36.7 C)  TempSrc: Oral  SpO2: 93%   Body mass index is 0.00 kg/(m^2).  GENERAL APPEARANCE: No acute distress, appropriately groomed, Overweight body habitus. Alert, pleasant, conversant. EYES: Conjunctiva/lids clear.  EARS: External exam WNL, significantly diminished hearing   RESPIRATORY: Breathing is not labored, Lung sounds clear bilaterally with slight wheeze noted on end expiration.   CARDIOVASCULAR: Heart RRR. No murmur or extra heart sounds. 1+ Bil LE edema. MS: uses WC today, weakness to LE NEUROLOGIC: Oriented to time, place, person. Speech clear, no tremor.  PSYCHIATRIC: Mood and affect appropriate to situation Skin: no rash or lesions noted, bilaterally LE wrapped   ASSESSMENT/PLAN   1. Chronic atrial fibrillation Heart in regular rhythm today, continue  current medication including coumadin.  2. Long term (current) use of anticoagulants At last visit INR was 3.0 and dose was changed to warfarin 2.5 mg on Saturday, Sunday, Tuesday, and Thursday. 3 mg on M, W, F. -will have staff draw INR in am and call with results  3. Venous insufficiency of both lower extremities With ulcers, following up with wound clinic today (already late for appt)  therefore will have INR drawn in am   4. Other osteoarthritis involving multiple joints -worsening pain due to exertion, will increase tylenol to 1000 mg TID for 1 week then to resume previous dosing of 650 mg BID with PRN    5. Lymphedema Improved with pump, currently able to tolerate 2 hours a day   6. Shortness of breath Transient, not currently having symptoms,  cont to monitor

## 2014-09-07 LAB — POCT INR: INR: 2.9 — AB (ref <=1.1)

## 2014-09-13 DIAGNOSIS — I872 Venous insufficiency (chronic) (peripheral): Secondary | ICD-10-CM | POA: Diagnosis not present

## 2014-09-13 DIAGNOSIS — I890 Lymphedema, not elsewhere classified: Secondary | ICD-10-CM | POA: Diagnosis not present

## 2014-09-13 DIAGNOSIS — L97909 Non-pressure chronic ulcer of unspecified part of unspecified lower leg with unspecified severity: Secondary | ICD-10-CM | POA: Diagnosis not present

## 2014-09-13 DIAGNOSIS — L02619 Cutaneous abscess of unspecified foot: Secondary | ICD-10-CM | POA: Diagnosis not present

## 2014-09-20 ENCOUNTER — Encounter (HOSPITAL_BASED_OUTPATIENT_CLINIC_OR_DEPARTMENT_OTHER): Payer: Medicare Other | Attending: General Surgery

## 2014-09-20 DIAGNOSIS — L97929 Non-pressure chronic ulcer of unspecified part of left lower leg with unspecified severity: Secondary | ICD-10-CM | POA: Diagnosis not present

## 2014-09-20 DIAGNOSIS — I87333 Chronic venous hypertension (idiopathic) with ulcer and inflammation of bilateral lower extremity: Secondary | ICD-10-CM | POA: Diagnosis not present

## 2014-09-20 DIAGNOSIS — L97919 Non-pressure chronic ulcer of unspecified part of right lower leg with unspecified severity: Secondary | ICD-10-CM | POA: Diagnosis not present

## 2014-09-27 DIAGNOSIS — I87333 Chronic venous hypertension (idiopathic) with ulcer and inflammation of bilateral lower extremity: Secondary | ICD-10-CM | POA: Diagnosis not present

## 2014-09-27 DIAGNOSIS — L97929 Non-pressure chronic ulcer of unspecified part of left lower leg with unspecified severity: Secondary | ICD-10-CM | POA: Diagnosis not present

## 2014-09-27 DIAGNOSIS — L97919 Non-pressure chronic ulcer of unspecified part of right lower leg with unspecified severity: Secondary | ICD-10-CM | POA: Diagnosis not present

## 2014-10-04 ENCOUNTER — Encounter: Payer: Self-pay | Admitting: Nurse Practitioner

## 2014-10-04 ENCOUNTER — Non-Acute Institutional Stay: Payer: Medicare Other | Admitting: Nurse Practitioner

## 2014-10-04 VITALS — BP 110/64 | HR 75

## 2014-10-04 DIAGNOSIS — Z7901 Long term (current) use of anticoagulants: Secondary | ICD-10-CM

## 2014-10-04 DIAGNOSIS — I482 Chronic atrial fibrillation, unspecified: Secondary | ICD-10-CM

## 2014-10-04 LAB — POCT INR: INR: 4.8 — AB (ref <=1.1)

## 2014-10-04 NOTE — Progress Notes (Signed)
Patient ID: Brittany Schultz, female   DOB: [birth year], 80 y.o.   MRN: [id number]    Nursing Home Location:  [facility]   Place of Service: Clinic (12)  PCP: Kimber Relic, MD  Allergies  Allergen Reactions  . Sulfa Antibiotics Rash    Rash w/ sulfonomides furosemide, meloxicam  . Codeine   . Penicillins   . Sulfonylureas     Chief Complaint  Patient presents with  . Medical Management of Chronic Issues    Coag check     HPI:  This is a 80 y.o. female resident of [facility], Assisted Living section evaluated today for INR Check.  Last INR was 2.9 on 09/07/14, conts on 2.5 mg sat, sun, Tuesday, and Thursday and 3 mg on Monday, Wednesday, Friday. Taking medications as prescribed, No changes to other medications, no changes to diet. No bleeding noted. No antibiotic use   Pt conts with wound care, santyl 40 to wound, unna boots applied and using Pump twice daily as she is able to tolerate.   Review of Systems:  Review of Systems  Constitutional: Positive for activity change and fatigue. Negative for fever, chills, diaphoresis, appetite change and unexpected weight change.  HENT: Positive for hearing loss. Negative for congestion, mouth sores, postnasal drip and sore throat.   Eyes: Positive for visual disturbance (corrective lenses).  Respiratory: Positive for shortness of breath (chronic, no worsening of symtoms). Negative for cough, chest tightness, wheezing and stridor.   Cardiovascular: Positive for palpitations (at times with increased activities ) and leg swelling. Negative for chest pain.  Gastrointestinal: Negative for nausea, abdominal pain, diarrhea, constipation and abdominal distention.  Endocrine: Negative for cold intolerance, heat intolerance, polydipsia, polyphagia and polyuria.  Genitourinary: Positive for frequency. Negative for difficulty urinating.       Incontinence  Musculoskeletal: Positive for arthralgias, back  pain, gait problem (walking more with walker) and myalgias. Negative for neck pain and neck stiffness.  Skin: Positive for wound (chronic leg wounds).  Allergic/Immunologic: Negative.   Neurological: Positive for weakness (generalized). Negative for dizziness, tremors, seizures, syncope, speech difficulty, light-headedness, numbness and headaches.  Hematological: Negative.   Psychiatric/Behavioral: Negative.     Past Medical History  Diagnosis Date  . CHF (congestive heart failure)   . Unspecified hypothyroidism   . Hypertension   . Osteoarthritis of left hip   . A-fib     hospitalized 04/2009  . PVD (peripheral vascular disease)   . Venous stasis ulcer of left lower extremity   . Venous insufficiency of both lower extremities     vascular evaluation 10/2013, 01/2014 Dr.Powell  . Dermatitis 01/2014  . History of shingles   . Open wound of left lower leg 02/2104  . Long term (current) use of anticoagulants     warfarin for stroke risk reduction re: AF  . Vitamin D deficiency   . Hyperlipidemia   . Hx of colonic polyps     Adenomatous polyps  . Unstable gait   . Lack of coordination   . Other malaise and fatigue   . Muscle weakness (generalized)   . Peripheral vascular disease, unspecified   . Varicose veins of lower extremities with ulcer   . Thrombocytopenia April 2015    144,000  . Deaf   . Shortness of breath 03/23/14   Past Surgical History  Procedure Laterality Date  . Cataract extraction w/ intraocular lens  implant, bilateral Bilateral    Social History:   reports that she  has never smoked. She has never used smokeless tobacco. She reports that she does not drink alcohol or use illicit drugs.  Family History  Problem Relation Age of Onset  . Hypertension Mother   . Stroke Mother   . Heart disease Mother   . Hypertension Father   . Hypertension Brother     Medications: Patient's Medications  New Prescriptions   No medications on file  Previous Medications    ACETAMINOPHEN (TYLENOL) 325 MG TABLET    Take 650 mg by mouth 2 (two) times daily. May have additional 650mg  PO q 6hr as needed for pain   CARVEDILOL (COREG) 6.25 MG TABLET    Take 6.25 mg by mouth 2 (two) times daily with a meal.   CHOLECALCIFEROL (VITAMIN D) 1000 UNITS TABLET    Take 1,000 Units by mouth daily. Take 2,000 units daily   CLOBETASOL OINTMENT (TEMOVATE) 0.05 %    Apply 1 application topically 2 (two) times daily. Apply oint to skin twice daily for itching   GABAPENTIN (NEURONTIN) 100 MG CAPSULE    Take 100 mg by mouth at bedtime.   HYDROXYZINE (ATARAX/VISTARIL) 25 MG TABLET    Take 1 tablet (25 mg total) by mouth every 8 (eight) hours as needed.   HYDROXYZINE (VISTARIL) 25 MG CAPSULE    Take one every 8 hours as needed for itching   LEVOTHYROXINE (SYNTHROID, LEVOTHROID) 125 MCG TABLET    Take 125 mcg by mouth daily before breakfast.   NONFORMULARY OR COMPOUNDED ITEM    Take 1 capsule by mouth 2 (two) times daily. Takes GARDEN BLEND; VINEYARD BLEND; ORCHARD BLEND; juice plus vitamins twice daily   NONFORMULARY OR COMPOUNDED ITEM    Take 1 tablet by mouth 2 (two) times daily. Orchard Blend   NONFORMULARY OR COMPOUNDED ITEM    Take 1 tablet by mouth daily. Vineyard Blend   POLYETHYLENE GLYCOL (MIRALAX / GLYCOLAX) PACKET    Take 17 g by mouth daily as needed.   SPIRONOLACTONE (ALDACTONE) 25 MG TABLET    Take 1 tablet (25 mg total) by mouth daily.   WARFARIN (COUMADIN) 2.5 MG TABLET    Take 3 mg M-W-F and take 2.5 on all other days  Modified Medications   No medications on file  Discontinued Medications   No medications on file     Physical Exam: Filed Vitals:   10/04/14 1304  BP: 110/64  Pulse: 75  SpO2: 97%    Physical Exam  Constitutional: She appears well-developed and well-nourished. No distress.  Frail female in NAD  HENT:  Head: Normocephalic and atraumatic.  Mouth/Throat: Oropharynx is clear and moist. No oropharyngeal exudate.  Eyes: Conjunctivae are normal.  Pupils are equal, round, and reactive to light.  Neck: Normal range of motion. Neck supple.  Cardiovascular: Normal rate and normal heart sounds.  An irregular rhythm present.  Pulmonary/Chest: Effort normal and breath sounds normal.  Abdominal: Soft. Bowel sounds are normal.  Musculoskeletal: She exhibits edema (unna boots bilaterally). She exhibits no tenderness.  Neurological: She is alert. Coordination abnormal.  Skin: Skin is warm and dry. She is not diaphoretic.  Psychiatric: She has a normal mood and affect.    Labs reviewed: Basic Metabolic Panel:  Recent Labs  10/06/14 04/25/14 05/11/14  NA 142 141 139  K 3.9 3.7 3.8  BUN 17 82* 19  CREATININE 0.8 0.8 0.8   Liver Function Tests: No results found for this basename: AST, ALT, ALKPHOS, BILITOT, PROT, ALBUMIN,  in the last 8760  hours No results found for this basename: LIPASE, AMYLASE,  in the last 8760 hours No results found for this basename: AMMONIA,  in the last 8760 hours CBC:  Recent Labs  02/16/14  WBC 8.9  HGB 14.1  HCT 42  PLT 192   TSH: No results found for this basename: TSH,  in the last 8760 hours A1C: No results found for this basename: HGBA1C   Lipid Panel: No results found for this basename: CHOL, HDL, LDLCALC, TRIG, CHOLHDL, LDLDIRECT,  in the last 8760 hours  Lab Results  Component Value Date   INR 4.8* 10/04/2014   INR 2.9* 09/07/2014   INR 3.0* 08/23/2014    Cardiovascular Exams  05/2014 2D Echocardiogram: LVEF 65-70%. Biatrial enlargement. Mild- moderate MV regurgitation, Mild TV regurgitation. Mild -moderate pulmonary hypertention   Assessment/Plan 1. Chronic atrial fibrillation -Heart an irregular rhythm, rate well controlled, continue current medications including anticoagulation  2. Long term current use of anticoagulant therapy INR above therapeutic range, will hold coumadin today and tomorrow, follow up INR on 10/06/14 and to call for orders;  Future coagulation management will  take place in the [facility] clinic and she will follow up in 1 week

## 2014-10-06 DIAGNOSIS — L97919 Non-pressure chronic ulcer of unspecified part of right lower leg with unspecified severity: Secondary | ICD-10-CM | POA: Diagnosis not present

## 2014-10-06 DIAGNOSIS — I87333 Chronic venous hypertension (idiopathic) with ulcer and inflammation of bilateral lower extremity: Secondary | ICD-10-CM | POA: Diagnosis not present

## 2014-10-06 DIAGNOSIS — L97929 Non-pressure chronic ulcer of unspecified part of left lower leg with unspecified severity: Secondary | ICD-10-CM | POA: Diagnosis not present

## 2014-10-11 ENCOUNTER — Non-Acute Institutional Stay: Payer: Medicare Other | Admitting: Nurse Practitioner

## 2014-10-11 ENCOUNTER — Encounter: Payer: Self-pay | Admitting: Nurse Practitioner

## 2014-10-11 VITALS — BP 132/64 | HR 72

## 2014-10-11 DIAGNOSIS — L97919 Non-pressure chronic ulcer of unspecified part of right lower leg with unspecified severity: Secondary | ICD-10-CM | POA: Diagnosis not present

## 2014-10-11 DIAGNOSIS — Z7901 Long term (current) use of anticoagulants: Secondary | ICD-10-CM

## 2014-10-11 DIAGNOSIS — I482 Chronic atrial fibrillation, unspecified: Secondary | ICD-10-CM

## 2014-10-11 DIAGNOSIS — I87333 Chronic venous hypertension (idiopathic) with ulcer and inflammation of bilateral lower extremity: Secondary | ICD-10-CM | POA: Diagnosis not present

## 2014-10-11 DIAGNOSIS — M1612 Unilateral primary osteoarthritis, left hip: Secondary | ICD-10-CM

## 2014-10-11 DIAGNOSIS — L97929 Non-pressure chronic ulcer of unspecified part of left lower leg with unspecified severity: Secondary | ICD-10-CM | POA: Diagnosis not present

## 2014-10-11 MED ORDER — WARFARIN SODIUM 2.5 MG PO TABS: 2.5000 mg | ORAL_TABLET | Freq: Every day | ORAL | Status: DC

## 2014-10-11 NOTE — Progress Notes (Signed)
Patient ID: Brittany Schultz, female   DOB: [birth year], 79 y.o.   MRN: [id number]    Nursing Home Location:  [facility]   Place of Service: Clinic (12)  PCP: Kimber Relic, MD  Allergies  Allergen Reactions  . Sulfa Antibiotics Rash    Rash w/ sulfonomides furosemide, meloxicam  . Codeine   . Penicillins   . Sulfonylureas     Chief Complaint  Patient presents with  . Medical Management of Chronic Issues    coag check     HPI:  This is a 80 y.o. female resident of [facility], Assisted Living section evaluated today for INR Check.  Last INR was 4.8 on 10/04/14, coumadin was held for 2 days and on recheck INR was 2.9, coumadin was restarted at 2.5 mg daily   Taking medications as prescribed, No changes to other medications, no changes to diet. No bleeding noted. No antibiotic use   Pt conts with wound care, santyl 40 to wound, unna boots applied and using Pump twice daily as she is able to tolerate and has appt with wound care center once she leaves this appt.   Left hip with increased pain. Trying to get moving more due to leg wounds but her left hip is giving her a lot more pain. Went to Dr Nickola Major pain specialist earlier this week is trying pt on amitriptyline to help with pain not seeing any improvement yet also feels like she would be a good candidate for an intracuticular steroid injection, would need to be off coumadin for a few days before injection. Also need updated hip xray.   Review of Systems:  Review of Systems  Constitutional: Positive for activity change and fatigue. Negative for fever, chills, diaphoresis, appetite change and unexpected weight change.  HENT: Positive for hearing loss. Negative for congestion, mouth sores, postnasal drip and sore throat.   Eyes: Positive for visual disturbance (corrective lenses).  Respiratory: Positive for shortness of breath (chronic, no worsening of symtoms). Negative for cough,  chest tightness, wheezing and stridor.   Cardiovascular: Positive for palpitations (at times with increased activities ) and leg swelling. Negative for chest pain.  Gastrointestinal: Negative for nausea, abdominal pain, diarrhea, constipation and abdominal distention.  Endocrine: Negative for cold intolerance, heat intolerance, polydipsia, polyphagia and polyuria.  Genitourinary: Positive for frequency. Negative for difficulty urinating.       Incontinence  Musculoskeletal: Positive for arthralgias, back pain, gait problem (walking more with walker) and myalgias. Negative for neck pain and neck stiffness.  Skin: Positive for wound (chronic leg wounds).  Allergic/Immunologic: Negative.   Neurological: Positive for weakness (generalized). Negative for dizziness, tremors, seizures, syncope, speech difficulty, light-headedness, numbness and headaches.  Hematological: Negative.   Psychiatric/Behavioral: Negative.     Past Medical History  Diagnosis Date  . CHF (congestive heart failure)   . Unspecified hypothyroidism   . Hypertension   . Osteoarthritis of left hip   . A-fib     hospitalized 04/2009  . PVD (peripheral vascular disease)   . Venous stasis ulcer of left lower extremity   . Venous insufficiency of both lower extremities     vascular evaluation 10/2013, 01/2014 Dr.Powell  . Dermatitis 01/2014  . History of shingles   . Open wound of left lower leg 02/2104  . Long term (current) use of anticoagulants     warfarin for stroke risk reduction re: AF  . Vitamin D deficiency   . Hyperlipidemia   . Hx of  colonic polyps     Adenomatous polyps  . Unstable gait   . Lack of coordination   . Other malaise and fatigue   . Muscle weakness (generalized)   . Peripheral vascular disease, unspecified   . Varicose veins of lower extremities with ulcer   . Thrombocytopenia April 2015    144,000  . Deaf   . Shortness of breath 03/23/14   Past Surgical History  Procedure Laterality Date  .  Cataract extraction w/ intraocular lens  implant, bilateral Bilateral    Social History:   reports that she has never smoked. She has never used smokeless tobacco. She reports that she does not drink alcohol or use illicit drugs.  Family History  Problem Relation Age of Onset  . Hypertension Mother   . Stroke Mother   . Heart disease Mother   . Hypertension Father   . Hypertension Brother     Medications: Patient's Medications  New Prescriptions   No medications on file  Previous Medications   ACETAMINOPHEN (TYLENOL) 325 MG TABLET    Take 650 mg by mouth 2 (two) times daily. May have additional 650mg  PO q 6hr as needed for pain   AMITRIPTYLINE (ELAVIL) 10 MG TABLET    Take one at bedtime for 3 nights, take two at bedtime for 3 nights, then 3 tablets at bedtime   CARVEDILOL (COREG) 6.25 MG TABLET    Take 6.25 mg by mouth 2 (two) times daily with a meal.   CHOLECALCIFEROL (VITAMIN D) 1000 UNITS TABLET    Take 1,000 Units by mouth daily. Take 2,000 units daily   CLOBETASOL OINTMENT (TEMOVATE) 0.05 %    Apply 1 application topically 2 (two) times daily. Apply oint to skin twice daily for itching   GABAPENTIN (NEURONTIN) 100 MG CAPSULE    Take 100 mg by mouth at bedtime.   HYDROXYZINE (ATARAX/VISTARIL) 25 MG TABLET    Take 1 tablet (25 mg total) by mouth every 8 (eight) hours as needed.   LEVOTHYROXINE (SYNTHROID, LEVOTHROID) 125 MCG TABLET    Take 125 mcg by mouth daily before breakfast.   NONFORMULARY OR COMPOUNDED ITEM    Take 1 capsule by mouth 2 (two) times daily. Takes GARDEN BLEND; VINEYARD BLEND; ORCHARD BLEND; juice plus vitamins twice daily   NONFORMULARY OR COMPOUNDED ITEM    Take 1 tablet by mouth 2 (two) times daily. Orchard Blend   NONFORMULARY OR COMPOUNDED ITEM    Take 1 tablet by mouth daily. Vineyard Blend   POLYETHYLENE GLYCOL (MIRALAX / GLYCOLAX) PACKET    Take 17 g by mouth daily as needed.   SPIRONOLACTONE (ALDACTONE) 25 MG TABLET    Take 1 tablet (25 mg total) by  mouth daily.   WARFARIN (COUMADIN) 2.5 MG TABLET    Take 3 mg M-W-F and take 2.5 on all other days  Modified Medications   No medications on file  Discontinued Medications   HYDROXYZINE (VISTARIL) 25 MG CAPSULE    Take one every 8 hours as needed for itching     Physical Exam: Filed Vitals:   10/11/14 1417  BP: 132/64  Pulse: 72  SpO2: 99%    Physical Exam  Constitutional: She appears well-developed and well-nourished. No distress.  Frail female in NAD  HENT:  Head: Normocephalic and atraumatic.  Mouth/Throat: Oropharynx is clear and moist. No oropharyngeal exudate.  Eyes: Conjunctivae are normal. Pupils are equal, round, and reactive to light.  Neck: Normal range of motion. Neck supple.  Cardiovascular: Normal  rate and normal heart sounds.  An irregular rhythm present.  Pulmonary/Chest: Effort normal and breath sounds normal.  Abdominal: Soft. Bowel sounds are normal.  Musculoskeletal: She exhibits edema (unna boots bilaterally). She exhibits no tenderness.  Neurological: She is alert. Coordination abnormal.  Skin: Skin is warm and dry. She is not diaphoretic.  Psychiatric: She has a normal mood and affect.    Labs reviewed: Basic Metabolic Panel:  Recent Labs  54/65/68 04/25/14 05/11/14  NA 142 141 139  K 3.9 3.7 3.8  BUN 17 82* 19  CREATININE 0.8 0.8 0.8   Liver Function Tests: No results found for this basename: AST, ALT, ALKPHOS, BILITOT, PROT, ALBUMIN,  in the last 8760 hours No results found for this basename: LIPASE, AMYLASE,  in the last 8760 hours No results found for this basename: AMMONIA,  in the last 8760 hours CBC:  Recent Labs  02/16/14  WBC 8.9  HGB 14.1  HCT 42  PLT 192   TSH: No results found for this basename: TSH,  in the last 8760 hours A1C: No results found for this basename: HGBA1C   Lipid Panel: No results found for this basename: CHOL, HDL, LDLCALC, TRIG, CHOLHDL, LDLDIRECT,  in the last 8760 hours  Lab Results  Component  Value Date   INR 4.8* 10/04/2014   INR 2.9* 09/07/2014   INR 3.0* 08/23/2014    Cardiovascular Exams  05/2014 2D Echocardiogram: LVEF 65-70%. Biatrial enlargement. Mild- moderate MV regurgitation, Mild TV regurgitation. Mild -moderate pulmonary hypertention   Assessment/Plan 1. Chronic atrial fibrillation -Heart an irregular rhythm, rate well controlled, continue current medications including anticoagulation  2. Long term current use of anticoagulant therapy INR in therapeutic range, will cont 2.5 mg coumadin daily and follow up INR in 2 weeks  3. Osteoarthritis of left hip, unspecified osteoarthritis type -worsening pain with increase in ambulation. Seeing pain management who has offered injections however no recent xrays, will follow up xray of the left hip at this time.

## 2014-10-16 ENCOUNTER — Telehealth: Payer: Self-pay | Admitting: *Deleted

## 2014-10-16 NOTE — Telephone Encounter (Signed)
Brandy with sports Dr. Isidore Moos called and wanted to know the status on the clearance form to stop coumadin 6 days prior to procedure. Asked her to refax the order so I can have Jessica sign on Thursday when she is in office.

## 2014-10-17 NOTE — Telephone Encounter (Signed)
Received Clearance form from Dr. Barbette Merino at [facility] # [phone] Fax # [phone] to stop Coumadin 6 days before Lumbar injections. Gave to Dr. Chilton Si to sign off

## 2014-10-18 ENCOUNTER — Encounter (HOSPITAL_BASED_OUTPATIENT_CLINIC_OR_DEPARTMENT_OTHER): Payer: Medicare Other | Attending: General Surgery

## 2014-10-18 DIAGNOSIS — L97929 Non-pressure chronic ulcer of unspecified part of left lower leg with unspecified severity: Secondary | ICD-10-CM | POA: Insufficient documentation

## 2014-10-18 DIAGNOSIS — I87333 Chronic venous hypertension (idiopathic) with ulcer and inflammation of bilateral lower extremity: Secondary | ICD-10-CM | POA: Diagnosis present

## 2014-10-18 DIAGNOSIS — L97919 Non-pressure chronic ulcer of unspecified part of right lower leg with unspecified severity: Secondary | ICD-10-CM | POA: Diagnosis not present

## 2014-10-24 ENCOUNTER — Emergency Department (HOSPITAL_COMMUNITY)
Admission: EM | Admit: 2014-10-24 | Discharge: 2014-10-24 | Disposition: A | Discharge status: Home / Self Care | Payer: Medicare Other | Attending: Emergency Medicine | Admitting: Emergency Medicine

## 2014-10-24 ENCOUNTER — Emergency Department (HOSPITAL_COMMUNITY): Payer: Medicare Other

## 2014-10-24 ENCOUNTER — Encounter (HOSPITAL_COMMUNITY): Payer: Self-pay

## 2014-10-24 DIAGNOSIS — R4182 Altered mental status, unspecified: Secondary | ICD-10-CM

## 2014-10-24 DIAGNOSIS — L03116 Cellulitis of left lower limb: Secondary | ICD-10-CM | POA: Insufficient documentation

## 2014-10-24 DIAGNOSIS — E039 Hypothyroidism, unspecified: Secondary | ICD-10-CM | POA: Insufficient documentation

## 2014-10-24 DIAGNOSIS — L03115 Cellulitis of right lower limb: Secondary | ICD-10-CM | POA: Insufficient documentation

## 2014-10-24 DIAGNOSIS — Z7901 Long term (current) use of anticoagulants: Secondary | ICD-10-CM | POA: Diagnosis not present

## 2014-10-24 DIAGNOSIS — Z872 Personal history of diseases of the skin and subcutaneous tissue: Secondary | ICD-10-CM | POA: Diagnosis not present

## 2014-10-24 DIAGNOSIS — Z79899 Other long term (current) drug therapy: Secondary | ICD-10-CM | POA: Insufficient documentation

## 2014-10-24 DIAGNOSIS — H9190 Unspecified hearing loss, unspecified ear: Secondary | ICD-10-CM | POA: Diagnosis not present

## 2014-10-24 DIAGNOSIS — Z880 Allergy status to penicillin: Secondary | ICD-10-CM | POA: Insufficient documentation

## 2014-10-24 DIAGNOSIS — M1612 Unilateral primary osteoarthritis, left hip: Secondary | ICD-10-CM | POA: Diagnosis not present

## 2014-10-24 DIAGNOSIS — I878 Other specified disorders of veins: Secondary | ICD-10-CM | POA: Insufficient documentation

## 2014-10-24 DIAGNOSIS — I4891 Unspecified atrial fibrillation: Secondary | ICD-10-CM | POA: Diagnosis not present

## 2014-10-24 DIAGNOSIS — L97909 Non-pressure chronic ulcer of unspecified part of unspecified lower leg with unspecified severity: Secondary | ICD-10-CM | POA: Diagnosis present

## 2014-10-24 DIAGNOSIS — Z862 Personal history of diseases of the blood and blood-forming organs and certain disorders involving the immune mechanism: Secondary | ICD-10-CM | POA: Diagnosis not present

## 2014-10-24 DIAGNOSIS — I509 Heart failure, unspecified: Secondary | ICD-10-CM | POA: Diagnosis not present

## 2014-10-24 DIAGNOSIS — I10 Essential (primary) hypertension: Secondary | ICD-10-CM | POA: Diagnosis not present

## 2014-10-24 DIAGNOSIS — E559 Vitamin D deficiency, unspecified: Secondary | ICD-10-CM | POA: Diagnosis not present

## 2014-10-24 DIAGNOSIS — Z86010 Personal history of colonic polyps: Secondary | ICD-10-CM | POA: Diagnosis not present

## 2014-10-24 DIAGNOSIS — Z8619 Personal history of other infectious and parasitic diseases: Secondary | ICD-10-CM | POA: Diagnosis not present

## 2014-10-24 DIAGNOSIS — L03119 Cellulitis of unspecified part of limb: Secondary | ICD-10-CM

## 2014-10-24 LAB — URINALYSIS, ROUTINE W REFLEX MICROSCOPIC
Bilirubin Urine: NEGATIVE
Glucose, UA: NEGATIVE mg/dL
Hgb urine dipstick: NEGATIVE
KETONES UR: NEGATIVE mg/dL
NITRITE: NEGATIVE
Protein, ur: NEGATIVE mg/dL
SPECIFIC GRAVITY, URINE: 1.013 (ref 1.005–1.030)
UROBILINOGEN UA: 1 mg/dL (ref 0.0–1.0)
pH: 7 (ref 5.0–8.0)

## 2014-10-24 LAB — COMPREHENSIVE METABOLIC PANEL
ALBUMIN: 3 g/dL — AB (ref 3.5–5.2)
ALK PHOS: 93 U/L (ref 39–117)
ALT: 12 U/L (ref 0–35)
AST: 17 U/L (ref 0–37)
Anion gap: 12 (ref 5–15)
BUN: 15 mg/dL (ref 6–23)
CHLORIDE: 101 meq/L (ref 96–112)
CO2: 25 mEq/L (ref 19–32)
Calcium: 9 mg/dL (ref 8.4–10.5)
Creatinine, Ser: 0.81 mg/dL (ref 0.50–1.10)
GFR calc Af Amer: 72 mL/min — ABNORMAL LOW (ref >90)
GFR calc non Af Amer: 63 mL/min — ABNORMAL LOW (ref >90)
Glucose, Bld: 97 mg/dL (ref 70–99)
POTASSIUM: 4.4 meq/L (ref 3.7–5.3)
Sodium: 138 mEq/L (ref 137–147)
Total Bilirubin: 0.6 mg/dL (ref 0.3–1.2)
Total Protein: 7.8 g/dL (ref 6.0–8.3)

## 2014-10-24 LAB — CBC WITH DIFFERENTIAL/PLATELET
BASOS PCT: 1 % (ref 0–1)
Basophils Absolute: 0.1 10*3/uL (ref 0.0–0.1)
Eosinophils Absolute: 0.2 10*3/uL (ref 0.0–0.7)
Eosinophils Relative: 3 % (ref 0–5)
HCT: 35.5 % — ABNORMAL LOW (ref 36.0–46.0)
HEMOGLOBIN: 11.2 g/dL — AB (ref 12.0–15.0)
LYMPHS PCT: 12 % (ref 12–46)
Lymphs Abs: 1.1 10*3/uL (ref 0.7–4.0)
MCH: 26.2 pg (ref 26.0–34.0)
MCHC: 31.5 g/dL (ref 30.0–36.0)
MCV: 83.1 fL (ref 78.0–100.0)
MONOS PCT: 11 % (ref 3–12)
Monocytes Absolute: 1 10*3/uL (ref 0.1–1.0)
NEUTROS ABS: 6.5 10*3/uL (ref 1.7–7.7)
NEUTROS PCT: 73 % (ref 43–77)
Platelets: 166 10*3/uL (ref 150–400)
RBC: 4.27 MIL/uL (ref 3.87–5.11)
RDW: 18.7 % — ABNORMAL HIGH (ref 11.5–15.5)
WBC: 8.9 10*3/uL (ref 4.0–10.5)

## 2014-10-24 LAB — PROTIME-INR
INR: 1.29 (ref 0.00–1.49)
Prothrombin Time: 16.2 seconds — ABNORMAL HIGH (ref 11.6–15.2)

## 2014-10-24 LAB — URINE MICROSCOPIC-ADD ON

## 2014-10-24 LAB — PRO B NATRIURETIC PEPTIDE: PRO B NATRI PEPTIDE: 1840 pg/mL — AB (ref 0–450)

## 2014-10-24 MED ORDER — FUROSEMIDE 40 MG PO TABS
20.0000 mg | ORAL_TABLET | Freq: Once | ORAL | Status: AC
  Administered 2014-10-24: 20 mg via ORAL
  Filled 2014-10-24: qty 1

## 2014-10-24 MED ORDER — DOXYCYCLINE HYCLATE 100 MG PO CAPS: 100.0000 mg | ORAL_CAPSULE | Freq: Two times a day (BID) | ORAL | Status: DC

## 2014-10-24 MED ORDER — DOXYCYCLINE HYCLATE 100 MG PO TABS
100.0000 mg | ORAL_TABLET | Freq: Once | ORAL | Status: AC
  Administered 2014-10-24: 100 mg via ORAL
  Filled 2014-10-24: qty 1

## 2014-10-24 NOTE — Discharge Instructions (Signed)
Try to get the hip procedure done tomorrow or Thursday. Take the doxycyline as prescribed for cellulitis and follow up with the [facility] tomorrow. Return to ED for new or worsening symptoms.   Cellulitis Cellulitis is an infection of the skin and the tissue under the skin. The infected area is usually red and tender. This happens most often in the arms and lower legs. HOME CARE   Take your antibiotic medicine as told. Finish the medicine even if you start to feel better.  Keep the infected arm or leg raised (elevated).  Put a warm cloth on the area up to 4 times per day.  Only take medicines as told by your doctor.  Keep all doctor visits as told. GET HELP IF:  You see red streaks on the skin coming from the infected area.  Your red area gets bigger or turns a dark color.  Your bone or joint under the infected area is painful after the skin heals.  Your infection comes back in the same area or different area.  You have a puffy (swollen) bump in the infected area.  You have new symptoms.  You have a fever. GET HELP RIGHT AWAY IF:   You feel very sleepy.  You throw up (vomit) or have watery poop (diarrhea).  You feel sick and have muscle aches and pains. MAKE SURE YOU:   Understand these instructions.  Will watch your condition.  Will get help right away if you are not doing well or get worse. Document Released: 05/19/2008 Document Revised: 04/17/2014 Document Reviewed: 02/16/2012 [facility] Patient Information 2015 [place], [place]. This information is not intended to replace advice given to you by your health care provider. Make sure you discuss any questions you have with your health care provider.

## 2014-10-24 NOTE — ED Notes (Signed)
Phlebotomy and RN attempted to draw with no success. Main lab called for blood work.

## 2014-10-24 NOTE — ED Notes (Signed)
Bed: WA03 Expected date:  Expected time:  Means of arrival:  Comments: Ems- elderly edema

## 2014-10-24 NOTE — ED Notes (Signed)
Unsuccessfully attempted to obtain blood for labs x2.RN made aware 

## 2014-10-24 NOTE — ED Provider Notes (Signed)
CSN: [id number]     Arrival date & time 10/24/14  1229 History   First MD Initiated Contact with Patient 10/24/14 1506     Chief Complaint  Patient presents with  . leg wounds      (Consider location/radiation/quality/duration/timing/severity/associated sxs/prior Treatment) HPI Comments: Patient is an 80 year old female with history of CHF, hypothyroidism, hypertension, atrial fibrillation, PVD, venous stasis ulcer of bilateral lower extremities, hyperlipidemia, thrombocytopenia who presents to the ED for evaluation of altered mental status and worsening leg ulcerations. She is a resident at [facility] and they noticed she "wasn't acting like herself" this morning. She apparently had a hallucination of people coming to visit her. Her family reports she is generally acting normally at this time. They do note that it appears the redness has worsened on her legs bilaterally. She has been off her Coumadin for the past 7 days in preparation for a procedure on her hip which was scheduled for today. She has severe arthritis to her left hip and has not been ambulatory due to the pain. No fevers, chills, shortness of breath, chest pain.     The history is provided by the patient. No language interpreter was used.    Past Medical History  Diagnosis Date  . CHF (congestive heart failure)   . Unspecified hypothyroidism   . Hypertension   . Osteoarthritis of left hip   . A-fib     hospitalized 04/2009  . PVD (peripheral vascular disease)   . Venous stasis ulcer of left lower extremity   . Venous insufficiency of both lower extremities     vascular evaluation 10/2013, 01/2014 Dr.Powell  . Dermatitis 01/2014  . History of shingles   . Open wound of left lower leg 02/2104  . Long term (current) use of anticoagulants     warfarin for stroke risk reduction re: AF  . Vitamin D deficiency   . Hyperlipidemia   . Hx of colonic polyps     Adenomatous polyps  . Unstable gait   . Lack of  coordination   . Other malaise and fatigue   . Muscle weakness (generalized)   . Peripheral vascular disease, unspecified   . Varicose veins of lower extremities with ulcer   . Thrombocytopenia April 2015    144,000  . Deaf   . Shortness of breath 03/23/14   Past Surgical History  Procedure Laterality Date  . Cataract extraction w/ intraocular lens  implant, bilateral Bilateral    Family History  Problem Relation Age of Onset  . Hypertension Mother   . Stroke Mother   . Heart disease Mother   . Hypertension Father   . Hypertension Brother    History  Substance Use Topics  . Smoking status: Never Smoker   . Smokeless tobacco: Never Used  . Alcohol Use: No   OB History    No data available     Review of Systems  Constitutional: Negative for fever and chills.  Respiratory: Negative for shortness of breath.   Cardiovascular: Positive for leg swelling. Negative for chest pain.  Gastrointestinal: Negative for nausea, vomiting and abdominal pain.  Skin: Positive for color change and wound.  Neurological:       Confusion  All other systems reviewed and are negative.     Allergies  Sulfa antibiotics; Codeine; Penicillins; and Sulfonylureas  Home Medications   Prior to Admission medications   Medication Sig Start Date End Date Taking? Authorizing Provider  acetaminophen (TYLENOL) 325 MG tablet  Take 650 mg by mouth 2 (two) times daily. May have additional 650mg  PO q 6hr as needed for pain 03/17/14  Yes Claudette T 05/17/14, NP  amitriptyline (ELAVIL) 10 MG tablet Take 30 mg by mouth at bedtime.  10/09/14  Yes Historical Provider, MD  carvedilol (COREG) 6.25 MG tablet Take 6.25 mg by mouth 2 (two) times daily with a meal.   Yes Historical Provider, MD  cholecalciferol (VITAMIN D) 1000 UNITS tablet Take 1,000 Units by mouth daily. Take 2,000 units daily   Yes Historical Provider, MD  gabapentin (NEURONTIN) 100 MG capsule Take 100 mg by mouth at bedtime.   Yes Historical Provider,  MD  hydrOXYzine (ATARAX/VISTARIL) 25 MG tablet Take 1 tablet (25 mg total) by mouth every 8 (eight) hours as needed. Patient taking differently: Take 25 mg by mouth every 8 (eight) hours as needed for itching (while awake).  04/20/14  Yes Claudette T 06/20/14, NP  levothyroxine (SYNTHROID, LEVOTHROID) 125 MCG tablet Take 125 mcg by mouth daily before breakfast.   Yes Historical Provider, MD  NONFORMULARY OR COMPOUNDED ITEM Take by mouth 2 (two) times daily. Takes GARDEN BLEND; VINEYARD BLEND; ORCHARD BLEND; juice plus vitamins twice daily   Yes Historical Provider, MD  NONFORMULARY OR COMPOUNDED ITEM Take 1 tablet by mouth 2 (two) times daily. Orchard Blend 03/16/14  Yes Historical Provider, MD  NONFORMULARY OR COMPOUNDED ITEM Take 1 tablet by mouth daily. 05/16/14 Blend 03/16/14  Yes Historical Provider, MD  polyethylene glycol (MIRALAX / GLYCOLAX) packet Take 17 g by mouth daily as needed for mild constipation.    Yes Historical Provider, MD  spironolactone (ALDACTONE) 25 MG tablet Take 1 tablet (25 mg total) by mouth daily. 04/20/14  Yes Claudette 06/20/14, NP  warfarin (COUMADIN) 2.5 MG tablet Take 1 tablet (2.5 mg total) by mouth daily at 6 PM. 10/11/14  Yes 10/13/14, NP  clobetasol ointment (TEMOVATE) 0.05 % Apply 1 application topically 2 (two) times daily. Apply oint to skin twice daily for itching    Historical Provider, MD  diclofenac (FLECTOR) 1.3 % PTCH Place 1 patch onto the skin as needed (pain).    Historical Provider, MD   BP 155/65 mmHg  Pulse 76  Temp(Src) 97.5 F (36.4 C) (Oral)  Resp 18  Ht 5\' 8"  (1.727 m)  Wt 206 lb (93.441 kg)  BMI 31.33 kg/m2  SpO2 94% Physical Exam  Constitutional: She is oriented to person, place, and time. She appears well-developed and well-nourished. No distress.  HENT:  Head: Normocephalic and atraumatic.  Right Ear: External ear normal.  Left Ear: External ear normal.  Nose: Nose normal.  Mouth/Throat: Oropharynx is clear and moist.  Eyes:  Conjunctivae are normal.  Neck: Normal range of motion.  Cardiovascular: Normal rate, regular rhythm and normal heart sounds.   Bilateral edema and erythema to lower extremities Venous stasis ulcerations to legs bilaterally. No purulent drainage from wounds. No tenderness to palpation.   Pulmonary/Chest: Effort normal and breath sounds normal. No stridor. No respiratory distress. She has no wheezes. She has no rales.  Abdominal: Soft. She exhibits no distension.  Musculoskeletal: Normal range of motion.  Neurological: She is alert and oriented to person, place, and time. She has normal strength.  Skin: Skin is warm and dry. She is not diaphoretic. No erythema.  Psychiatric: She has a normal mood and affect. Her behavior is normal.  Nursing note and vitals reviewed.   ED Course  Procedures (including critical care time) Labs Review  Labs Reviewed  CBC WITH DIFFERENTIAL - Abnormal; Notable for the following:    Hemoglobin 11.2 (*)    HCT 35.5 (*)    RDW 18.7 (*)    All other components within normal limits  COMPREHENSIVE METABOLIC PANEL - Abnormal; Notable for the following:    Albumin 3.0 (*)    GFR calc non Af Amer 63 (*)    GFR calc Af Amer 72 (*)    All other components within normal limits  URINALYSIS, ROUTINE W REFLEX MICROSCOPIC - Abnormal; Notable for the following:    APPearance CLOUDY (*)    Leukocytes, UA TRACE (*)    All other components within normal limits  PROTIME-INR - Abnormal; Notable for the following:    Prothrombin Time 16.2 (*)    All other components within normal limits  PRO B NATRIURETIC PEPTIDE - Abnormal; Notable for the following:    Pro B Natriuretic peptide (BNP) 1840.0 (*)    All other components within normal limits  URINE MICROSCOPIC-ADD ON    Imaging Review Ct Head Wo Contrast  10/24/2014   CLINICAL DATA:  Altered mental status  EXAM: CT HEAD WITHOUT CONTRAST  TECHNIQUE: Contiguous axial images were obtained from the base of the skull  through the vertex without intravenous contrast.  COMPARISON:  None.  FINDINGS: There is no evidence of mass effect, midline shift, or extra-axial fluid collections. There is no evidence of a space-occupying lesion or intracranial hemorrhage. There is no evidence of a cortical-based area of acute infarction. There is generalized cerebral atrophy. There is periventricular white matter low attenuation likely secondary to microangiopathy.  The ventricles and sulci are appropriate for the patient's age. The basal cisterns are patent.  Visualized portions of the orbits are unremarkable. The visualized portions of the paranasal sinuses and mastoid air cells are unremarkable.  The osseous structures are unremarkable.  IMPRESSION: No acute intracranial pathology.   Electronically Signed   By: Elige Ko   On: 10/24/2014 17:09   Dg Chest Port 1 View  10/24/2014   CLINICAL DATA:  Increasing redness and drainage from bilateral leg wounds. History of atrial fibrillation. Altered mental status.  EXAM: PORTABLE CHEST - 1 VIEW  COMPARISON:  None.  FINDINGS: Cardiomegaly. Bilateral airspace opacities and interstitial prominence, right greater than left. Favor asymmetric edema. Cannot completely exclude pneumonia, particularly in the right lung base. Small right pleural effusion.  No acute bony abnormality.  Degenerative changes in the shoulders.  IMPRESSION: Bilateral interstitial and airspace opacities, right greater than left. This could represent asymmetric edema or infection. Small right pleural effusion.   Electronically Signed   By: Charlett Nose M.D.   On: 10/24/2014 16:14     EKG Interpretation None      MDM   Final diagnoses:  Altered mental status  Acute congestive heart failure, unspecified congestive heart failure type  Venous stasis  Cellulitis of lower extremity, unspecified laterality    Patient presents emergency department for evaluation of altered mental status and worsening erythema to  bilateral lower extremities. Patient with a brief episode of altered mental status, but is appears to have completely resolved. Labs are unremarkable. The patient does not have an elevated white blood cell count. She is afebrile and nontoxic appearing. Will give doxycycline for cellulitis as this has worked well for patient in the past. Patient has good support system and plans to get the procedure to her hip done either tomorrow or Thursday. Coumadin will be restarted immediately after procedure. Discussed  reasons to return to emergency department immediately with both family and patient. Vital signs stable for discharge. Dr. Madilyn Hook evaluated patient and agrees with plan. Patient / Family / Caregiver informed of clinical course, understand medical decision-making process, and agree with plan.     Mora Bellman, PA-C 10/26/14 0044  Tilden Fossa, MD 10/28/14 [phone]

## 2014-10-24 NOTE — ED Notes (Signed)
Per EMS- Patient is a resident of [facility]. Patient has been going to the Wound care center and today staff noted increased redness and drainage from bilateral leg wounds. Patient sent to the ED for further evaluation and treatment.

## 2014-10-25 ENCOUNTER — Encounter: Payer: Medicare Other | Admitting: Nurse Practitioner

## 2014-10-25 DIAGNOSIS — L97929 Non-pressure chronic ulcer of unspecified part of left lower leg with unspecified severity: Secondary | ICD-10-CM | POA: Diagnosis not present

## 2014-10-25 DIAGNOSIS — L97919 Non-pressure chronic ulcer of unspecified part of right lower leg with unspecified severity: Secondary | ICD-10-CM | POA: Diagnosis not present

## 2014-10-25 DIAGNOSIS — I87333 Chronic venous hypertension (idiopathic) with ulcer and inflammation of bilateral lower extremity: Secondary | ICD-10-CM | POA: Diagnosis not present

## 2014-10-30 ENCOUNTER — Inpatient Hospital Stay (HOSPITAL_COMMUNITY): Payer: Medicare Other

## 2014-10-30 ENCOUNTER — Non-Acute Institutional Stay: Payer: Medicare Other | Admitting: Internal Medicine

## 2014-10-30 ENCOUNTER — Inpatient Hospital Stay (HOSPITAL_COMMUNITY)
Admission: AD | Admit: 2014-10-30 | Discharge: 2014-11-02 | DRG: 602 | Disposition: A | Discharge status: Skilled Nursing Facility | Payer: Medicare Other | Source: Ambulatory Visit | Attending: Internal Medicine | Admitting: Internal Medicine

## 2014-10-30 ENCOUNTER — Encounter (HOSPITAL_COMMUNITY): Payer: Self-pay | Admitting: General Practice

## 2014-10-30 ENCOUNTER — Encounter: Payer: Self-pay | Admitting: Internal Medicine

## 2014-10-30 VITALS — BP 140/80 | HR 60 | Temp 95.1°F

## 2014-10-30 DIAGNOSIS — I83029 Varicose veins of left lower extremity with ulcer of unspecified site: Secondary | ICD-10-CM | POA: Diagnosis present

## 2014-10-30 DIAGNOSIS — I872 Venous insufficiency (chronic) (peripheral): Secondary | ICD-10-CM | POA: Diagnosis present

## 2014-10-30 DIAGNOSIS — Z66 Do not resuscitate: Secondary | ICD-10-CM | POA: Diagnosis present

## 2014-10-30 DIAGNOSIS — H9190 Unspecified hearing loss, unspecified ear: Secondary | ICD-10-CM | POA: Diagnosis present

## 2014-10-30 DIAGNOSIS — E8770 Fluid overload, unspecified: Secondary | ICD-10-CM | POA: Diagnosis present

## 2014-10-30 DIAGNOSIS — L97929 Non-pressure chronic ulcer of unspecified part of left lower leg with unspecified severity: Secondary | ICD-10-CM

## 2014-10-30 DIAGNOSIS — R4182 Altered mental status, unspecified: Secondary | ICD-10-CM

## 2014-10-30 DIAGNOSIS — E785 Hyperlipidemia, unspecified: Secondary | ICD-10-CM | POA: Diagnosis present

## 2014-10-30 DIAGNOSIS — L02416 Cutaneous abscess of left lower limb: Secondary | ICD-10-CM | POA: Diagnosis present

## 2014-10-30 DIAGNOSIS — L03116 Cellulitis of left lower limb: Principal | ICD-10-CM | POA: Diagnosis present

## 2014-10-30 DIAGNOSIS — I890 Lymphedema, not elsewhere classified: Secondary | ICD-10-CM | POA: Diagnosis present

## 2014-10-30 DIAGNOSIS — E039 Hypothyroidism, unspecified: Secondary | ICD-10-CM | POA: Diagnosis present

## 2014-10-30 DIAGNOSIS — I83229 Varicose veins of left lower extremity with both ulcer of unspecified site and inflammation: Secondary | ICD-10-CM | POA: Diagnosis present

## 2014-10-30 DIAGNOSIS — I5031 Acute diastolic (congestive) heart failure: Secondary | ICD-10-CM | POA: Diagnosis present

## 2014-10-30 DIAGNOSIS — R5381 Other malaise: Secondary | ICD-10-CM | POA: Diagnosis present

## 2014-10-30 DIAGNOSIS — L03115 Cellulitis of right lower limb: Secondary | ICD-10-CM | POA: Diagnosis present

## 2014-10-30 DIAGNOSIS — R410 Disorientation, unspecified: Secondary | ICD-10-CM | POA: Insufficient documentation

## 2014-10-30 DIAGNOSIS — R269 Unspecified abnormalities of gait and mobility: Secondary | ICD-10-CM | POA: Diagnosis present

## 2014-10-30 DIAGNOSIS — M1612 Unilateral primary osteoarthritis, left hip: Secondary | ICD-10-CM | POA: Diagnosis present

## 2014-10-30 DIAGNOSIS — Z7901 Long term (current) use of anticoagulants: Secondary | ICD-10-CM

## 2014-10-30 DIAGNOSIS — L309 Dermatitis, unspecified: Secondary | ICD-10-CM | POA: Diagnosis present

## 2014-10-30 DIAGNOSIS — I83219 Varicose veins of right lower extremity with both ulcer of unspecified site and inflammation: Secondary | ICD-10-CM | POA: Diagnosis present

## 2014-10-30 DIAGNOSIS — L0390 Cellulitis, unspecified: Secondary | ICD-10-CM | POA: Diagnosis present

## 2014-10-30 DIAGNOSIS — I739 Peripheral vascular disease, unspecified: Secondary | ICD-10-CM

## 2014-10-30 DIAGNOSIS — L97909 Non-pressure chronic ulcer of unspecified part of unspecified lower leg with unspecified severity: Secondary | ICD-10-CM

## 2014-10-30 DIAGNOSIS — R278 Other lack of coordination: Secondary | ICD-10-CM | POA: Diagnosis present

## 2014-10-30 DIAGNOSIS — Z86010 Personal history of colonic polyps: Secondary | ICD-10-CM | POA: Diagnosis not present

## 2014-10-30 DIAGNOSIS — G9340 Encephalopathy, unspecified: Secondary | ICD-10-CM

## 2014-10-30 DIAGNOSIS — L97829 Non-pressure chronic ulcer of other part of left lower leg with unspecified severity: Secondary | ICD-10-CM | POA: Diagnosis present

## 2014-10-30 DIAGNOSIS — I878 Other specified disorders of veins: Secondary | ICD-10-CM

## 2014-10-30 DIAGNOSIS — I10 Essential (primary) hypertension: Secondary | ICD-10-CM | POA: Diagnosis present

## 2014-10-30 DIAGNOSIS — I509 Heart failure, unspecified: Secondary | ICD-10-CM

## 2014-10-30 DIAGNOSIS — L03119 Cellulitis of unspecified part of limb: Secondary | ICD-10-CM

## 2014-10-30 DIAGNOSIS — L02415 Cutaneous abscess of right lower limb: Secondary | ICD-10-CM | POA: Diagnosis present

## 2014-10-30 DIAGNOSIS — F05 Delirium due to known physiological condition: Secondary | ICD-10-CM

## 2014-10-30 DIAGNOSIS — S81802D Unspecified open wound, left lower leg, subsequent encounter: Secondary | ICD-10-CM

## 2014-10-30 DIAGNOSIS — I4891 Unspecified atrial fibrillation: Secondary | ICD-10-CM | POA: Diagnosis present

## 2014-10-30 DIAGNOSIS — R531 Weakness: Secondary | ICD-10-CM

## 2014-10-30 DIAGNOSIS — L02419 Cutaneous abscess of limb, unspecified: Secondary | ICD-10-CM

## 2014-10-30 DIAGNOSIS — G92 Toxic encephalopathy: Secondary | ICD-10-CM | POA: Diagnosis present

## 2014-10-30 DIAGNOSIS — I83009 Varicose veins of unspecified lower extremity with ulcer of unspecified site: Secondary | ICD-10-CM

## 2014-10-30 LAB — CBC WITH DIFFERENTIAL/PLATELET
Basophils Absolute: 0 10*3/uL (ref 0.0–0.1)
Basophils Relative: 0 % (ref 0–1)
EOS ABS: 0.2 10*3/uL (ref 0.0–0.7)
Eosinophils Relative: 2 % (ref 0–5)
HCT: 38.6 % (ref 36.0–46.0)
Hemoglobin: 12 g/dL (ref 12.0–15.0)
Lymphocytes Relative: 12 % (ref 12–46)
Lymphs Abs: 1.3 10*3/uL (ref 0.7–4.0)
MCH: 26.4 pg (ref 26.0–34.0)
MCHC: 31.1 g/dL (ref 30.0–36.0)
MCV: 84.8 fL (ref 78.0–100.0)
MONO ABS: 0.9 10*3/uL (ref 0.1–1.0)
Monocytes Relative: 8 % (ref 3–12)
Neutro Abs: 8.5 10*3/uL — ABNORMAL HIGH (ref 1.7–7.7)
Neutrophils Relative %: 78 % — ABNORMAL HIGH (ref 43–77)
PLATELETS: 260 10*3/uL (ref 150–400)
RBC: 4.55 MIL/uL (ref 3.87–5.11)
RDW: 18.6 % — ABNORMAL HIGH (ref 11.5–15.5)
WBC: 10.9 10*3/uL — ABNORMAL HIGH (ref 4.0–10.5)

## 2014-10-30 LAB — MRSA PCR SCREENING: MRSA by PCR: NEGATIVE

## 2014-10-30 LAB — URINALYSIS, ROUTINE W REFLEX MICROSCOPIC
BILIRUBIN URINE: NEGATIVE
Glucose, UA: NEGATIVE mg/dL
HGB URINE DIPSTICK: NEGATIVE
KETONES UR: NEGATIVE mg/dL
Nitrite: NEGATIVE
PROTEIN: NEGATIVE mg/dL
Specific Gravity, Urine: 1.024 (ref 1.005–1.030)
UROBILINOGEN UA: 0.2 mg/dL (ref 0.0–1.0)
pH: 5 (ref 5.0–8.0)

## 2014-10-30 LAB — PROTIME-INR
INR: 1.41 (ref 0.00–1.49)
PROTHROMBIN TIME: 17.4 s — AB (ref 11.6–15.2)

## 2014-10-30 LAB — URINE MICROSCOPIC-ADD ON

## 2014-10-30 LAB — AMMONIA: AMMONIA: 21 umol/L (ref 11–60)

## 2014-10-30 LAB — PRO B NATRIURETIC PEPTIDE: PRO B NATRI PEPTIDE: 1482 pg/mL — AB (ref 0–450)

## 2014-10-30 MED ORDER — VANCOMYCIN HCL 10 G IV SOLR
1500.0000 mg | INTRAVENOUS | Status: DC
  Administered 2014-10-30 – 2014-11-01 (×3): 1500 mg via INTRAVENOUS
  Filled 2014-10-30 (×4): qty 1500

## 2014-10-30 MED ORDER — AMITRIPTYLINE HCL 10 MG PO TABS
30.0000 mg | ORAL_TABLET | Freq: Every day | ORAL | Status: DC
  Administered 2014-10-30 – 2014-11-01 (×3): 30 mg via ORAL
  Filled 2014-10-30 (×4): qty 3

## 2014-10-30 MED ORDER — LEVOTHYROXINE SODIUM 125 MCG PO TABS
125.0000 ug | ORAL_TABLET | Freq: Every day | ORAL | Status: DC
Start: 2014-10-31 — End: 2014-11-02
  Administered 2014-10-31 – 2014-11-02 (×3): 125 ug via ORAL
  Filled 2014-10-30 (×4): qty 1

## 2014-10-30 MED ORDER — ACETAMINOPHEN 325 MG PO TABS
650.0000 mg | ORAL_TABLET | Freq: Two times a day (BID) | ORAL | Status: DC
  Administered 2014-10-30 – 2014-11-02 (×6): 650 mg via ORAL
  Filled 2014-10-30 (×6): qty 2

## 2014-10-30 MED ORDER — SODIUM CHLORIDE 0.9 % IV SOLN
INTRAVENOUS | Status: DC
  Administered 2014-10-30: 21:00:00 via INTRAVENOUS

## 2014-10-30 MED ORDER — HYDROXYZINE HCL 25 MG PO TABS: 25.0000 mg | ORAL_TABLET | Freq: Three times a day (TID) | ORAL | Status: DC | PRN

## 2014-10-30 MED ORDER — CARVEDILOL 6.25 MG PO TABS
6.2500 mg | ORAL_TABLET | Freq: Two times a day (BID) | ORAL | Status: DC
  Administered 2014-10-30 – 2014-11-02 (×7): 6.25 mg via ORAL
  Filled 2014-10-30 (×8): qty 1

## 2014-10-30 MED ORDER — SODIUM CHLORIDE 0.9 % IJ SOLN
3.0000 mL | Freq: Two times a day (BID) | INTRAMUSCULAR | Status: DC
  Administered 2014-10-31 – 2014-11-02 (×3): 3 mL via INTRAVENOUS

## 2014-10-30 MED ORDER — SPIRONOLACTONE 25 MG PO TABS
25.0000 mg | ORAL_TABLET | Freq: Every day | ORAL | Status: DC
  Administered 2014-10-31 – 2014-11-02 (×3): 25 mg via ORAL
  Filled 2014-10-30 (×3): qty 1

## 2014-10-30 MED ORDER — GABAPENTIN 100 MG PO CAPS
100.0000 mg | ORAL_CAPSULE | Freq: Every day | ORAL | Status: DC
  Administered 2014-10-30 – 2014-11-01 (×3): 100 mg via ORAL
  Filled 2014-10-30 (×4): qty 1

## 2014-10-30 MED ORDER — WARFARIN SODIUM 3 MG PO TABS
3.0000 mg | ORAL_TABLET | Freq: Once | ORAL | Status: AC
  Administered 2014-10-30: 3 mg via ORAL
  Filled 2014-10-30: qty 1

## 2014-10-30 MED ORDER — WARFARIN - PHARMACIST DOSING INPATIENT
Freq: Every day | Status: DC
  Administered 2014-10-31 – 2014-11-02 (×3)

## 2014-10-30 MED ORDER — ACETAMINOPHEN 325 MG PO TABS
650.0000 mg | ORAL_TABLET | Freq: Four times a day (QID) | ORAL | Status: DC | PRN
  Administered 2014-10-31: 650 mg via ORAL
  Filled 2014-10-30: qty 2

## 2014-10-30 NOTE — Progress Notes (Signed)
  Dr. Mahala Menghini notified of arrival to unit.

## 2014-10-30 NOTE — Progress Notes (Signed)
Patient states that she needs something for pain- NP Craige Cotta paged.

## 2014-10-30 NOTE — Progress Notes (Signed)
ANTIBIOTIC CONSULT NOTE - INITIAL  Pharmacy Consult for Coumadin and Vancomycin Indication: a.fib and cellulitis  Allergies  Allergen Reactions  . Sulfa Antibiotics Rash    Rash w/ sulfonomides furosemide, meloxicam  . Codeine     unknown  . Penicillins     unknown  . Sulfonylureas     unknown    Patient Measurements: Height: 5\' 8"  (172.7 cm) Weight: 194 lb 14.2 oz (88.4 kg) IBW/kg (Calculated) : 63.9 Adjusted Body Weight:   Vital Signs: Temp: 97.9 F (36.6 C) (11/16 2125) Temp Source: Oral (11/16 2125) BP: 170/71 mmHg (11/16 2125) Pulse Rate: 80 (11/16 2125) Intake/Output from previous day:   Intake/Output from this shift:    Labs:  Recent Labs  10/30/14 2052  WBC 10.9*  HGB 12.0  PLT 260   Estimated Creatinine Clearance: 53.7 mL/min (by C-G formula based on Cr of 0.81). No results for input(s): VANCOTROUGH, VANCOPEAK, VANCORANDOM, GENTTROUGH, GENTPEAK, GENTRANDOM, TOBRATROUGH, TOBRAPEAK, TOBRARND, AMIKACINPEAK, AMIKACINTROU, AMIKACIN in the last 72 hours.   Microbiology: Recent Results (from the past 720 hour(s))  MRSA PCR Screening     Status: None   Collection Time: 10/30/14  7:46 PM  Result Value Ref Range Status   MRSA by PCR NEGATIVE NEGATIVE Final    Comment:        The GeneXpert MRSA Assay (FDA approved for NASAL specimens only), is one component of a comprehensive MRSA colonization surveillance program. It is not intended to diagnose MRSA infection nor to guide or monitor treatment for MRSA infections.     Medical History: Past Medical History  Diagnosis Date  . CHF (congestive heart failure)   . Unspecified hypothyroidism   . Hypertension   . Osteoarthritis of left hip   . A-fib     hospitalized 04/2009  . PVD (peripheral vascular disease)   . Venous stasis ulcer of left lower extremity   . Venous insufficiency of both lower extremities     vascular evaluation 10/2013, 01/2014 Dr.Powell  . Dermatitis 01/2014  . History of shingles    . Open wound of left lower leg 02/2104  . Long term (current) use of anticoagulants     warfarin for stroke risk reduction re: AF  . Vitamin D deficiency   . Hyperlipidemia   . Hx of colonic polyps     Adenomatous polyps  . Unstable gait   . Lack of coordination   . Other malaise and fatigue   . Muscle weakness (generalized)   . Peripheral vascular disease, unspecified   . Varicose veins of lower extremities with ulcer   . Thrombocytopenia April 2015    144,000  . Deaf   . Shortness of breath 03/23/14    Medications:  Scheduled:  . acetaminophen  650 mg Oral BID  . amitriptyline  30 mg Oral QHS  . carvedilol  6.25 mg Oral BID WC  . gabapentin  100 mg Oral QHS  . [START ON 10/31/2014] levothyroxine  125 mcg Oral QAC breakfast  . sodium chloride  3 mL Intravenous Q12H  . [START ON 10/31/2014] spironolactone  25 mg Oral Daily  . vancomycin  1,500 mg Intravenous Q24H  . warfarin  3 mg Oral Once  . [START ON 10/31/2014] Warfarin - Pharmacist Dosing Inpatient   Does not apply q1800   Assessment: 80 yr old female nursing home resident was brought to the ED with worsening leg wounds in spite of being on doxycycline several days earlier and confusion. She has open leg wounds  on both legs with green drainage requiring several dressing changes daily. The family requested she be hospitalized. Her coumadin had been held for several days and her INR was low at 1.41. Her usual coumadin dose is 2 mg daily.  Goal of Therapy:  Vancomycin trough level 10-15 mcg/ml  INR 2-3 Plan:  Vancomycin 1500 mg IV q24hrs Levels when appropriate Coumadin 3 mg x 1 tonight. Daily INR  Eugene Garnet 10/30/2014,9:52 PM

## 2014-10-30 NOTE — H&P (Addendum)
Hospitalist Admission History and Physical  Patient name: Brittany Schultz Medical record number: [id number] Date of birth: 1922-09-23 Age: 80 y.o. Gender: female  Primary Care Provider: Kimber Relic, MD  Chief Complaint: Cellulitis, encephalopathy   History of Present Illness:This is a 80 y.o. year old female with significant past medical history of peripheral vascular diseases, venous stasis ulcers, a fib on coumadin, lymphedema  presenting with cellulitis, encephalopathy. Pt is currently resident of [facility]. Majority of history from Co-HCPOA Brittany Schultz). Per report, pt had some confusion and worsening LE erythema last week. Was seen at [facility] ER.  At the time Head CT WNL. CXR concerning for pulm edema vs. Infection. UA mildly concerning for UA. Work up concerning for cellulitis. Was started on oral doxycycline. Per report. Pt has had persistent confusion as well as increased bilateral drainage of LE venous ulcers. Pt is followed by wound care and [facility]. State that this is worst drainage they've seen in pt. No fevers or chills. Per co-HCPOA  pt had L hip steroid injection day after abx were given. Increased LE weaping seemed to start around 24 hours after injection. Also reports pt's coumadin was held 5 days prior to hip injection.  + report of pt being start on amitryptyline for pain w/in past 2-3 weeks.  Currently, pt denies any CP. Has some mild wheezing which is chronic. No SOB. No abd pain, vomiting, diarrhea. Co HCPOA feels pt is still mildly off of her baseline.    Assessment and Plan: Brittany Schultz is a 80 y.o. year old female presenting with cellulitis, encephalopathy    Active Problems:   Cellulitis  1- Cellulitis  -IV vanc  -wound care consult  -LE dopplers r/o DVT   2- Encephalopathy  -multifactorial- suspect acute on chronic issue in setting of infection -check CXR, UA ( ? Infectious process from most recent studies) -urine culture  -check ammonia  -head  CT  -hold sedating neurotropic meds overnight-consider restarting as needed.   3- Afib/CHF  -coumadin per pharmacy  -tele bed  -? Diastolic dysfunction on 2d ECHO 04/2014  -mild LE swelling  -check pro BNP  -may need gentle diuresis  -cont spironolactone   4- HTN -BP stable  -cont home regimen   FEN/GI: heart healthy diet  Prophylaxis: coumadin per pharmacy  Disposition: pending further evaluation  Code Status:DNR    Patient Active Problem List   Diagnosis Date Noted  . Subacute confusional state 10/30/2014  . Cellulitis 10/30/2014  . Varicose veins of lower extremities with ulcer and inflammation 08/15/2014  . Lymphedema 04/20/2014  . Debility 03/30/2014  . Shortness of breath 03/23/2014  . Deaf   . Varicose veins of lower extremities with ulcer   . Peripheral vascular disease   . Muscle weakness (generalized)   . Other malaise and fatigue   . Lack of coordination   . Unstable gait   . Hyperlipidemia   . Venous insufficiency of both lower extremities   . Open wound of left lower leg   . Hypertension   . Osteoarthritis of left hip   . Long term current use of anticoagulant therapy   . Thrombocytopenia 03/15/2014  . Cellulitis and abscess of leg, except foot 02/25/2014  . Generalized weakness 02/21/2014  . Unspecified constipation 02/21/2014  . Dermatitis 02/21/2014  . Unspecified hypothyroidism 02/21/2014  . Essential hypertension, benign 02/19/2014  . Congestive heart failure 02/19/2014  . A-fib 02/19/2014  . Macular rash 02/19/2014  . Osteoarthritis 02/19/2014  .  Hypothyroidism 02/19/2014  . Venous stasis ulcer of left lower extremity 02/19/2014   Past Medical History: Past Medical History  Diagnosis Date  . CHF (congestive heart failure)   . Unspecified hypothyroidism   . Hypertension   . Osteoarthritis of left hip   . A-fib     hospitalized 04/2009  . PVD (peripheral vascular disease)   . Venous stasis ulcer of left lower extremity   . Venous  insufficiency of both lower extremities     vascular evaluation 10/2013, 01/2014 Dr.Powell  . Dermatitis 01/2014  . History of shingles   . Open wound of left lower leg 02/2104  . Long term (current) use of anticoagulants     warfarin for stroke risk reduction re: AF  . Vitamin D deficiency   . Hyperlipidemia   . Hx of colonic polyps     Adenomatous polyps  . Unstable gait   . Lack of coordination   . Other malaise and fatigue   . Muscle weakness (generalized)   . Peripheral vascular disease, unspecified   . Varicose veins of lower extremities with ulcer   . Thrombocytopenia April 2015    144,000  . Deaf   . Shortness of breath 03/23/14    Past Surgical History: Past Surgical History  Procedure Laterality Date  . Cataract extraction w/ intraocular lens  implant, bilateral Bilateral     Social History: History   Social History  . Marital Status: Widowed    Spouse Name: N/A    Number of Children: N/A  . Years of Education: N/A   Occupational History  . retired Research officer, political party    Social History Main Topics  . Smoking status: Never Smoker   . Smokeless tobacco: Never Used  . Alcohol Use: No  . Drug Use: No  . Sexual Activity: Not on file   Other Topics Concern  . Not on file   Social History Narrative   Patient is Widowed. 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   Recently moved from her own home in [place], currently residing in assisted living section at [facility](02/2014)   No Smoking history, No Alcohol history   Patient has Advanced planning documents: Living Will, HCPOA                Family History: Family History  Problem Relation Age of Onset  . Hypertension Mother   . Stroke Mother   . Heart disease Mother   . Hypertension Father   . Hypertension Brother     Allergies: Allergies  Allergen Reactions  . Sulfa  Antibiotics Rash    Rash w/ sulfonomides furosemide, meloxicam  . Codeine     unknown  . Penicillins     unknown  . Sulfonylureas     unknown    Current Facility-Administered Medications  Medication Dose Route Frequency Provider Last Rate Last Dose  . 0.9 %  sodium chloride infusion   Intravenous Continuous Doree Albee, MD      . sodium chloride 0.9 % injection 3 mL  3 mL Intravenous Q12H Doree Albee, MD       Review Of Systems: 12 point ROS negative except as noted above in HPI.  Physical Exam: Filed Vitals:   10/30/14 1840  BP: 171/73  Pulse: 74  Temp: 97.5 F (36.4 C)  Resp: 18    General: cooperative and mildly  confused  HEENT: PERRLA and extra ocular movement intact Heart: S1, S2 normal, no murmur, rub or gallop, regular rate and rhythm Lungs: clear to auscultation, no wheezes or rales and unlabored breathing Abdomen: abdomen is soft without significant tenderness, masses, organomegaly or guarding Extremities: 2+ peripheral pulses, + 1-2+ edema in LEs below and above unna boots  Skin:as above  Neurology: normal without focal findings  Labs and Imaging: Lab Results  Component Value Date/Time   NA 138 10/24/2014 01:35 PM   NA 139 05/11/2014   K 4.4 10/24/2014 01:35 PM   CL 101 10/24/2014 01:35 PM   CO2 25 10/24/2014 01:35 PM   BUN 15 10/24/2014 01:35 PM   BUN 19 05/11/2014   CREATININE 0.81 10/24/2014 01:35 PM   CREATININE 0.8 05/11/2014   GLUCOSE 97 10/24/2014 01:35 PM   Lab Results  Component Value Date   WBC 8.9 10/24/2014   HGB 11.2* 10/24/2014   HCT 35.5* 10/24/2014   MCV 83.1 10/24/2014   PLT 166 10/24/2014    No results found.         Doree Albee MD  Pager: [phone]

## 2014-10-30 NOTE — Progress Notes (Signed)
Patient ID: Brittany Schultz, female   DOB: 1922-06-11, 80 y.o.   MRN: [id number]    [place] Room Number: 176  Place of Service: Clinic (12)    Allergies  Allergen Reactions  . Sulfa Antibiotics Rash    Rash w/ sulfonomides furosemide, meloxicam  . Codeine     unknown  . Penicillins     unknown  . Sulfonylureas     unknown    Chief Complaint  Patient presents with  . Hospitalization Follow-up    went to ER 10/24/14 for altered mental staes and worsening leg ulceration. Was given doxycycline for cellulitis.     HPI:   80 year old frail female resident at [facility] is seen in clinic today for worsening leg wounds at continuing confusion. She was seen in the ER 10/24/14 for similar complaints. She was started on Doxycycline and returned to [facility]. She has not improved with the confusion and the leg wounds have gotten worse. They are painful and draining copious amounts of green material. Culture was not done at ER. Te AL unit does not think they can continue to care for these deteriorating wounds safely. The family is asking for hospitalization.  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 Requiring multiple bandage changes daily    Medications: Patient's Medications  New Prescriptions   No medications on file  Previous Medications   ACETAMINOPHEN (TYLENOL) 325 MG TABLET    Take 650 mg by mouth 2 (two) times daily. May have additional 684m PO q 6hr as needed for pain   AMITRIPTYLINE (ELAVIL) 10 MG TABLET    Take 30 mg by mouth at bedtime.    CARVEDILOL (COREG) 6.25  MG TABLET    Take 6.25 mg by mouth 2 (two) times daily with a meal.   CHOLECALCIFEROL (VITAMIN D) 1000 UNITS TABLET    Take 1,000 Units by mouth daily. Take 2,000 units daily   CLOBETASOL OINTMENT (TEMOVATE) 0.05 %    Apply 1 application topically 2 (two) times daily. Apply oint to skin twice daily for itching   DICLOFENAC (FLECTOR) 1.3 % PTCH    Place 1 patch onto the skin as needed (pain).   DOXYCYCLINE (VIBRAMYCIN) 100 MG CAPSULE    Take 1 capsule (100 mg total) by mouth 2 (two) times daily.   GABAPENTIN (NEURONTIN) 100 MG CAPSULE    Take 100 mg by mouth at bedtime.   HYDROXYZINE (ATARAX/VISTARIL) 25 MG TABLET    Take 1 tablet (25 mg total) by mouth every 8 (eight) hours as needed.   LEVOTHYROXINE (SYNTHROID, LEVOTHROID) 125 MCG TABLET    Take 125 mcg by mouth daily before breakfast.   NONFORMULARY OR COMPOUNDED ITEM    Take by mouth 2 (two) times daily. Takes GARDEN BLEND; VINEYARD BLEND; ORCHARD BLEND; juice plus vitamins twice daily   NONFORMULARY OR COMPOUNDED ITEM    Take 1 tablet by mouth 2 (two) times daily. Orchard Blend   NONFORMULARY OR COMPOUNDED ITEM    Take 1 tablet by mouth daily. Vineyard Blend   POLYETHYLENE GLYCOL (MIRALAX / GLYCOLAX) PACKET    Take 17 g by mouth daily as needed for mild constipation.  SPIRONOLACTONE (ALDACTONE) 25 MG TABLET    Take 1 tablet (25 mg total) by mouth daily.   WARFARIN (COUMADIN) 2.5 MG TABLET    Take 1 tablet (2.5 mg total) by mouth daily at 6 PM.  Modified Medications   No medications on file  Discontinued Medications   No medications on file     Review of Systems  Constitutional: Positive for activity change and fatigue. Negative for fever, chills, diaphoresis, appetite change and unexpected weight change.  HENT: Positive for hearing loss. Negative for congestion, mouth sores, postnasal drip and sore throat.   Eyes: Positive for visual disturbance (corrective lenses).  Respiratory: Positive for shortness of breath (chronic, no worsening  of symtoms). Negative for cough, chest tightness, wheezing and stridor.   Cardiovascular: Positive for palpitations (at times with increased activities ) and leg swelling. Negative for chest pain.  Gastrointestinal: Negative for nausea, abdominal pain, diarrhea, constipation and abdominal distention.  Endocrine: Negative for cold intolerance, heat intolerance, polydipsia, polyphagia and polyuria.  Genitourinary: Positive for frequency. Negative for difficulty urinating.       Incontinence  Musculoskeletal: Positive for myalgias, back pain, arthralgias and gait problem (walking more with walker). Negative for neck pain and neck stiffness.  Skin: Positive for wound (chronic leg wounds with acute worsening and painful erythematous changes).  Allergic/Immunologic: Negative.   Neurological: Positive for weakness (generalized). Negative for dizziness, tremors, seizures, syncope, speech difficulty, light-headedness, numbness and headaches.  Hematological: Negative.   Psychiatric/Behavioral: Negative.     Filed Vitals:   10/30/14 1516  BP: 140/80  Pulse: 60  Temp: 95.1 F (35.1 C)  TempSrc: Oral   Body mass index is 0.00 kg/(m^2).  Physical Exam  Constitutional: She appears well-developed and well-nourished. No distress.  Frail female in NAD  HENT:  Head: Normocephalic and atraumatic.  Mouth/Throat: Oropharynx is clear and moist. No oropharyngeal exudate.  Eyes: Conjunctivae are normal. Pupils are equal, round, and reactive to light.  Neck: Normal range of motion. Neck supple.  Cardiovascular: Normal rate and normal heart sounds.  An irregular rhythm present.  Pulmonary/Chest: Effort normal and breath sounds normal.  Abdominal: Soft. Bowel sounds are normal.  Musculoskeletal: She exhibits edema (macerated open wounds on both legs with green drainage) and tenderness.  Neurological: She is alert. Coordination abnormal.  Skin: Skin is warm and dry. She is not diaphoretic.  Open wounds on  both legs  Psychiatric: She has a normal mood and affect.     Labs reviewed: Admission on 10/24/2014, Discharged on 10/24/2014  Component Date Value Ref Range Status  . WBC 10/24/2014 8.9  4.0 - 10.5 K/uL Final  . RBC 10/24/2014 4.27  3.87 - 5.11 MIL/uL Final  . Hemoglobin 10/24/2014 11.2* 12.0 - 15.0 g/dL Final  . HCT 10/24/2014 35.5* 36.0 - 46.0 % Final  . MCV 10/24/2014 83.1  78.0 - 100.0 fL Final  . MCH 10/24/2014 26.2  26.0 - 34.0 pg Final  . MCHC 10/24/2014 31.5  30.0 - 36.0 g/dL Final  . RDW 10/24/2014 18.7* 11.5 - 15.5 % Final  . Platelets 10/24/2014 166  150 - 400 K/uL Final  . Neutrophils Relative % 10/24/2014 73  43 - 77 % Final  . Neutro Abs 10/24/2014 6.5  1.7 - 7.7 K/uL Final  . Lymphocytes Relative 10/24/2014 12  12 - 46 % Final  . Lymphs Abs 10/24/2014 1.1  0.7 - 4.0 K/uL Final  . Monocytes Relative 10/24/2014 11  3 - 12 % Final  . Monocytes Absolute  10/24/2014 1.0  0.1 - 1.0 K/uL Final  . Eosinophils Relative 10/24/2014 3  0 - 5 % Final  . Eosinophils Absolute 10/24/2014 0.2  0.0 - 0.7 K/uL Final  . Basophils Relative 10/24/2014 1  0 - 1 % Final  . Basophils Absolute 10/24/2014 0.1  0.0 - 0.1 K/uL Final  . Sodium 10/24/2014 138  137 - 147 mEq/L Final  . Potassium 10/24/2014 4.4  3.7 - 5.3 mEq/L Final  . Chloride 10/24/2014 101  96 - 112 mEq/L Final  . CO2 10/24/2014 25  19 - 32 mEq/L Final  . Glucose, Bld 10/24/2014 97  70 - 99 mg/dL Final  . BUN 10/24/2014 15  6 - 23 mg/dL Final  . Creatinine, Ser 10/24/2014 0.81  0.50 - 1.10 mg/dL Final  . Calcium 10/24/2014 9.0  8.4 - 10.5 mg/dL Final  . Total Protein 10/24/2014 7.8  6.0 - 8.3 g/dL Final  . Albumin 10/24/2014 3.0* 3.5 - 5.2 g/dL Final  . AST 10/24/2014 17  0 - 37 U/L Final  . ALT 10/24/2014 12  0 - 35 U/L Final  . Alkaline Phosphatase 10/24/2014 93  39 - 117 U/L Final  . Total Bilirubin 10/24/2014 0.6  0.3 - 1.2 mg/dL Final  . GFR calc non Af Amer 10/24/2014 63* >90 mL/min Final  . GFR calc Af Amer  10/24/2014 72* >90 mL/min Final   Comment: (NOTE) The eGFR has been calculated using the CKD EPI equation. This calculation has not been validated in all clinical situations. eGFR's persistently <90 mL/min signify possible Chronic Kidney Disease.   . Anion gap 10/24/2014 12  5 - 15 Final  . Color, Urine 10/24/2014 YELLOW  YELLOW Final  . APPearance 10/24/2014 CLOUDY* CLEAR Final  . Specific Gravity, Urine 10/24/2014 1.013  1.005 - 1.030 Final  . pH 10/24/2014 7.0  5.0 - 8.0 Final  . Glucose, UA 10/24/2014 NEGATIVE  NEGATIVE mg/dL Final  . Hgb urine dipstick 10/24/2014 NEGATIVE  NEGATIVE Final  . Bilirubin Urine 10/24/2014 NEGATIVE  NEGATIVE Final  . Ketones, ur 10/24/2014 NEGATIVE  NEGATIVE mg/dL Final  . Protein, ur 10/24/2014 NEGATIVE  NEGATIVE mg/dL Final  . Urobilinogen, UA 10/24/2014 1.0  0.0 - 1.0 mg/dL Final  . Nitrite 10/24/2014 NEGATIVE  NEGATIVE Final  . Leukocytes, UA 10/24/2014 TRACE* NEGATIVE Final  . Prothrombin Time 10/24/2014 16.2* 11.6 - 15.2 seconds Final  . INR 10/24/2014 1.29  0.00 - 1.49 Final  . Pro B Natriuretic peptide (BNP) 10/24/2014 1840.0* 0 - 450 pg/mL Final  . Squamous Epithelial / LPF 10/24/2014 RARE  RARE Final  . WBC, UA 10/24/2014 0-2  <3 WBC/hpf Final  . RBC / HPF 10/24/2014 0-2  <3 RBC/hpf Final  . Bacteria, UA 10/24/2014 RARE  RARE Final  Nursing Home on 10/04/2014  Component Date Value Ref Range Status  . INR 09/07/2014 2.9* .9 - 1.1 Final  . INR 10/04/2014 4.8* .9 - 1.1 Final  Nursing Home on 08/23/2014  Component Date Value Ref Range Status  . INR 08/23/2014 3.0* 0.9 - 1.1 Final   Ct Head Wo Contrast  10/24/2014   CLINICAL DATA:  Altered mental status  EXAM: CT HEAD WITHOUT CONTRAST  TECHNIQUE: Contiguous axial images were obtained from the base of the skull through the vertex without intravenous contrast.  COMPARISON:  None.  FINDINGS: There is no evidence of mass effect, midline shift, or extra-axial fluid collections. There is no  evidence of a space-occupying lesion or intracranial hemorrhage. There is no  evidence of a cortical-based area of acute infarction. There is generalized cerebral atrophy. There is periventricular white matter low attenuation likely secondary to microangiopathy.  The ventricles and sulci are appropriate for the patient's age. The basal cisterns are patent.  Visualized portions of the orbits are unremarkable. The visualized portions of the paranasal sinuses and mastoid air cells are unremarkable.  The osseous structures are unremarkable.  IMPRESSION: No acute intracranial pathology.   Electronically Signed   By: Kathreen Devoid   On: 10/24/2014 17:09   Dg Chest Port 1 View  10/24/2014   CLINICAL DATA:  Increasing redness and drainage from bilateral leg wounds. History of atrial fibrillation. Altered mental status.  EXAM: PORTABLE CHEST - 1 VIEW  COMPARISON:  None.  FINDINGS: Cardiomegaly. Bilateral airspace opacities and interstitial prominence, right greater than left. Favor asymmetric edema. Cannot completely exclude pneumonia, particularly in the right lung base. Small right pleural effusion.  No acute bony abnormality.  Degenerative changes in the shoulders.  IMPRESSION: Bilateral interstitial and airspace opacities, right greater than left. This could represent asymmetric edema or infection. Small right pleural effusion.   Electronically Signed   By: Rolm Baptise M.D.   On: 10/24/2014 16:14    Assessment/Plan  1. Cellulitis and abscess of leg, except foot No help from doxycycline I believe that a period on inpatient hospitalization is warranted.  2. Generalized weakness worse  3. Subacute confusional state Failure to clear in the last week  4. Essential hypertension, benign controlled  5. Open wound of left lower leg, subsequent encounter Needs inpatient care  6. Peripheral vascular disease Diminished DP and PT pulses  7. Varicose veins of lower extremities with ulcer, unspecified  laterality Contributed to worsening of wounds  8. Venous insufficiency of both lower extremities Contributes to worsening of wounds  9. Acute on chronic congestive heart failure, unspecified congestive heart failure type BNP> 1800 on 10/24/14. Rales in both lungs.

## 2014-10-30 NOTE — Progress Notes (Signed)
Orthopedic Tech Progress Note Patient Details:  Brittany Schultz 1922/10/16 [id number] Applied bilateral Unna boots.  Pulses, sensation, motion intact bilaterally before and after application.  Capillary refill less than 2 seconds bilaterally before and after application. Ortho Devices Type of Ortho Device: Radio broadcast assistant Ortho Device/Splint Location: bilateral Ortho Device/Splint Interventions: Application   Lesle Chris 10/30/2014, 9:02 PM

## 2014-10-30 NOTE — Progress Notes (Signed)
Pt just arrived from [facility], A/O x4, Denies nausea/pain at this time. Lower BE with compression warps/ swollen/red/weeping. Grandson in law at bedside

## 2014-10-30 NOTE — Plan of Care (Signed)
Problem: Consults Goal: General Medical Patient Education See Patient Education Module for specific education.  Outcome: Completed/Met Date Met:  10/30/14

## 2014-10-31 DIAGNOSIS — G9340 Encephalopathy, unspecified: Secondary | ICD-10-CM

## 2014-10-31 DIAGNOSIS — I509 Heart failure, unspecified: Secondary | ICD-10-CM

## 2014-10-31 DIAGNOSIS — R600 Localized edema: Secondary | ICD-10-CM

## 2014-10-31 DIAGNOSIS — I4891 Unspecified atrial fibrillation: Secondary | ICD-10-CM

## 2014-10-31 DIAGNOSIS — L02419 Cutaneous abscess of limb, unspecified: Secondary | ICD-10-CM

## 2014-10-31 LAB — COMPREHENSIVE METABOLIC PANEL
ALK PHOS: 81 U/L (ref 39–117)
ALT: 11 U/L (ref 0–35)
AST: 13 U/L (ref 0–37)
Albumin: 2.7 g/dL — ABNORMAL LOW (ref 3.5–5.2)
Anion gap: 13 (ref 5–15)
BILIRUBIN TOTAL: 0.7 mg/dL (ref 0.3–1.2)
BUN: 20 mg/dL (ref 6–23)
CHLORIDE: 99 meq/L (ref 96–112)
CO2: 23 mEq/L (ref 19–32)
CREATININE: 0.7 mg/dL (ref 0.50–1.10)
Calcium: 8.5 mg/dL (ref 8.4–10.5)
GFR calc Af Amer: 86 mL/min — ABNORMAL LOW (ref >90)
GFR, EST NON AFRICAN AMERICAN: 74 mL/min — AB (ref >90)
Glucose, Bld: 92 mg/dL (ref 70–99)
POTASSIUM: 3.9 meq/L (ref 3.7–5.3)
Sodium: 135 mEq/L — ABNORMAL LOW (ref 137–147)
Total Protein: 6.9 g/dL (ref 6.0–8.3)

## 2014-10-31 LAB — PROTEIN / CREATININE RATIO, URINE
Creatinine, Urine: 54.95 mg/dL
Protein Creatinine Ratio: 0.25 — ABNORMAL HIGH (ref 0.00–0.15)
Total Protein, Urine: 13.6 mg/dL

## 2014-10-31 LAB — PROTIME-INR
INR: 1.5 — ABNORMAL HIGH (ref 0.00–1.49)
Prothrombin Time: 18.3 seconds — ABNORMAL HIGH (ref 11.6–15.2)

## 2014-10-31 MED ORDER — FUROSEMIDE 10 MG/ML IJ SOLN
20.0000 mg | Freq: Two times a day (BID) | INTRAMUSCULAR | Status: DC
  Administered 2014-10-31 – 2014-11-02 (×5): 20 mg via INTRAVENOUS
  Filled 2014-10-31 (×6): qty 2

## 2014-10-31 MED ORDER — CIPROFLOXACIN IN D5W 400 MG/200ML IV SOLN
400.0000 mg | Freq: Two times a day (BID) | INTRAVENOUS | Status: DC
  Administered 2014-10-31: 400 mg via INTRAVENOUS
  Filled 2014-10-31 (×2): qty 200

## 2014-10-31 MED ORDER — WARFARIN SODIUM 4 MG PO TABS
4.0000 mg | ORAL_TABLET | Freq: Once | ORAL | Status: AC
  Administered 2014-10-31: 4 mg via ORAL
  Filled 2014-10-31: qty 1

## 2014-10-31 MED ORDER — FUROSEMIDE 10 MG/ML IJ SOLN: 20.0000 mg | Freq: Two times a day (BID) | INTRAMUSCULAR | Status: DC

## 2014-10-31 NOTE — Progress Notes (Signed)
NP Craige Cotta notified concerning patient's b/p this am. Will continue to monitor

## 2014-10-31 NOTE — Progress Notes (Signed)
Utilization review completed.  

## 2014-10-31 NOTE — Progress Notes (Signed)
*  PRELIMINARY RESULTS* Vascular Ultrasound Lower extremity venous duplex has been completed.  Preliminary findings: Technically limited due to patient intolerance of venous compressions. No obvious evidence of DVT in visualized veins. Calf veins could not be fully evaluated due to bandages.   Farrel Demark, RDMS, RVT  10/31/2014, 11:32 AM

## 2014-10-31 NOTE — Progress Notes (Signed)
ANTICOAGULATION CONSULT NOTE - Follow Up Consult  Pharmacy Consult for Coumadin Indication: atrial fibrillation  Allergies  Allergen Reactions  . Sulfa Antibiotics Rash    Rash w/ sulfonomides furosemide, meloxicam  . Codeine     unknown  . Penicillins     unknown  . Sulfonylureas     unknown   Labs:  Recent Labs  10/30/14 2052 10/31/14 0627  HGB 12.0  --   HCT 38.6  --   PLT 260  --   LABPROT 17.4* 18.3*  INR 1.41 1.50*  CREATININE  --  0.70    Estimated Creatinine Clearance: 55 mL/min (by C-G formula based on Cr of 0.7).  Assessment: 80 year old female admitted with cellulitis and leg wounds continues on home Coumadin for Afib.  INR continues to be low today at 1.5, no bleeding noted  Goal of Therapy:  INR 2-3 Monitor platelets by anticoagulation protocol: Yes   Plan:  Coumadin 4 mg po x 1 dose today  Follow up AM INR  Thank you. Okey Regal, PharmD [phone]  10/31/2014,11:41 AM

## 2014-10-31 NOTE — Plan of Care (Signed)
Problem: Phase I Progression Outcomes Goal: OOB as tolerated unless otherwise ordered Outcome: Progressing Goal: Initial discharge plan identified Outcome: Progressing Goal: Voiding-avoid urinary catheter unless indicated Outcome: Progressing  Problem: Phase II Progression Outcomes Goal: Vital signs remain stable Outcome: Progressing Goal: Obtain order to discontinue catheter if appropriate Outcome: Not Applicable Date Met:  11/94/17  Problem: Phase III Progression Outcomes Goal: Pain controlled on oral analgesia Outcome: Progressing Goal: Activity at appropriate level-compared to baseline (UP IN CHAIR FOR HEMODIALYSIS)  Outcome: Progressing Goal: Voiding independently Outcome: Progressing

## 2014-10-31 NOTE — Progress Notes (Addendum)
PROGRESS NOTE  Brittany Schultz:[id number] DOB: 09/10/1922 DOA: 10/30/2014 PCP: Kimber Relic, MD  Brief history 80 year old female with a history of peripheral vascular disease, lower extremity lymphedema, atrial fibrillation on Coumadin, and hypothyroidism presented with increasing lower extremity edema, drainage, and confusion. The patient was discharged from the emergency department on 10/24/2014 with similar complaints. CT of the brain that time was negative. The patient was discharged with doxycycline. Since that time, there is pain complaints of increasing lower extremity edema and drainage. In addition, the patient recently received a left hip steroid injection prior to this hospitalization. The patient was started on Cipro and vancomycin and was admitted for her presumptive cellulitis of the legs and acute encephalopathy.  Assessment/Plan: Cellulitis of the lower extremity -Certainly, the patient may have had a mild component of secondary infection, but I believe that most of her changes in her legs may be due to venous stasis dermatitis -Discontinue Cipro -Continue vancomycin for now -Patient will benefit with compression dressings (UNNA boot)--new dressings placed 10/30/14 Lymphedema -in part due to fluid overload--see below -check urine protein/creatinine ratio Acute Diastolic CHF -echo -05/05/14 echo--EF 65-70% -daily weights -pt has pulmonary does pulmonary vascular congestion on chest x-ray, JVD on exam, elevated BNP, and appears clinically fluid overloaded -start furosemide 20 mg IV twice a day -Continue Aldactone Acute encephalopathy -Multifactorial including the patient's CHF, anticholinergic medications, recent steroid menstruation, and infectious process Atrial fibrillation -Rate controlled -Continue carvedilol -Continue Coumadin Hypothyroidism -Continue Synthroid   Family Communication:   Tried to call POA, Mark, left message Disposition Plan:    Home when medically stable     Antibiotics:  Ciprofloxacin 10/30/2014>> 10/31/2014  Vancomycin 10/30/2014>>>    Procedures/Studies: Dg Chest 2 View  10/30/2014   CLINICAL DATA:  Encephalopathy.  Shortness of breath.  EXAM: CHEST  2 VIEW  COMPARISON:  10/24/2014  FINDINGS: Mild cardiac enlargement and pulmonary vascular congestion. At the with blunting of the costophrenic angles suggesting small effusions. Hazy infiltration in the lung bases likely due to early edema. No pneumothorax. Calcified and tortuous aorta. Degenerative changes in the spine and shoulders.  IMPRESSION: Cardiac enlargement with mild pulmonary vascular congestion, small effusions, and early edema pattern.   Electronically Signed   By: Burman Nieves M.D.   On: 10/30/2014 22:15   Ct Head Wo Contrast  10/30/2014   CLINICAL DATA:  Confusion  EXAM: CT HEAD WITHOUT CONTRAST  TECHNIQUE: Contiguous axial images were obtained from the base of the skull through the vertex without intravenous contrast.  COMPARISON:  10/24/2014  FINDINGS: Bony calvarium is intact. There are in this lucency identified within multiple venous structures in the right orbit as well as the musculature surrounding the mandible and right face. This likely is related to small of the air emboli related to IV insertion. This was not present on the prior exam. Mild atrophic changes are seen. Mild chronic white matter ischemic change is noted. No findings to suggest acute hemorrhage, acute infarction or space-occupying mass lesion are noted.  IMPRESSION: Small amounts of venous air in the soft tissues of the face and orbit suggestive of air emboli during IV placement.  No acute intracranial abnormality is noted.  Chronic atrophic changes.   Electronically Signed   By: Alcide Clever M.D.   On: 10/30/2014 22:30   Ct Head Wo Contrast  10/24/2014   CLINICAL DATA:  Altered mental status  EXAM: CT HEAD WITHOUT CONTRAST  TECHNIQUE: Contiguous  axial images were obtained  from the base of the skull through the vertex without intravenous contrast.  COMPARISON:  None.  FINDINGS: There is no evidence of mass effect, midline shift, or extra-axial fluid collections. There is no evidence of a space-occupying lesion or intracranial hemorrhage. There is no evidence of a cortical-based area of acute infarction. There is generalized cerebral atrophy. There is periventricular white matter low attenuation likely secondary to microangiopathy.  The ventricles and sulci are appropriate for the patient's age. The basal cisterns are patent.  Visualized portions of the orbits are unremarkable. The visualized portions of the paranasal sinuses and mastoid air cells are unremarkable.  The osseous structures are unremarkable.  IMPRESSION: No acute intracranial pathology.   Electronically Signed   By: Elige Ko   On: 10/24/2014 17:09   Dg Chest Port 1 View  10/24/2014   CLINICAL DATA:  Increasing redness and drainage from bilateral leg wounds. History of atrial fibrillation. Altered mental status.  EXAM: PORTABLE CHEST - 1 VIEW  COMPARISON:  None.  FINDINGS: Cardiomegaly. Bilateral airspace opacities and interstitial prominence, right greater than left. Favor asymmetric edema. Cannot completely exclude pneumonia, particularly in the right lung base. Small right pleural effusion.  No acute bony abnormality.  Degenerative changes in the shoulders.  IMPRESSION: Bilateral interstitial and airspace opacities, right greater than left. This could represent asymmetric edema or infection. Small right pleural effusion.   Electronically Signed   By: Charlett Nose M.D.   On: 10/24/2014 16:14        Subjective: Patient is pleasant but confused. She denies any fevers, chills, chest pain, shortness breath, nausea, vomiting, diarrhea, abdominal pain. She complains of leg pain with leg edema.  Objective: Filed Vitals:   10/31/14 0218 10/31/14 0602 10/31/14 0602 10/31/14 1312  BP:   124/56 137/83  Pulse:       Temp:   97.7 F (36.5 C) 97.4 F (36.3 C)  TempSrc:   Oral Oral  Resp:   14 15  Height:      Weight: 88.4 kg (194 lb 14.2 oz) 90.3 kg (199 lb 1.2 oz)    SpO2:   95% 97%    Intake/Output Summary (Last 24 hours) at 10/31/14 1320 Last data filed at 10/31/14 1034  Gross per 24 hour  Intake    200 ml  Output    200 ml  Net      0 ml   Weight change:  Exam:   General:  Pt is alert, follows commands appropriately, not in acute distress  HEENT: No icterus, No thrush, No neck mass, [place]/AT  Cardiovascular: RRR, S1/S2, no rubs, no gallops  Respiratory: CTA bilaterally, no wheezing, no crackles, no rhonchi  Abdomen: Soft/+BS, non tender, non distended, no guarding  Extremities: 2+LE edema No lymphangitis, No petechiae, No rashes, no synovitis  Data Reviewed: Basic Metabolic Panel:  Recent Labs Lab 10/24/14 1335 10/31/14 0627  NA 138 135*  K 4.4 3.9  CL 101 99  CO2 25 23  GLUCOSE 97 92  BUN 15 20  CREATININE 0.81 0.70  CALCIUM 9.0 8.5   Liver Function Tests:  Recent Labs Lab 10/24/14 1335 10/31/14 0627  AST 17 13  ALT 12 11  ALKPHOS 93 81  BILITOT 0.6 0.7  PROT 7.8 6.9  ALBUMIN 3.0* 2.7*   No results for input(s): LIPASE, AMYLASE in the last 168 hours.  Recent Labs Lab 10/30/14 2052  AMMONIA 21   CBC:  Recent Labs Lab 10/24/14 1335 10/30/14  2052  WBC 8.9 10.9*  NEUTROABS 6.5 8.5*  HGB 11.2* 12.0  HCT 35.5* 38.6  MCV 83.1 84.8  PLT 166 260   Cardiac Enzymes: No results for input(s): CKTOTAL, CKMB, CKMBINDEX, TROPONINI in the last 168 hours. BNP: Invalid input(s): POCBNP CBG: No results for input(s): GLUCAP in the last 168 hours.  Recent Results (from the past 240 hour(s))  MRSA PCR Screening     Status: None   Collection Time: 10/30/14  7:46 PM  Result Value Ref Range Status   MRSA by PCR NEGATIVE NEGATIVE Final    Comment:        The GeneXpert MRSA Assay (FDA approved for NASAL specimens only), is one component of  a comprehensive MRSA colonization surveillance program. It is not intended to diagnose MRSA infection nor to guide or monitor treatment for MRSA infections.      Scheduled Meds: . acetaminophen  650 mg Oral BID  . amitriptyline  30 mg Oral QHS  . carvedilol  6.25 mg Oral BID WC  . furosemide  20 mg Intravenous BID  . gabapentin  100 mg Oral QHS  . levothyroxine  125 mcg Oral QAC breakfast  . sodium chloride  3 mL Intravenous Q12H  . spironolactone  25 mg Oral Daily  . vancomycin  1,500 mg Intravenous Q24H  . warfarin  4 mg Oral ONCE-1800  . Warfarin - Pharmacist Dosing Inpatient   Does not apply q1800   Continuous Infusions:    Teena Mangus, DO  [facility] Pager [phone]  If 7PM-7AM, please contact night-coverage [email or web address] Password [email or web address] 10/31/2014, 1:20 PM   LOS: 1 day

## 2014-10-31 NOTE — Plan of Care (Signed)
Problem: Phase I Progression Outcomes Goal: Pain controlled with appropriate interventions Outcome: Completed/Met Date Met:  10/31/14

## 2014-11-01 ENCOUNTER — Inpatient Hospital Stay (HOSPITAL_COMMUNITY): Payer: Medicare Other

## 2014-11-01 ENCOUNTER — Encounter (HOSPITAL_COMMUNITY): Payer: Self-pay | Admitting: Radiology

## 2014-11-01 DIAGNOSIS — I87312 Chronic venous hypertension (idiopathic) with ulcer of left lower extremity: Secondary | ICD-10-CM

## 2014-11-01 DIAGNOSIS — I369 Nonrheumatic tricuspid valve disorder, unspecified: Secondary | ICD-10-CM

## 2014-11-01 DIAGNOSIS — L03818 Cellulitis of other sites: Secondary | ICD-10-CM

## 2014-11-01 DIAGNOSIS — I5031 Acute diastolic (congestive) heart failure: Secondary | ICD-10-CM

## 2014-11-01 LAB — COMPREHENSIVE METABOLIC PANEL
ALK PHOS: 85 U/L (ref 39–117)
ALT: 12 U/L (ref 0–35)
ANION GAP: 12 (ref 5–15)
AST: 18 U/L (ref 0–37)
Albumin: 2.8 g/dL — ABNORMAL LOW (ref 3.5–5.2)
BUN: 18 mg/dL (ref 6–23)
CHLORIDE: 100 meq/L (ref 96–112)
CO2: 25 mEq/L (ref 19–32)
Calcium: 8.8 mg/dL (ref 8.4–10.5)
Creatinine, Ser: 0.74 mg/dL (ref 0.50–1.10)
GFR calc non Af Amer: 73 mL/min — ABNORMAL LOW (ref >90)
GFR, EST AFRICAN AMERICAN: 84 mL/min — AB (ref >90)
GLUCOSE: 83 mg/dL (ref 70–99)
POTASSIUM: 4.1 meq/L (ref 3.7–5.3)
Sodium: 137 mEq/L (ref 137–147)
Total Bilirubin: 0.6 mg/dL (ref 0.3–1.2)
Total Protein: 7.5 g/dL (ref 6.0–8.3)

## 2014-11-01 LAB — PROTIME-INR
INR: 1.92 — ABNORMAL HIGH (ref 0.00–1.49)
Prothrombin Time: 22.1 seconds — ABNORMAL HIGH (ref 11.6–15.2)

## 2014-11-01 MED ORDER — HYDRALAZINE HCL 25 MG PO TABS
25.0000 mg | ORAL_TABLET | Freq: Three times a day (TID) | ORAL | Status: DC
  Administered 2014-11-01 – 2014-11-02 (×4): 25 mg via ORAL
  Filled 2014-11-01 (×6): qty 1

## 2014-11-01 MED ORDER — WARFARIN SODIUM 2.5 MG PO TABS
2.5000 mg | ORAL_TABLET | Freq: Every day | ORAL | Status: DC
  Administered 2014-11-01 – 2014-11-02 (×2): 2.5 mg via ORAL
  Filled 2014-11-01 (×2): qty 1

## 2014-11-01 NOTE — Plan of Care (Signed)
Problem: Phase I Progression Outcomes Goal: OOB as tolerated unless otherwise ordered Outcome: Progressing Goal: Hemodynamically stable Outcome: Progressing  Problem: Phase II Progression Outcomes Goal: Progress activity as tolerated unless otherwise ordered Outcome: Progressing Goal: Discharge plan established Outcome: Progressing Goal: Vital signs remain stable Outcome: Progressing  Problem: Phase III Progression Outcomes Goal: Pain controlled on oral analgesia Outcome: Progressing Goal: Foley discontinued Outcome: Not Applicable Date Met:  03/88/82  Problem: Discharge Progression Outcomes Goal: Pain controlled with appropriate interventions Outcome: Progressing Goal: Hemodynamically stable Outcome: Progressing Goal: Tolerating diet Outcome: Progressing

## 2014-11-01 NOTE — Plan of Care (Signed)
Problem: Phase I Progression Outcomes Goal: OOB as tolerated unless otherwise ordered Outcome: Completed/Met Date Met:  11/01/14

## 2014-11-01 NOTE — Progress Notes (Signed)
NP Craige Cotta notified concerning patient's b/p this am. Patient is due for a few blood pressure medication this morning. Will pass on to morning RN.

## 2014-11-01 NOTE — Care Management (Signed)
CARE MANAGEMENT NOTE 11/01/2014  Patient:  LOTA, LEAMER   Account Number:  [id number]  Date Initiated:  11/01/2014  Documentation initiated by:  Georgianne Fick  Subjective/Objective Assessment:   Patient is from [facility]  Admitted with diagnosis of Cellulitis abscess of legs, Acute Ancephalopathy  Currebtly on antibiotics.     Action/Plan:   Anticipated DC Date:  11/02/2014   Anticipated DC Plan:  ASSISTED LIVING / REST HOME      DC Planning Services  CM consult      Choice offered to / List presented to:             Status of service:  In process, will continue to follow Medicare Important Message given?  YES (If response is "NO", the following Medicare IM given date fields will be blank) Date Medicare IM given:  11/01/2014 Medicare IM given by:  Georgianne Fick Date Additional Medicare IM given:   Additional Medicare IM given by:    Discharge Disposition:    Per UR Regulation:    If discussed at Long Length of Stay Meetings, dates discussed:    Comments:  11/01/14. 1143 Georgianne Fick, RN, BSN, CCM.  Patient is from [facility]. Diagnosed with Cellilitis of legs, Acute Ancelopathy, currently on antibiotics.  Plan is to discharge on 11/02/14.  PT and Wound to evaluate.   No need of NCM need anticipated at this time, will continue to follow up for any discharge needs.

## 2014-11-01 NOTE — Evaluation (Signed)
Physical Therapy Evaluation Patient Details Name: Brittany Schultz MRN: [id number] DOB: 02/09/1922 Today's Date: 11/01/2014   History of Present Illness  Brittany Schultz is a 80 y.o. year old female presenting with cellulitis, encephalopathy.  Her PMH is significant for peripheral vascular diseases, venous stasis ulcers, a fib on coumadin, lymphedema.    Clinical Impression  Patient presents with decreased mobility due to deficits listed in PT problem list.  She will benefit from skilled PT in the acute setting to allow return to ALF.  Not sure of her previous functional level.  May function okay at ALF with HHPT; but may need SNF level rehab if unable to have physical assist for transfers.      Follow Up Recommendations SNF;Outpatient PT;Supervision/Assistance - 24 hour    Equipment Recommendations  None recommended by PT    Recommendations for Other Services       Precautions / Restrictions Precautions Precautions: Fall      Mobility  Bed Mobility               General bed mobility comments: pt in recliner  Transfers Overall transfer level: Needs assistance Equipment used: Rolling walker (2 wheeled) Transfers: Sit to/from Stand Sit to Stand: Max assist;Min assist         General transfer comment: attempted twice with walker and max assist to assist pt to stand with time spent to encourage flexion and anterior weight shift; then tried stedy and pt able to stand with min to minguard assist; used stedy to pivot pt to/from [facility]  Ambulation/Gait                Stairs            Wheelchair Mobility    Modified Rankin (Stroke Patients Only)       Balance Overall balance assessment: Needs assistance   Sitting balance-Leahy Scale: Fair Sitting balance - Comments: still leans posteriorly when seated edge of chair way from back of seat Postural control: Posterior lean Standing balance support: Bilateral upper extremity supported Standing  balance-Leahy Scale: Poor Standing balance comment: standing holding onto stedy with minguard assist; initially too posterior to stand                             Pertinent Vitals/Pain Pain Assessment: Faces Faces Pain Scale: Hurts even more Pain Location: left hip with attempts to mobilize Pain Intervention(s): Limited activity within patient's tolerance;Repositioned    Home Living Family/patient expects to be discharged to:: Assisted living               Home Equipment: Walker - 2 wheels      Prior Function Level of Independence: Independent with assistive device(s)         Comments: was at [facility]     Hand Dominance        Extremity/Trunk Assessment               Lower Extremity Assessment: RLE deficits/detail;LLE deficits/detail RLE Deficits / Details: AROM limited due to significant OA bilateral; left hip most limited with c/o pain with attempts to mobilize; strength grossly 4/5;  legs both with UNNA boots LLE Deficits / Details: AROM limited due to significant OA bilateral; left hip most limited with c/o pain with attempts to mobilize; strength grossly 4/5; legs both with UNNA boots  Cervical / Trunk Assessment: Other exceptions  Communication      Cognition Arousal/Alertness: Awake/alert Behavior  During Therapy: WFL for tasks assessed/performed Overall Cognitive Status: No family/caregiver present to determine baseline cognitive functioning                      General Comments      Exercises General Exercises - Lower Extremity Long Arc Quad: AROM;Both;5 reps;Seated Other Exercises Other Exercises: trunk flexion/extension AAROM seated x 5      Assessment/Plan    PT Assessment Patient needs continued PT services  PT Diagnosis Difficulty walking;Generalized weakness   PT Problem List Decreased mobility;Decreased activity tolerance;Decreased balance;Decreased knowledge of use of DME;Decreased safety  awareness;Decreased range of motion;Decreased strength  PT Treatment Interventions DME instruction;Therapeutic exercise;Gait training;Balance training;Functional mobility training;Therapeutic activities;Patient/family education   PT Goals (Current goals can be found in the Care Plan section) Acute Rehab PT Goals Patient Stated Goal: none stated PT Goal Formulation: Patient unable to participate in goal setting Time For Goal Achievement: 11/15/14 Potential to Achieve Goals: Fair    Frequency Min 3X/week   Barriers to discharge        Co-evaluation               End of Session Equipment Utilized During Treatment: Gait belt Activity Tolerance: Patient limited by pain Patient left: in chair;with call bell/phone within reach;with chair alarm set           Time: 1455-1525 PT Time Calculation (min) (ACUTE ONLY): 30 min   Charges:   PT Evaluation $Initial PT Evaluation Tier I: 1 Procedure PT Treatments $Therapeutic Activity: 8-22 mins   PT G Codes:          Jovanie Verge,CYNDI 15-Nov-2014, 4:55 PM Sheran Lawless, PT [phone] 15-Nov-2014

## 2014-11-01 NOTE — Progress Notes (Signed)
On call NP Craige Cotta notified of pt's stat head CT results.

## 2014-11-01 NOTE — Consult Note (Signed)
WOC wound consult note Reason for Consult: LE wounds.  Pt poor historian and no family in the room.  Wound type: venous stasis wounds bilaterally  Measurement: Left pretibial: 3cmx 3cm x 0.2cm  Left posterior: 8cm x 5cm x 0.2cm  Right leg with circumferential venous stasis ulceration that extends between 9-10cm  Wound bed: the wounds are mostly pink but with fibrin scattered over the RLE wound and the L posterior wound.  The left pretibial wound is 100% yellow fibrin.   Drainage (amount, consistency, odor)  Moderate to heavy, yellow-green drainage. The RLE does have an odor but the wounds do not appear grossly infected.  Periwound: intact with some erythema  Dressing procedure/placement/frequency: Silver hydrofiber to the wounds bilateral for the bioburdan and exudate management.  Unna's boots for venous stasis compression, change 2x wk this week and WOC will reeval wounds at the next change Friday.  WOC will follow along with you for wound assessments.  Pt will need follow up at DC for Unna's boots and wound care.  Asheley Hellberg [place] RN,CWOCN [phone]

## 2014-11-01 NOTE — Progress Notes (Signed)
Patient Demographics  Brittany Schultz, is a 80 y.o. female, DOB - 05-13-1922, [id number]  Admit date - 10/30/2014   Admitting Physician Rhetta Mura, MD  Outpatient Primary MD for the patient is GREEN, Lenon Curt, MD  LOS - 2   No chief complaint on file.       Subjective:   [name] today has, No headache, No chest pain, No abdominal pain - No Nausea, No new weakness tingling or numbness, No Cough - SOB.    Assessment & Plan    Brief history 80 year old female with a history of peripheral vascular disease, lower extremity lymphedema, atrial fibrillation on Coumadin, and hypothyroidism presented with increasing lower extremity edema, drainage, and confusion. The patient was discharged from the emergency department on 10/24/2014 with similar complaints. CT of the brain that time was negative. The patient was discharged with doxycycline. Since that time, there is pain complaints of increasing lower extremity edema and drainage. In addition, the patient recently received a left hip steroid injection prior to this hospitalization. The patient was started on Cipro and vancomycin and was admitted for her presumptive cellulitis of the legs and acute encephalopathy.  Assessment/Plan:   Cellulitis of the lower extremity -Certainly, the patient may have had a mild component of secondary infection, but I believe that most of her changes in her legs may be due to venous stasis dermatitis, continue vancomycin. Continue wound care per wound care team. Has UNNA boots, continue diuresis.     Lymphedema -in part due to fluid overload--see below, urine protein creatinine ratio slightly elevated. Outpatient renal follow-up one time. Not consistent with gross proteinuria. Continue diuretics.      Acute Diastolic CHF Daily on chronic diastolic CHF, 05/05/14 echo--EF 65-70%, continue Lasix and Aldactone and monitor. On fluid and salt restriction, daily weights.     Acute metabolic/toxic encephalopathy likely has underlying dementia as well -Multifactorial including the patient's CHF, anticholinergic medications, recent steroid menstruation, and infectious process, supportive care. Stable. At risk for delirium.   Atrial fibrillation -Rate controlled. Continue Coreg and Coumadin. Pharmacy monitoring INR.    Hypothyroidism -Continue Synthroid   Essential hypertension.  On Coreg and diuretics, will add hydralazine for better control.     Code Status: DNR  Family Communication: none present  Disposition Plan: SNF   Procedures    Venous US  - No evidence of deep vein thrombosis involving the visualized veins of the right lower extremity. - No evidence of deep vein thrombosis involving the visualized veins of the left lower extremity. - Unable to fully evaluate calf veins due to bandages. - No evidence of Baker&'s cyst on the right or left.    Consults  W care   Medications  Scheduled Meds: . acetaminophen  650 mg Oral BID  . amitriptyline  30 mg Oral QHS  . carvedilol  6.25 mg Oral BID WC  . furosemide  20 mg Intravenous BID  . gabapentin  100 mg Oral QHS  . levothyroxine  125 mcg Oral QAC breakfast  . sodium chloride  3 mL Intravenous Q12H  . spironolactone  25 mg Oral Daily  . vancomycin  1,500 mg Intravenous Q24H  . Warfarin - Pharmacist Dosing Inpatient   Does not apply [phone]  Continuous Infusions:  PRN Meds:.acetaminophen, hydrOXYzine  DVT Prophylaxis  Couamdin  Lab Results  Component Value Date   INR 1.92* 11/01/2014   INR 1.50* 10/31/2014   INR 1.41 10/30/2014     Lab Results  Component Value Date   PLT 260 10/30/2014    Antibiotics    Anti-infectives    Start     Dose/Rate Route Frequency Ordered Stop   10/31/14 0130   ciprofloxacin (CIPRO) IVPB 400 mg  Status:  Discontinued     400 mg200 mL/hr over 60 Minutes Intravenous Every 12 hours 10/31/14 0108 10/31/14 1300   10/30/14 2100  vancomycin (VANCOCIN) 1,500 mg in sodium chloride 0.9 % 500 mL IVPB     1,500 mg250 mL/hr over 120 Minutes Intravenous Every 24 hours 10/30/14 2033            Objective:   Filed Vitals:   10/31/14 2134 11/01/14 0419 11/01/14 0557 11/01/14 0630  BP: 134/62  165/73 162/71  Pulse: 70  70 71  Temp: 97.5 F (36.4 C)  98.1 F (36.7 C)   TempSrc: Oral  Oral   Resp: 17  20   Height:      Weight:  90.3 kg (199 lb 1.2 oz)    SpO2: 95%  98%     Wt Readings from Last 3 Encounters:  11/01/14 90.3 kg (199 lb 1.2 oz)  10/24/14 93.441 kg (206 lb)  08/15/14 95.255 kg (210 lb)     Intake/Output Summary (Last 24 hours) at 11/01/14 1104 Last data filed at 11/01/14 0936  Gross per 24 hour  Intake    240 ml  Output    200 ml  Net     40 ml     Physical Exam  Awake Alert, Oriented X 2, No new F.N deficits, Normal affect [place].AT,PERRAL Supple Neck,No JVD, No cervical lymphadenopathy appriciated.  Symmetrical Chest wall movement, Good air movement bilaterally, CTAB RRR,No Gallops,Rubs or new Murmurs, No Parasternal Heave +ve B.Sounds, Abd Soft, No tenderness, No organomegaly appriciated, No rebound - guarding or rigidity. No Cyanosis, Clubbing or edema, No new Rash or bruise , Both legs in UNNA boots( see w care notes)    Data Review   Micro Results Recent Results (from the past 240 hour(s))  MRSA PCR Screening     Status: None   Collection Time: 10/30/14  7:46 PM  Result Value Ref Range Status   MRSA by PCR NEGATIVE NEGATIVE Final    Comment:        The GeneXpert MRSA Assay (FDA approved for NASAL specimens only), is one component of a comprehensive MRSA colonization surveillance program. It is not intended to diagnose MRSA infection nor to guide or monitor treatment for MRSA infections.     Radiology  Reports Dg Chest 2 View  10/30/2014   CLINICAL DATA:  Encephalopathy.  Shortness of breath.  EXAM: CHEST  2 VIEW  COMPARISON:  10/24/2014  FINDINGS: Mild cardiac enlargement and pulmonary vascular congestion. At the with blunting of the costophrenic angles suggesting small effusions. Hazy infiltration in the lung bases likely due to early edema. No pneumothorax. Calcified and tortuous aorta. Degenerative changes in the spine and shoulders.  IMPRESSION: Cardiac enlargement with mild pulmonary vascular congestion, small effusions, and early edema pattern.   Electronically Signed   By: Burman Nieves M.D.   On: 10/30/2014 22:15   Ct Head Wo Contrast  10/30/2014   CLINICAL DATA:  Confusion  EXAM: CT HEAD WITHOUT CONTRAST  TECHNIQUE: Contiguous  axial images were obtained from the base of the skull through the vertex without intravenous contrast.  COMPARISON:  10/24/2014  FINDINGS: Bony calvarium is intact. There are in this lucency identified within multiple venous structures in the right orbit as well as the musculature surrounding the mandible and right face. This likely is related to small of the air emboli related to IV insertion. This was not present on the prior exam. Mild atrophic changes are seen. Mild chronic white matter ischemic change is noted. No findings to suggest acute hemorrhage, acute infarction or space-occupying mass lesion are noted.  IMPRESSION: Small amounts of venous air in the soft tissues of the face and orbit suggestive of air emboli during IV placement.  No acute intracranial abnormality is noted.  Chronic atrophic changes.   Electronically Signed   By: Alcide Clever M.D.   On: 10/30/2014 22:30        CBC  Recent Labs Lab 10/30/14 2052  WBC 10.9*  HGB 12.0  HCT 38.6  PLT 260  MCV 84.8  MCH 26.4  MCHC 31.1  RDW 18.6*  LYMPHSABS 1.3  MONOABS 0.9  EOSABS 0.2  BASOSABS 0.0    Chemistries   Recent Labs Lab 10/31/14 0627 11/01/14 0725  NA 135* 137  K 3.9 4.1    CL 99 100  CO2 23 25  GLUCOSE 92 83  BUN 20 18  CREATININE 0.70 0.74  CALCIUM 8.5 8.8  AST 13 18  ALT 11 12  ALKPHOS 81 85  BILITOT 0.7 0.6   ------------------------------------------------------------------------------------------------------------------ estimated creatinine clearance is 55 mL/min (by C-G formula based on Cr of 0.74). ------------------------------------------------------------------------------------------------------------------ No results for input(s): HGBA1C in the last 72 hours. ------------------------------------------------------------------------------------------------------------------ No results for input(s): CHOL, HDL, LDLCALC, TRIG, CHOLHDL, LDLDIRECT in the last 72 hours. ------------------------------------------------------------------------------------------------------------------ No results for input(s): TSH, T4TOTAL, T3FREE, THYROIDAB in the last 72 hours.  Invalid input(s): FREET3 ------------------------------------------------------------------------------------------------------------------ No results for input(s): VITAMINB12, FOLATE, FERRITIN, TIBC, IRON, RETICCTPCT in the last 72 hours.  Coagulation profile  Recent Labs Lab 10/30/14 2052 10/31/14 0627 11/01/14 0725  INR 1.41 1.50* 1.92*    No results for input(s): DDIMER in the last 72 hours.  Cardiac Enzymes No results for input(s): CKMB, TROPONINI, MYOGLOBIN in the last 168 hours.  Invalid input(s): CK ------------------------------------------------------------------------------------------------------------------ Invalid input(s): POCBNP     Time Spent in minutes  35   Annebelle Bostic K M.D on 11/01/2014 at 11:04 AM  Between 7am to 7pm - Pager - [phone]  After 7pm go to [email or web address] - password [email or web address]  And look for the night coverage person covering for me after hours  [facility] Office  [phone]

## 2014-11-01 NOTE — Progress Notes (Signed)
Echocardiogram 2D Echocardiogram has been performed.  Brittany Schultz 11/01/2014, 11:06 AM

## 2014-11-01 NOTE — Progress Notes (Signed)
Orthopedic Tech Progress Note Patient Details:  Brittany Schultz [birth year] [id number]  Ortho Devices Type of Ortho Device: Roland Rack boot Ortho Device/Splint Location: bilateral Ortho Device/Splint Interventions: Application   Nikki Dom 11/01/2014, 12:53 PM

## 2014-11-01 NOTE — Progress Notes (Signed)
CT head performed secondary to family's concern over pt being more confused. CT head neg for acute changes. Pt is here with diagnosis of encephalopathy of multiple etiologies. RN says she is alert and is partially oriented. No change in treatment needed at this time.

## 2014-11-01 NOTE — Progress Notes (Signed)
ANTICOAGULATION CONSULT NOTE - Follow Up Consult  Pharmacy Consult for Coumadin Indication: atrial fibrillation  Allergies  Allergen Reactions  . Sulfa Antibiotics Rash    Rash w/ sulfonomides furosemide, meloxicam  . Codeine     unknown  . Penicillins     unknown  . Sulfonylureas     unknown   Labs:  Recent Labs  10/30/14 2052 10/31/14 0627 11/01/14 0725  HGB 12.0  --   --   HCT 38.6  --   --   PLT 260  --   --   LABPROT 17.4* 18.3* 22.1*  INR 1.41 1.50* 1.92*  CREATININE  --  0.70 0.74    Estimated Creatinine Clearance: 55 mL/min (by C-G formula based on Cr of 0.74).  Assessment: 80 year old female admitted with cellulitis and leg wounds continues on home Coumadin for Afib.   INR trending up = 1.92 today  Goal of Therapy:  INR 2-3 Monitor platelets by anticoagulation protocol: Yes   Plan:  Coumadin 2.5 mg po daily at 1800 pm Follow up AM INR  Thank you. Okey Regal, PharmD [phone]  11/01/2014,11:14 AM

## 2014-11-01 NOTE — Plan of Care (Signed)
Problem: Phase III Progression Outcomes Goal: IV/normal saline lock discontinued Outcome: Completed/Met Date Met:  11/01/14  Problem: Problem: Cardiovascular Progression Goal: NO EVIDENCE OF VOLUME OVERLOAD Outcome: Progressing

## 2014-11-02 DIAGNOSIS — I4891 Unspecified atrial fibrillation: Secondary | ICD-10-CM

## 2014-11-02 LAB — COMPREHENSIVE METABOLIC PANEL
ALT: 12 U/L (ref 0–35)
AST: 15 U/L (ref 0–37)
Albumin: 2.9 g/dL — ABNORMAL LOW (ref 3.5–5.2)
Alkaline Phosphatase: 87 U/L (ref 39–117)
Anion gap: 14 (ref 5–15)
BILIRUBIN TOTAL: 0.7 mg/dL (ref 0.3–1.2)
BUN: 19 mg/dL (ref 6–23)
CHLORIDE: 99 meq/L (ref 96–112)
CO2: 27 mEq/L (ref 19–32)
Calcium: 8.8 mg/dL (ref 8.4–10.5)
Creatinine, Ser: 0.77 mg/dL (ref 0.50–1.10)
GFR calc Af Amer: 83 mL/min — ABNORMAL LOW (ref >90)
GFR calc non Af Amer: 72 mL/min — ABNORMAL LOW (ref >90)
Glucose, Bld: 80 mg/dL (ref 70–99)
Potassium: 3.7 mEq/L (ref 3.7–5.3)
Sodium: 140 mEq/L (ref 137–147)
TOTAL PROTEIN: 7.5 g/dL (ref 6.0–8.3)

## 2014-11-02 LAB — PROTIME-INR
INR: 2.15 — ABNORMAL HIGH (ref 0.00–1.49)
PROTHROMBIN TIME: 24.2 s — AB (ref 11.6–15.2)

## 2014-11-02 LAB — URINE CULTURE: Colony Count: 35000

## 2014-11-02 MED ORDER — HYDRALAZINE HCL 25 MG PO TABS: 25.0000 mg | ORAL_TABLET | Freq: Three times a day (TID) | ORAL | Status: DC

## 2014-11-02 MED ORDER — DOXYCYCLINE HYCLATE 100 MG PO CAPS: 100.0000 mg | ORAL_CAPSULE | Freq: Two times a day (BID) | ORAL | Status: DC

## 2014-11-02 MED ORDER — WARFARIN SODIUM 2 MG PO TABS
2.0000 mg | ORAL_TABLET | Freq: Every day | ORAL | Status: DC
Start: 2014-11-02 — End: 2018-11-04

## 2014-11-02 MED ORDER — FUROSEMIDE 40 MG PO TABS: 40.0000 mg | ORAL_TABLET | Freq: Every day | ORAL | Status: DC

## 2014-11-02 NOTE — Clinical Social Work Placement (Signed)
Clinical Social Work Department CLINICAL SOCIAL WORK PLACEMENT NOTE 11/02/2014  Patient:  Brittany Schultz, Brittany Schultz  Account Number:  [id number] Admit date:  10/30/2014  Clinical Social Worker:  Cherre Blanc, [place]  Date/time:  11/02/2014 12:23 PM  Clinical Social Work is seeking post-discharge placement for this patient at the following level of care:   SKILLED NURSING   (*CSW will update this form in Epic as items are completed)   11/02/2014  Patient/family provided with [facility] Department of Clinical Social Work's list of facilities offering this level of care within the geographic area requested by the patient (or if unable, by the patient's family).  11/02/2014  Patient/family informed of their freedom to choose among providers that offer the needed level of care, that participate in Medicare, Medicaid or managed care program needed by the patient, have an available bed and are willing to accept the patient.  11/02/2014  Patient/family informed of [facility]' ownership interest in [facility], as well as of the fact that they are under no obligation to receive care at this facility.  PASARR submitted to EDS on 11/02/2014 PASARR number received on 11/02/2014  FL2 transmitted to all facilities in geographic area requested by pt/family on  11/02/2014 FL2 transmitted to all facilities within larger geographic area on   Patient informed that his/her managed care company has contracts with or will negotiate with  certain facilities, including the following:     Patient/family informed of bed offers received:  11/02/2014 Patient chooses bed at  Physician recommends and patient chooses bed at    Patient to be transferred to  on   Patient to be transferred to facility by  Patient and family notified of transfer on  Name of family member notified:    The following physician request were entered in Epic:   Additional Comments:    Roddie Mc MSW, [place],  [place], [phone]

## 2014-11-02 NOTE — Clinical Social Work Note (Signed)
CSW left messages with patient's family contacts. CSW attempted to assess patient at bedside but patient would not respond to CSW. Patient is from [facility]. CSW has sent information to facility for their review.   Roddie Mc MSW, [place], [place], [phone]

## 2014-11-02 NOTE — Care Management Note (Signed)
    Page 1 of 1   11/02/2014     4:34:56 PM CARE MANAGEMENT NOTE 11/02/2014  Patient:  Brittany Schultz, Brittany Schultz   Account Number:  [id number]  Date Initiated:  11/01/2014  Documentation initiated by:  Georgianne Fick  Subjective/Objective Assessment:   Patient is from [facility]  Admitted with diagnosis of Cellulitis abscess of legs, Acute Ancephalopathy  Currebtly on antibiotics.     Action/Plan:   Anticipated DC Date:  11/02/2014   Anticipated DC Plan:  SKILLED NURSING FACILITY  In-house referral  Clinical Social Worker      DC Planning Services  CM consult      Choice offered to / List presented to:             Status of service:  Completed, signed off Medicare Important Message given?  YES (If response is "NO", the following Medicare IM given date fields will be blank) Date Medicare IM given:  11/01/2014 Medicare IM given by:  Georgianne Fick Date Additional Medicare IM given:   Additional Medicare IM given by:    Discharge Disposition:  SKILLED NURSING FACILITY  Per UR Regulation:  Reviewed for med. necessity/level of care/duration of stay  If discussed at Long Length of Stay Meetings, dates discussed:    Comments:  11/02/14 1525 Letha Cape RN, BSN [phone] patient is for dc to snf, CSW following.  Per CSW granddaughter and her spouse are out looking at snf's, NCM called and spoke with Loraine Leriche , her spouse, and informed him that the pateint has been discharged and they will need to let the CSW know which facility she will be going to so CSW can set up transport, Loraine Leriche states that they have one more to look at,  he asked if this would be an unsafe dc, NCM informed him since he has several offers of snf's this would not be an unsafe dc.  11/01/14. 1143 Georgianne Fick, RN, BSN, CCM.  Patient is from [facility]. Diagnosed with Cellilitis of legs, Acute Ancelopathy, currently on antibiotics.  Plan is to discharge on 11/02/14.  PT and Wound to evaluate.   No need of NCM need  anticipated at this time, will continue to follow up for any discharge needs.

## 2014-11-02 NOTE — Clinical Social Work Note (Signed)
CSW has received call from [facility] stating that the patient is no longer a resident of their facility as of yesterday. CSW has left multiple messages with the patient's family members listed in Epic as the patient will not be able to return to [facility] and the patient will need a new facility to DC to unless she will be going home with family members.   Roddie Mc MSW, [place], [place], [phone]

## 2014-11-02 NOTE — Discharge Instructions (Signed)
Follow with Primary MD GREEN, Lenon Curt, MD in 7 days   Get CBC, CMP, Leg wounds checked  by Primary MD or SNF MD  next visit.    Activity: As tolerated with Full fall precautions use walker/cane & assistance as needed   Disposition SNF  Diet: Heart Healthy  with feeding assistance and aspiration precautions as needed.  Check your Weight same time everyday, if you gain over 2 pounds, or you develop in leg swelling, experience more shortness of breath or chest pain, call your Primary MD immediately. Follow Cardiac Low Salt Diet and 1.5 lit/day fluid restriction.   On your next visit with your primary care physician please Get Medicines reviewed and adjusted.   Please request your Prim.MD to go over all Hospital Tests and Procedure/Radiological results at the follow up, please get all Hospital records sent to your Prim MD by signing hospital release before you go home.   If you experience worsening of your admission symptoms, develop shortness of breath, life threatening emergency, suicidal or homicidal thoughts you must seek medical attention immediately by calling 911 or calling your MD immediately  if symptoms less severe.  You Must read complete instructions/literature along with all the possible adverse reactions/side effects for all the Medicines you take and that have been prescribed to you. Take any new Medicines after you have completely understood and accpet all the possible adverse reactions/side effects.   Do not drive, operating heavy machinery, perform activities at heights, swimming or participation in water activities or provide baby sitting services if your were admitted for syncope or siezures until you have seen by Primary MD or a Neurologist and advised to do so again.  Do not drive when taking Pain medications.    Do not take more than prescribed Pain, Sleep and Anxiety Medications  Special Instructions: If you have smoked or chewed Tobacco  in the last 2 yrs please  stop smoking, stop any regular Alcohol  and or any Recreational drug use.  Wear Seat belts while driving.   Please note  You were cared for by a hospitalist during your hospital stay. If you have any questions about your discharge medications or the care you received while you were in the hospital after you are discharged, you can call the unit and asked to speak with the hospitalist on call if the hospitalist that took care of you is not available. Once you are discharged, your primary care physician will handle any further medical issues. Please note that NO REFILLS for any discharge medications will be authorized once you are discharged, as it is imperative that you return to your primary care physician (or establish a relationship with a primary care physician if you do not have one) for your aftercare needs so that they can reassess your need for medications and monitor your lab values.

## 2014-11-02 NOTE — Progress Notes (Signed)
ANTICOAGULATION CONSULT NOTE - Follow Up Consult  Pharmacy Consult for Coumadin Indication: atrial fibrillation  Allergies  Allergen Reactions  . Sulfa Antibiotics Rash    Rash w/ sulfonomides furosemide, meloxicam  . Codeine     unknown  . Penicillins     unknown  . Sulfonylureas     unknown   Labs:  Recent Labs  10/30/14 2052 10/31/14 0627 11/01/14 0725 11/02/14 0605  HGB 12.0  --   --   --   HCT 38.6  --   --   --   PLT 260  --   --   --   LABPROT 17.4* 18.3* 22.1* 24.2*  INR 1.41 1.50* 1.92* 2.15*  CREATININE  --  0.70 0.74 0.77    Estimated Creatinine Clearance: 54.4 mL/min (by C-G formula based on Cr of 0.77).  Assessment: 80 year old female admitted with cellulitis and leg wounds continues on home Coumadin for Afib.   INR 2.15  No bleeding complications noted  Goal of Therapy:  INR 2-3 Monitor platelets by anticoagulation protocol: Yes   Plan:  Coumadin 2.5 mg po daily at 1800 pm Follow up AM INR  Thanks for allowing pharmacy to be a part of this patient's care.  Talbert Cage, PharmD Clinical Pharmacist, [phone]  11/02/2014,9:04 AM

## 2014-11-02 NOTE — Clinical Social Work Psychosocial (Signed)
Clinical Social Work Department BRIEF PSYCHOSOCIAL ASSESSMENT 11/02/2014  Patient:  Brittany Schultz, Brittany Schultz     Account Number:  [id number]     Admit date:  10/30/2014  Clinical Social Worker:  Lavell Luster  Date/Time:  11/02/2014 11:30 AM  Referred by:  Physician  Date Referred:  11/02/2014 Referred for  SNF Placement   Other Referral:   NA   Interview type:  Family Other interview type:   Patient's daughter and son in law interviewed by phone as no faimly at bedside.    PSYCHOSOCIAL DATA Living Status:  FACILITY Admitted from facility:  [facility] Level of care:  Assisted Living Primary support name:  Brittany Schultz and Brittany Schultz Primary support relationship to patient:  FAMILY Degree of support available:   Support is strong.    CURRENT CONCERNS Current Concerns  Post-Acute Placement   Other Concerns:   NA    SOCIAL WORK ASSESSMENT / PLAN CSW spoke with patient's family (Rena and Bayonne) by phone to complete assessment. Family states that the patient was admitted from [facility], but the patient is no longer a resident of this facility as of yesterday (11/02/14). Family states they are looking for rehab placement for patient with eventual transition to long term care at whichever facility she discharges too. CSW explained SNF search/placement process and answered family's questions. Patient's family is very supportive of family wants what is best for her. CSW explained that the patient is medically stable for discharge so the family will need to make this decision quickly. CSW will follow up with the family at 2 pm to get their decision. CSW will assist.   Assessment/plan status:  Psychosocial Support/Ongoing Assessment of Needs Other assessment/ plan:   Complete Fl2, Fax, PASRR   Information/referral to community resources:   CSW contact information and bed offers given. CSW explained the expenses of long term care and encouraged the family to apply for Medicaid if they  feel she will qualify.    PATIENT'S/FAMILY'S RESPONSE TO PLAN OF CARE: Patient's family plans for the patient to DC to SNF for rehab with eventual transition to long term care. CSW will assist as appropriate.       Roddie Mc MSW, [place], [place], [phone]

## 2014-11-02 NOTE — Plan of Care (Signed)
Problem: Consults Goal: Nutrition Consult-if indicated Outcome: Not Applicable Date Met:  94/50/38 Goal: Diabetes Guidelines if Diabetic/Glucose > 140 If diabetic or lab glucose is > 140 mg/dl - Initiate Diabetes/Hyperglycemia Guidelines & Document Interventions  Outcome: Not Applicable Date Met:  88/28/00  Problem: Phase I Progression Outcomes Goal: Voiding-avoid urinary catheter unless indicated Outcome: Completed/Met Date Met:  11/02/14 Goal: Hemodynamically stable Outcome: Completed/Met Date Met:  11/02/14  Problem: Phase II Progression Outcomes Goal: Progress activity as tolerated unless otherwise ordered Outcome: Progressing Goal: Vital signs remain stable Outcome: Completed/Met Date Met:  11/02/14 Goal: IV changed to normal saline lock Outcome: Completed/Met Date Met:  11/02/14  Problem: Phase III Progression Outcomes Goal: Pain controlled on oral analgesia Outcome: Completed/Met Date Met:  11/02/14  Problem: Discharge Progression Outcomes Goal: Pain controlled with appropriate interventions Outcome: Completed/Met Date Met:  11/02/14 Goal: Hemodynamically stable Outcome: Completed/Met Date Met:  11/02/14

## 2014-11-02 NOTE — Clinical Social Work Placement (Addendum)
Clinical Social Work Department CLINICAL SOCIAL WORK PLACEMENT NOTE 11/02/2014  Patient: Brittany Schultz, Brittany Schultz Account Number: [id number] Admit date: 10/30/2014  Clinical Social Worker: Cherre Blanc, [place] Date/time: 11/02/2014 12:23 PM  Clinical Social Work is seeking post-discharge placement for this patient at the following level of care: SKILLED NURSING (*CSW will update this form in Epic as items are completed)   11/02/2014 Patient/family provided with [facility] Department of Clinical Social Work's list of facilities offering this level of care within the geographic area requested by the patient (or if unable, by the patient's family).  11/02/2014 Patient/family informed of their freedom to choose among providers that offer the needed level of care, that participate in Medicare, Medicaid or managed care program needed by the patient, have an available bed and are willing to accept the patient.  11/02/2014 Patient/family informed of [facility]' ownership interest in [facility], as well as of the fact that they are under no obligation to receive care at this facility.  PASARR submitted to EDS on 11/02/2014 PASARR number received on 11/02/2014  FL2 transmitted to all facilities in geographic area requested by pt/family on 11/02/2014 FL2 transmitted to all facilities within larger geographic area on   Patient informed that his/her managed care company has contracts with or will negotiate with certain facilities, including the following:    Patient/family informed of bed offers received: 11/02/2014 Patient chooses bed at [facility] Physician recommends and patient chooses bed at   Patient to be transferred to [facility] on 11/02/14 Patient to be transferred to facility by Ambulance Patient and family notified of transfer on 11/02/14 Name of family member notified: Loraine Leriche and Artelia Laroche  The following physician request were entered in  Epic:   Additional Comments: \ [facility] RN, patient, patient's family (family aware that EMS transport could take 2+hours for pickup), and facility notified of DC. RN given number for report. DC packet on chart. [facility] transport requested for patient. CSW signing off.    Roddie Mc MSW, [place], [place], [phone]

## 2014-11-02 NOTE — Discharge Summary (Signed)
Brittany Schultz, is a 80 y.o. female  DOB Jun 30, 1922  MRN [id number].  Admission date:  10/30/2014  Admitting Physician  Rhetta Mura, MD  Discharge Date:  11/02/2014   Primary MD  Kimber Relic, MD  Recommendations for primary care physician for things to follow:    check CBC, BMP, INR, Leg wounds in 2-4 days   Admission Diagnosis  cellulitis abscess of legs   Discharge Diagnosis  cellulitis abscess of legs    Active Problems:   Venous stasis ulcer of left lower extremity   Cellulitis   Acute CHF   Acute encephalopathy   Atrial fibrillation      Past Medical History  Diagnosis Date  . CHF (congestive heart failure)   . Unspecified hypothyroidism   . Hypertension   . Osteoarthritis of left hip   . A-fib     hospitalized 04/2009  . PVD (peripheral vascular disease)   . Venous stasis ulcer of left lower extremity   . Venous insufficiency of both lower extremities     vascular evaluation 10/2013, 01/2014 Dr.Powell  . Dermatitis 01/2014  . History of shingles   . Open wound of left lower leg 02/2104  . Long term (current) use of anticoagulants     warfarin for stroke risk reduction re: AF  . Vitamin D deficiency   . Hyperlipidemia   . Hx of colonic polyps     Adenomatous polyps  . Unstable gait   . Lack of coordination   . Other malaise and fatigue   . Muscle weakness (generalized)   . Peripheral vascular disease, unspecified   . Varicose veins of lower extremities with ulcer   . Thrombocytopenia April 2015    144,000  . Deaf   . Shortness of breath 03/23/14    Past Surgical History  Procedure Laterality Date  . Cataract extraction w/ intraocular lens  implant, bilateral Bilateral        History of present illness and  Hospital Course:     Kindly see H&P for history of present illness and  admission details, please review complete Labs, Consult reports and Test reports for all details in brief   HPI  12 80 year old female with a history of peripheral vascular disease, lower extremity lymphedema, atrial fibrillation on Coumadin, and hypothyroidism presented with increasing lower extremity edema, drainage, and confusion. The patient was discharged from the emergency department on 10/24/2014 with similar complaints. CT of the brain that time was negative. The patient was discharged with doxycycline. Since that time, there is pain complaints of increasing lower extremity edema and drainage. In addition, the patient recently received a left hip steroid injection prior to this hospitalization. The patient was started on Cipro and vancomycin and was admitted for her presumptive cellulitis of the legs and acute encephalopathy.   Hospital Course    Cellulitis of the lower extremity -Certainly, the patient may have had a mild component of secondary infection, but I believe that most of her changes in her legs may be  due to venous stasis dermatitis, treated with vancomycin and will switch to PO Doxy x 10 days, will need W care and UNNA boots along with Laasix and Aldactone, monitor CBC, BMP and leg wounds closely at SNF.      Lymphedema -in part due to fluid overload--see below, urine protein creatinine ratio slightly elevated. Outpatient renal follow-up one time. Not consistent with gross proteinuria. Continue diuretics follow BMP closely.    Acute Diastolic CHF Daily on chronic diastolic CHF, 05/05/14 echo--EF 65-70%, continue Lasix and Aldactone and monitor. Place on  fluid and salt restriction, daily weights at SNF.    Acute metabolic/toxic encephalopathy likely has mild early underlying dementia as well -Multifactorial including the patient's CHF, anticholinergic medications, recent steroid menstruation, and infectious process, supportive care along with delirium due to illness and  new setting, supportive Rx, minimize narcotics and benzo use.    Atrial fibrillation -Rate controlled. Continue Coreg and Coumadin. Monitor INR.    Hypothyroidism Continue Synthroid    Essential hypertension. On Coreg and diuretics, will add hydralazine for better control.      Discharge Condition: stable   Follow UP  Follow-up Information    Follow up with GREEN, Lenon Curt, MD. Schedule an appointment as soon as possible for a visit in 1 week.   Specialty:  Internal Medicine   Contact information:   [address] [place] [address] [phone]         Discharge Instructions  and  Discharge Medications      Discharge Instructions    Discharge instructions    Complete by:  As directed   Follow with Primary MD GREEN, Lenon Curt, MD in 2-4 days   Get CBC, CMP, INR , Leg wounds checked  by Primary MD or SNF MD  next visit.    Activity: As tolerated with Full fall precautions use walker/cane & assistance as needed   Disposition SNF  Diet: Heart Healthy  with feeding assistance and aspiration precautions as needed.  Check your Weight same time everyday, if you gain over 2 pounds, or you develop in leg swelling, experience more shortness of breath or chest pain, call your Primary MD immediately. Follow Cardiac Low Salt Diet and 1.5 lit/day fluid restriction.   On your next visit with your primary care physician please Get Medicines reviewed and adjusted.   Please request your Prim.MD to go over all Hospital Tests and Procedure/Radiological results at the follow up, please get all Hospital records sent to your Prim MD by signing hospital release before you go home.   If you experience worsening of your admission symptoms, develop shortness of breath, life threatening emergency, suicidal or homicidal thoughts you must seek medical attention immediately by calling 911 or calling your MD immediately  if symptoms less severe.  You Must read complete  instructions/literature along with all the possible adverse reactions/side effects for all the Medicines you take and that have been prescribed to you. Take any new Medicines after you have completely understood and accpet all the possible adverse reactions/side effects.   Do not drive, operating heavy machinery, perform activities at heights, swimming or participation in water activities or provide baby sitting services if your were admitted for syncope or siezures until you have seen by Primary MD or a Neurologist and advised to do so again.  Do not drive when taking Pain medications.    Do not take more than prescribed Pain, Sleep and Anxiety Medications  Special Instructions: If you have smoked or  chewed Tobacco  in the last 2 yrs please stop smoking, stop any regular Alcohol  and or any Recreational drug use.  Wear Seat belts while driving.   Please note  You were cared for by a hospitalist during your hospital stay. If you have any questions about your discharge medications or the care you received while you were in the hospital after you are discharged, you can call the unit and asked to speak with the hospitalist on call if the hospitalist that took care of you is not available. Once you are discharged, your primary care physician will handle any further medical issues. Please note that NO REFILLS for any discharge medications will be authorized once you are discharged, as it is imperative that you return to your primary care physician (or establish a relationship with a primary care physician if you do not have one) for your aftercare needs so that they can reassess your need for medications and monitor your lab values.     Increase activity slowly    Complete by:  As directed             Medication List    TAKE these medications        acetaminophen 325 MG tablet  Commonly known as:  TYLENOL  Take 650 mg by mouth 2 (two) times daily. May have additional 650mg  PO q 6hr as  needed for pain     amitriptyline 10 MG tablet  Commonly known as:  ELAVIL  Take 30 mg by mouth at bedtime.     carvedilol 6.25 MG tablet  Commonly known as:  COREG  Take 6.25 mg by mouth 2 (two) times daily with a meal.     cholecalciferol 1000 UNITS tablet  Commonly known as:  VITAMIN D  Take 1,000 Units by mouth daily. Take 2,000 units daily     clobetasol ointment 0.05 %  Commonly known as:  TEMOVATE  Apply 1 application topically 2 (two) times daily. Apply oint to skin twice daily for itching     diclofenac 1.3 % Ptch  Commonly known as:  FLECTOR  Place 1 patch onto the skin as needed (pain).     doxycycline 100 MG capsule  Commonly known as:  VIBRAMYCIN  Take 1 capsule (100 mg total) by mouth 2 (two) times daily. For 10 more days     furosemide 40 MG tablet  Commonly known as:  LASIX  Take 1 tablet (40 mg total) by mouth daily.     gabapentin 100 MG capsule  Commonly known as:  NEURONTIN  Take 100 mg by mouth at bedtime.     hydrALAZINE 25 MG tablet  Commonly known as:  APRESOLINE  Take 1 tablet (25 mg total) by mouth every 8 (eight) hours.     hydrOXYzine 25 MG tablet  Commonly known as:  ATARAX/VISTARIL  Take 1 tablet (25 mg total) by mouth every 8 (eight) hours as needed.     levothyroxine 125 MCG tablet  Commonly known as:  SYNTHROID, LEVOTHROID  Take 125 mcg by mouth daily before breakfast.     NONFORMULARY OR COMPOUNDED ITEM  Take by mouth 2 (two) times daily. Takes GARDEN BLEND; VINEYARD BLEND; ORCHARD BLEND; juice plus vitamins twice daily     NONFORMULARY OR COMPOUNDED ITEM  Take 1 tablet by mouth 2 (two) times daily. Orchard Blend     NONFORMULARY OR COMPOUNDED ITEM  Take 1 tablet by mouth daily. Vineyard Blend     polyethylene glycol  packet  Commonly known as:  MIRALAX / GLYCOLAX  Take 17 g by mouth daily as needed for mild constipation.     spironolactone 25 MG tablet  Commonly known as:  ALDACTONE  Take 1 tablet (25 mg total) by mouth  daily.     warfarin 2 MG tablet  Commonly known as:  COUMADIN  Take 1 tablet (2 mg total) by mouth daily. INR checked in 2 days          Diet and Activity recommendation: See Discharge Instructions above   Consults obtained - W Care   Major procedures and Radiology Reports - PLEASE review detailed and final reports for all details, in brief -       Dg Chest 2 View  10/30/2014   CLINICAL DATA:  Encephalopathy.  Shortness of breath.  EXAM: CHEST  2 VIEW  COMPARISON:  10/24/2014  FINDINGS: Mild cardiac enlargement and pulmonary vascular congestion. At the with blunting of the costophrenic angles suggesting small effusions. Hazy infiltration in the lung bases likely due to early edema. No pneumothorax. Calcified and tortuous aorta. Degenerative changes in the spine and shoulders.  IMPRESSION: Cardiac enlargement with mild pulmonary vascular congestion, small effusions, and early edema pattern.   Electronically Signed   By: Burman Nieves M.D.   On: 10/30/2014 22:15   Ct Head Wo Contrast  11/01/2014   CLINICAL DATA:  Increase confusion  EXAM: CT HEAD WITHOUT CONTRAST  TECHNIQUE: Contiguous axial images were obtained from the base of the skull through the vertex without intravenous contrast.  COMPARISON:  10/30/2014  FINDINGS: The bony calvarium is intact. The soft tissue areas of air are no longer identified. Mild atrophic changes are again noted. These are stable from the prior exam. No findings to suggest acute hemorrhage, acute infarction or space-occupying mass lesion are noted.  IMPRESSION: Chronic atrophic changes. No acute abnormality is seen. No significant change from prior study is noted.   Electronically Signed   By: Alcide Clever M.D.   On: 11/01/2014 19:11   Ct Head Wo Contrast  10/30/2014   CLINICAL DATA:  Confusion  EXAM: CT HEAD WITHOUT CONTRAST  TECHNIQUE: Contiguous axial images were obtained from the base of the skull through the vertex without intravenous contrast.   COMPARISON:  10/24/2014  FINDINGS: Bony calvarium is intact. There are in this lucency identified within multiple venous structures in the right orbit as well as the musculature surrounding the mandible and right face. This likely is related to small of the air emboli related to IV insertion. This was not present on the prior exam. Mild atrophic changes are seen. Mild chronic white matter ischemic change is noted. No findings to suggest acute hemorrhage, acute infarction or space-occupying mass lesion are noted.  IMPRESSION: Small amounts of venous air in the soft tissues of the face and orbit suggestive of air emboli during IV placement.  No acute intracranial abnormality is noted.  Chronic atrophic changes.   Electronically Signed   By: Alcide Clever M.D.   On: 10/30/2014 22:30   Ct Head Wo Contrast  10/24/2014   CLINICAL DATA:  Altered mental status  EXAM: CT HEAD WITHOUT CONTRAST  TECHNIQUE: Contiguous axial images were obtained from the base of the skull through the vertex without intravenous contrast.  COMPARISON:  None.  FINDINGS: There is no evidence of mass effect, midline shift, or extra-axial fluid collections. There is no evidence of a space-occupying lesion or intracranial hemorrhage. There is no evidence of a cortical-based  area of acute infarction. There is generalized cerebral atrophy. There is periventricular white matter low attenuation likely secondary to microangiopathy.  The ventricles and sulci are appropriate for the patient's age. The basal cisterns are patent.  Visualized portions of the orbits are unremarkable. The visualized portions of the paranasal sinuses and mastoid air cells are unremarkable.  The osseous structures are unremarkable.  IMPRESSION: No acute intracranial pathology.   Electronically Signed   By: Elige Ko   On: 10/24/2014 17:09       Micro Results      Recent Results (from the past 240 hour(s))  MRSA PCR Screening     Status: None   Collection Time:  10/30/14  7:46 PM  Result Value Ref Range Status   MRSA by PCR NEGATIVE NEGATIVE Final    Comment:        The GeneXpert MRSA Assay (FDA approved for NASAL specimens only), is one component of a comprehensive MRSA colonization surveillance program. It is not intended to diagnose MRSA infection nor to guide or monitor treatment for MRSA infections.   Culture, Urine     Status: None   Collection Time: 10/30/14 10:42 PM  Result Value Ref Range Status   Specimen Description URINE, RANDOM  Final   Special Requests NONE  Final   Culture  Setup Time   Final    10/31/2014 13:11 Performed at [facility] Count   Final    35,000 COLONIES/ML Performed at [facility]    Culture   Final    ENTEROCOCCUS SPECIES Performed at [facility]    Report Status 11/02/2014 FINAL  Final   Organism ID, Bacteria ENTEROCOCCUS SPECIES  Final      Susceptibility   Enterococcus species - MIC*    AMPICILLIN <=2 SENSITIVE Sensitive     LEVOFLOXACIN 1 SENSITIVE Sensitive     NITROFURANTOIN <=16 SENSITIVE Sensitive     VANCOMYCIN 1 SENSITIVE Sensitive     TETRACYCLINE >=16 RESISTANT Resistant     * ENTEROCOCCUS SPECIES  Wound culture     Status: None (Preliminary result)   Collection Time: 11/01/14 10:00 AM  Result Value Ref Range Status   Specimen Description WOUND LEG LEFT  Final   Special Requests PRETIBIAL LEFT LEG  Final   Gram Stain   Final    RARE WBC PRESENT,BOTH PMN AND MONONUCLEAR NO SQUAMOUS EPITHELIAL CELLS SEEN NO ORGANISMS SEEN Performed at [facility]    Culture   Final    Culture reincubated for better growth Performed at [facility]    Report Status PENDING  Incomplete  Wound culture     Status: None (Preliminary result)   Collection Time: 11/01/14 11:16 AM  Result Value Ref Range Status   Specimen Description WOUND LEG RIGHT  Final   Special Requests NONE  Final   Gram Stain   Final    RARE WBC PRESENT,BOTH PMN AND  MONONUCLEAR RARE SQUAMOUS EPITHELIAL CELLS PRESENT NO ORGANISMS SEEN Performed at [facility]    Culture   Final    Culture reincubated for better growth Performed at [facility]    Report Status PENDING  Incomplete  Wound culture     Status: None (Preliminary result)   Collection Time: 11/01/14 11:17 AM  Result Value Ref Range Status   Specimen Description WOUND LEG LEFT  Final   Special Requests POSTERIOR LEFT LEG  Final   Gram Stain   Final  NO WBC SEEN NO SQUAMOUS EPITHELIAL CELLS SEEN NO ORGANISMS SEEN Performed at [facility]    Culture   Final    Culture reincubated for better growth Performed at [facility]    Report Status PENDING  Incomplete       Today   Subjective:   Brittany Schultz today has no headache,no chest abdominal pain,no new weakness tingling or numbness, feels much better wants to go home today.    Objective:   Blood pressure 155/65, pulse 69, temperature 98.4 F (36.9 C), temperature source Oral, resp. rate 20, height 5\' 8"  (1.727 m), weight 88.5 kg (195 lb 1.7 oz), SpO2 97 %.   Intake/Output Summary (Last 24 hours) at 11/02/14 1048 Last data filed at 11/01/14 2330  Gross per 24 hour  Intake    230 ml  Output      0 ml  Net    230 ml    Exam Awake,  Oriented x 1, No new F.N deficits, Normal affect [place].AT,PERRAL Supple Neck,No JVD, No cervical lymphadenopathy appriciated.  Symmetrical Chest wall movement, Good air movement bilaterally, CTAB RRR,No Gallops,Rubs or new Murmurs, No Parasternal Heave +ve B.Sounds, Abd Soft, Non tender, No organomegaly appriciated, No rebound -guarding or rigidity. No Cyanosis, Clubbing or edema, No new Rash or bruise Both legs in UNNA boots, minimal erythema  Data Review   CBC w Diff: Lab Results  Component Value Date   WBC 10.9* 10/30/2014   WBC 8.9 02/16/2014   HGB 12.0 10/30/2014   HCT 38.6 10/30/2014   PLT 260 10/30/2014   LYMPHOPCT 12 10/30/2014   MONOPCT  8 10/30/2014   EOSPCT 2 10/30/2014   BASOPCT 0 10/30/2014    CMP: Lab Results  Component Value Date   NA 140 11/02/2014   NA 139 05/11/2014   K 3.7 11/02/2014   CL 99 11/02/2014   CO2 27 11/02/2014   BUN 19 11/02/2014   BUN 19 05/11/2014   CREATININE 0.77 11/02/2014   CREATININE 0.8 05/11/2014   GLU 79 05/11/2014   PROT 7.5 11/02/2014   ALBUMIN 2.9* 11/02/2014   BILITOT 0.7 11/02/2014   ALKPHOS 87 11/02/2014   AST 15 11/02/2014   ALT 12 11/02/2014  .  Lab Results  Component Value Date   INR 2.15* 11/02/2014   INR 1.92* 11/01/2014   INR 1.50* 10/31/2014    Total Time in preparing paper work, data evaluation and todays exam - 35 minutes  11/02/2014 M.D on 11/02/2014 at 10:48 AM  [facility] Office  [phone]

## 2014-11-04 LAB — WOUND CULTURE: Gram Stain: NONE SEEN

## 2014-11-05 LAB — WOUND CULTURE

## 2016-06-06 ENCOUNTER — Institutional Professional Consult (permissible substitution): Payer: PRIVATE HEALTH INSURANCE | Admitting: Internal Medicine

## 2016-07-01 ENCOUNTER — Encounter: Payer: Self-pay | Admitting: Internal Medicine

## 2016-07-01 ENCOUNTER — Ambulatory Visit (INDEPENDENT_AMBULATORY_CARE_PROVIDER_SITE_OTHER)
Admission: RE | Admit: 2016-07-01 | Discharge: 2016-07-01 | Disposition: A | Discharge status: Home / Self Care | Payer: Medicare Other | Source: Ambulatory Visit | Attending: Internal Medicine | Admitting: Internal Medicine

## 2016-07-01 ENCOUNTER — Ambulatory Visit (INDEPENDENT_AMBULATORY_CARE_PROVIDER_SITE_OTHER): Payer: Medicare Other | Admitting: Internal Medicine

## 2016-07-01 ENCOUNTER — Other Ambulatory Visit (INDEPENDENT_AMBULATORY_CARE_PROVIDER_SITE_OTHER): Payer: Medicare Other

## 2016-07-01 VITALS — BP 140/70 | HR 62 | Ht 67.0 in | Wt 183.0 lb

## 2016-07-01 DIAGNOSIS — R05 Cough: Secondary | ICD-10-CM

## 2016-07-01 DIAGNOSIS — J948 Other specified pleural conditions: Secondary | ICD-10-CM | POA: Diagnosis not present

## 2016-07-01 DIAGNOSIS — R058 Other specified cough: Secondary | ICD-10-CM

## 2016-07-01 DIAGNOSIS — J90 Pleural effusion, not elsewhere classified: Secondary | ICD-10-CM

## 2016-07-01 LAB — CBC WITH DIFFERENTIAL/PLATELET
BASOS PCT: 0.6 % (ref 0.0–3.0)
Basophils Absolute: 0 10*3/uL (ref 0.0–0.1)
Eosinophils Absolute: 0.3 10*3/uL (ref 0.0–0.7)
Eosinophils Relative: 4.1 % (ref 0.0–5.0)
HEMATOCRIT: 40.1 % (ref 36.0–46.0)
Hemoglobin: 13.5 g/dL (ref 12.0–15.0)
LYMPHS PCT: 20.6 % (ref 12.0–46.0)
Lymphs Abs: 1.3 10*3/uL (ref 0.7–4.0)
MCHC: 33.8 g/dL (ref 30.0–36.0)
MCV: 93 fl (ref 78.0–100.0)
MONOS PCT: 9.6 % (ref 3.0–12.0)
Monocytes Absolute: 0.6 10*3/uL (ref 0.1–1.0)
NEUTROS ABS: 4.2 10*3/uL (ref 1.4–7.7)
Neutrophils Relative %: 65.1 % (ref 43.0–77.0)
Platelets: 224 10*3/uL (ref 150.0–400.0)
RBC: 4.31 Mil/uL (ref 3.87–5.11)
RDW: 15 % (ref 11.5–15.5)
WBC: 6.4 10*3/uL (ref 4.0–10.5)

## 2016-07-01 NOTE — Progress Notes (Signed)
Quick Note:  Spoke with pt and notified of results per Dr. Wert. Pt verbalized understanding and denied any questions.  ______ 

## 2016-07-01 NOTE — Progress Notes (Signed)
Subjective:     Patient ID: Brittany Schultz, female   DOB: Jul 21, 1922, 82 y.o.   MRN: [id number]  HPI  47 yowf never smoker with new onset cough around 03/2016  referred to pulmonary clinic 07/01/2016 by Dr Rhunette Croft  07/01/2016 1st [facility] office visit/ Brittany Schultz   Chief Complaint  Patient presents with  . Pulmonary Consult    Referred by Dr. Kerry Dory. Pt c/o cough and congestion for the past 2-3 months. She states that her voice has completely changed. Cough is mainly non prod.   in assisted living at [facility] x one year with a new problem with cough x 3 m indolent onset persistent daily  assoc with nasal congestion and hoarseness and cough immediately at hs but doesn't keep her up or wake up with it . Also bothered by lots of itching  ? X months before onset of cough/ no sneezing or rhinorrhea or previous h/o cough/wheeze.  No doe vut very sedentary   No obvious day to day or daytime variability or assoc excess/ purulent sputum or mucus plugs or hemoptysis or cp or chest tightness, subjective wheeze or overt sinus or hb symptoms. No unusual exp hx or h/o childhood pna/ asthma or knowledge of premature birth.  Sleeping ok without nocturnal  or early am exacerbation  of respiratory  c/o's or need for noct saba. Also denies any obvious fluctuation of symptoms with weather or environmental changes or other aggravating or alleviating factors except as outlined above   Current Medications, Allergies, Complete Past Medical History, Past Surgical History, Family History, and Social History were reviewed in [employer] record.  ROS  The following are not active complaints unless bolded sore throat, dysphagia, dental problems, itching, sneezing,  nasal congestion or excess/ purulent secretions, ear ache,   fever, chills, sweats, unintended wt loss, classically pleuritic or exertional cp,  orthopnea pnd or leg swelling, presyncope, palpitations, abdominal pain, anorexia,  nausea, vomiting, diarrhea  or change in bowel or bladder habits, change in stools or urine, dysuria,hematuria,  rash, arthralgias, visual complaints, headache, numbness, weakness or ataxia or problems with walking or coordination,  change in mood/affect or memory.          Review of Systems     Objective:   Physical Exam     w/c bound elderly wf with nasal tone to voice  Wt Readings from Last 3 Encounters:  07/01/16 183 lb (83.008 kg)  11/02/14 195 lb 1.7 oz (88.5 kg)  10/24/14 206 lb (93.441 kg)    Vital signs reviewed    HEENT: nl dentition, turbinates, and oropharynx. Nl external ear canals without cough reflex   NECK :  without JVD/Nodes/TM/ nl carotid upstrokes bilaterally   LUNGS: no acc muscle use,  Nl contour chest with decreased bs R base with dullness - no cough on insp/exp  CV:  RRR  no s3 or murmur or increase in P2, no edema   ABD:  soft and nontender with nl inspiratory excursion in the supine position. No bruits or organomegaly, bowel sounds nl  MS:  Nl gait/ ext warm without deformities, calf tenderness, cyanosis or clubbing No obvious joint restrictions   SKIN: warm and dry without lesions    NEURO:  alert, approp, nl sensorium with  no motor deficits     CXR PA and Lateral:   07/01/2016 :    I personally reviewed images and agree with radiology impression as follows:   Chronic effusion/atx R Base  Labs ordered/ cbc with diff/ eos/ allergy profile                   Assessment:

## 2016-07-01 NOTE — Assessment & Plan Note (Addendum)
1st noted 10/24/2014  - echo 11/01/2014  - Left ventricle: The cavity size was normal. Wall thickness was normal. Systolic function was normal. The estimated ejection fraction was in the range of 55% to 60%. Wall motion was normal; there were no regional wall motion abnormalities. - Mitral valve: There was mild regurgitation. - Left atrium: The atrium was mildly dilated. - Right ventricle: The cavity size was moderately dilated. Systolic function was mildly reduced. - Right atrium: The atrium was mildly dilated. - Atrial septum: No defect or patent foramen ovale was identified. - Tricuspid valve: There was moderate regurgitation directed centrally. - Pulmonary arteries: Systolic pressure was moderately increased. PA peak pressure: 63 mm Hg (S)  Obviously chronic ? Related to prev pna/ ? Low grade chf or  Pleural scar but doubt the cause of the cough or for that matter any concern at this point   Discussed in detail all the  indications, usual  risks and alternatives  relative to the benefits with patient who agrees to proceed with conservative f/u as outlined  - recheck bnp/esr next ov

## 2016-07-01 NOTE — Patient Instructions (Addendum)
Please see patient coordinator before you leave today  to schedule sinus CT   Pantoprazole (protonix) 40 mg   Take  30-60 min before first meal of the day and Pepcid (famotidine)  20 mg one @  bedtime until return to office - this is the best way to tell whether stomach acid is contributing to your problem.    GERD (REFLUX)  is an extremely common cause of respiratory symptoms just like yours , many times with no obvious heartburn at all.    It can be treated with medication, but also with lifestyle changes including elevation of the head of your bed (ideally with 6 inch  bed blocks),  Smoking cessation, avoidance of late meals, excessive alcohol, and avoid fatty foods, chocolate, peppermint, colas, red wine, and acidic juices such as orange juice.  NO MINT OR MENTHOL PRODUCTS SO NO COUGH DROPS  USE SUGARLESS CANDY INSTEAD (Jolley ranchers or Stover's or Life Savers) or even ice chips will also do - the key is to swallow to prevent all throat clearing. NO OIL BASED VITAMINS - use powdered substitutes.  Please remember to go to the lab and x-ray department downstairs for your tests - we will call you with the results when they are available. - recheck bnp/esr next ov re effusion on R

## 2016-07-01 NOTE — Assessment & Plan Note (Signed)
Max gerd diet/ ppi/ h2 hs  07/01/2016 >>>  Allergy profile 07/01/2016 >  Eos 0. /  IgE   - sinus CT 07/01/2016 >>>    The most common causes of chronic cough in immunocompetent adults include the following: upper airway cough syndrome (UACS), previously referred to as postnasal drip syndrome (PNDS), which is caused by variety of rhinosinus conditions; (2) asthma; (3) GERD; (4) chronic bronchitis from cigarette smoking or other inhaled environmental irritants; (5) nonasthmatic eosinophilic bronchitis; and (6) bronchiectasis.   These conditions, singly or in combination, have accounted for up to 94% of the causes of chronic cough in prospective studies.   Other conditions have constituted no >6% of the causes in prospective studies These have included bronchogenic carcinoma, chronic interstitial pneumonia, sarcoidosis, left ventricular failure, ACEI-induced cough, and aspiration from a condition associated with pharyngeal dysfunction.    Chronic cough is often simultaneously caused by more than one condition. A single cause has been found from 38 to 82% of the time, multiple causes from 18 to 62%. Multiply caused cough has been the result of three diseases up to 42% of the time.       Based on hx and exam, this is most likely:  Classic Upper airway cough syndrome, so named because it's frequently impossible to sort out how much is  CR/sinusitis with freq throat clearing (which can be related to primary GERD)   vs  causing  secondary (" extra esophageal")  GERD from wide swings in gastric pressure that occur with throat clearing, often  promoting self use of mint and menthol lozenges that reduce the lower esophageal sphincter tone and exacerbate the problem further in a cyclical fashion.   These are the same pts (now being labeled as having "irritable larynx syndrome" by some cough centers) who not infrequently have a history of having failed to tolerate ace inhibitors,  dry powder inhalers or  biphosphonates or report having atypical reflux symptoms that don't respond to standard doses of PPI , and are easily confused as having aecopd or asthma flares by even experienced allergists/ pulmonologists.   The first step is to maximize acid suppression and evaluate for sinus dz/ allergic rhnitis and f/u in 6 weeks to regroup  I had an extended discussion with the patient and granddaughter reviewing all relevant studies completed to date and  lasting 35 min/60 min initial eval   Each maintenance medication was reviewed in detail including most importantly the difference between maintenance and as needed and under what circumstances the prns are to be used.  Please see instructions for details which were reviewed in writing and the patient given a copy.

## 2016-07-02 ENCOUNTER — Telehealth: Payer: Self-pay | Admitting: Internal Medicine

## 2016-07-02 ENCOUNTER — Encounter: Payer: Self-pay | Admitting: Internal Medicine

## 2016-07-02 LAB — RESPIRATORY ALLERGY PROFILE REGION II ~~LOC~~
Allergen, Cedar tree, t12: <0.1 kU/L
Allergen, Cottonwood, t14: <0.1 kU/L
Allergen, Mouse Urine Protein, e78: <0.1 kU/L
Allergen, Oak,t7: <0.1 kU/L
Alternaria Alternata: <0.1 kU/L
Box Elder IgE: <0.1 kU/L
Cat Dander: <0.1 kU/L
IGE (IMMUNOGLOBULIN E), SERUM: 57 kU/L (ref <115)
Pecan/Hickory Tree IgE: <0.1 kU/L
Rough Pigweed  IgE: <0.1 kU/L
Sheep Sorrel IgE: <0.1 kU/L
Timothy Grass: <0.1 kU/L

## 2016-07-02 NOTE — Progress Notes (Signed)
Quick Note:  lmtcb ______ 

## 2016-07-02 NOTE — Telephone Encounter (Signed)
Spoke with patient's granddaughter regarding lab results. Nothing further needed.

## 2016-07-08 ENCOUNTER — Inpatient Hospital Stay: Admission: RE | Admit: 2016-07-08 | Payer: Medicare Other | Source: Ambulatory Visit

## 2016-07-10 ENCOUNTER — Ambulatory Visit (INDEPENDENT_AMBULATORY_CARE_PROVIDER_SITE_OTHER)
Admission: RE | Admit: 2016-07-10 | Discharge: 2016-07-10 | Disposition: A | Discharge status: Home / Self Care | Payer: Medicare Other | Source: Ambulatory Visit | Attending: Internal Medicine | Admitting: Internal Medicine

## 2016-07-10 DIAGNOSIS — R05 Cough: Secondary | ICD-10-CM

## 2016-07-11 ENCOUNTER — Other Ambulatory Visit: Payer: Self-pay | Admitting: Internal Medicine

## 2016-07-11 DIAGNOSIS — R058 Other specified cough: Secondary | ICD-10-CM

## 2016-07-11 DIAGNOSIS — R05 Cough: Secondary | ICD-10-CM

## 2016-07-11 NOTE — Progress Notes (Signed)
Spoke with Rena, pt's POA and notified of results/recs and she verbalized understanding

## 2016-07-15 ENCOUNTER — Other Ambulatory Visit: Payer: Medicare Other

## 2018-07-23 ENCOUNTER — Emergency Department (HOSPITAL_COMMUNITY)
Admission: EM | Admit: 2018-07-23 | Discharge: 2018-07-23 | Disposition: A | Discharge status: Home / Self Care | Payer: Medicare Other | Attending: Emergency Medicine | Admitting: Emergency Medicine

## 2018-07-23 ENCOUNTER — Encounter (HOSPITAL_COMMUNITY): Payer: Self-pay

## 2018-07-23 DIAGNOSIS — I110 Hypertensive heart disease with heart failure: Secondary | ICD-10-CM | POA: Diagnosis not present

## 2018-07-23 DIAGNOSIS — I509 Heart failure, unspecified: Secondary | ICD-10-CM | POA: Insufficient documentation

## 2018-07-23 DIAGNOSIS — E039 Hypothyroidism, unspecified: Secondary | ICD-10-CM | POA: Insufficient documentation

## 2018-07-23 DIAGNOSIS — Z79899 Other long term (current) drug therapy: Secondary | ICD-10-CM | POA: Diagnosis not present

## 2018-07-23 DIAGNOSIS — T7840XA Allergy, unspecified, initial encounter: Secondary | ICD-10-CM

## 2018-07-23 MED ORDER — PREDNISONE 20 MG PO TABS: 40.0000 mg | ORAL_TABLET | Freq: Once | ORAL | 0 refills | Status: AC

## 2018-07-23 MED ORDER — DOXYCYCLINE HYCLATE 100 MG PO CAPS: 100.0000 mg | ORAL_CAPSULE | Freq: Two times a day (BID) | ORAL | 0 refills | Status: DC

## 2018-07-23 MED ORDER — PREDNISONE 20 MG PO TABS
40.0000 mg | ORAL_TABLET | Freq: Once | ORAL | Status: AC
  Administered 2018-07-23: 40 mg via ORAL
  Filled 2018-07-23: qty 2

## 2018-07-23 NOTE — ED Notes (Signed)
bacitracin and gauze applied to ankle wound

## 2018-07-23 NOTE — Discharge Instructions (Addendum)
Watch for signs of worsening infection or worsening allergy.  The doxycycline can sometimes make your blood extra thin on the Coumadin.  Follow-up with her doctors. It is okay to use the Therahoney on the wound.

## 2018-07-23 NOTE — ED Provider Notes (Signed)
[facility] Provider Note   CSN: [id number] Arrival date & time:        History   Chief Complaint Chief Complaint  Patient presents with  . Allergic Reaction  . Cellulitis  . Ankle Pain    HPI [facility] is a 84 y.o. female.  HPI Patient has history of wound on her right lower leg.  Has been treated with topical antibiotics but recently switched over to oral antibiotics.  Had first doses of clindamycin either last night or today.  Today developed redness and itchiness.  No difficulty breathing.  Reportedly has allergies to several different medications.  It is unknown if she has had clindamycin in the past.  Mild pain on the right lower leg.  No fevers.  There was question of a injury to the right lower leg previously but no recent injury. Past Medical History:  Diagnosis Date  . A-fib [facility])    hospitalized 04/2009  . CHF (congestive heart failure) (HCC)   . Deaf   . Dermatitis 01/2014  . History of shingles   . Hx of colonic polyps    Adenomatous polyps  . Hyperlipidemia   . Hypertension   . Lack of coordination   . Long term (current) use of anticoagulants    warfarin for stroke risk reduction re: AF  . Muscle weakness (generalized)   . Open wound of left lower leg 02/2104  . Osteoarthritis of left hip   . Other malaise and fatigue   . Peripheral vascular disease, unspecified (HCC)   . PVD (peripheral vascular disease) (HCC)   . Shortness of breath 03/23/14  . Thrombocytopenia (HCC) April 2015   144,000  . Unspecified hypothyroidism   . Unstable gait   . Varicose veins of lower extremities with ulcer (HCC)   . Venous insufficiency of both lower extremities    vascular evaluation 10/2013, 01/2014 Dr.Powell  . Venous stasis ulcer of left lower extremity (HCC)   . Vitamin D deficiency     Patient Active Problem List   Diagnosis Date Noted  . Upper airway cough syndrome 07/01/2016  . Pleural effusion on right 07/01/2016    . Acute CHF (HCC) 10/31/2014  . Acute encephalopathy 10/31/2014  . Atrial fibrillation (HCC) 10/31/2014  . Subacute confusional state 10/30/2014  . Cellulitis 10/30/2014  . 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 Lymphedema 04/20/2014  . Debility 03/30/2014  . Shortness of breath 03/23/2014  . Deaf   . Varicose veins of lower extremities with ulcer (HCC)   . Peripheral vascular disease (HCC)   . Muscle weakness (generalized)   . Other malaise and fatigue   . Lack of coordination   . Unstable gait   . Hyperlipidemia   . Venous insufficiency of both lower extremities   . Open wound of left lower leg   . Hypertension   . Osteoarthritis of left hip   . Long term current use of anticoagulant therapy   . Thrombocytopenia (HCC) 03/15/2014  . Cellulitis and abscess of leg, except foot 02/25/2014  . Generalized weakness 02/21/2014  . Unspecified constipation 02/21/2014  . Dermatitis 02/21/2014  . Unspecified hypothyroidism 02/21/2014  . Essential hypertension, benign 02/19/2014  . Congestive heart failure (HCC) 02/19/2014  . A-fib (HCC) 02/19/2014  . Macular rash 02/19/2014  . Osteoarthritis 02/19/2014  . Hypothyroidism 02/19/2014  . Venous stasis ulcer of left lower extremity (HCC) 02/19/2014    Past Surgical History:  Procedure Laterality Date  . CATARACT EXTRACTION W/ INTRAOCULAR LENS  IMPLANT, BILATERAL Bilateral      OB History   None      Home Medications    Prior to Admission medications   Medication Sig Start Date End Date Taking? Authorizing Provider  acetaminophen (TYLENOL) 325 MG tablet Take 650 mg by mouth 3 (three) times daily.  03/17/14  Yes Krell, Claudette T, NP  carvedilol (COREG) 6.25 MG tablet Take 6.25 mg by mouth 2 (two) times daily with a meal.   Yes [provider]  diclofenac sodium (VOLTAREN) 1 % GEL Apply 2 g topically 3 (three) times daily. Both lower extremities 06/25/18  Yes [provider]  furosemide (LASIX) 40 MG tablet Take 1 tablet (40 mg total) by mouth daily. 11/02/14  Yes Leroy Sea, MD  hydrALAZINE (APRESOLINE) 25 MG tablet Take 1 tablet (25 mg total) by mouth every 8 (eight) hours. Patient taking differently: Take 25 mg by mouth 2 (two) times daily.  11/02/14  Yes Leroy Sea, MD  levothyroxine (SYNTHROID, LEVOTHROID) 100 MCG tablet Take 100 mcg by mouth at bedtime.    Yes [provider]  mineral oil external liquid Place 2 drops into both ears once a week.   Yes [provider]  mupirocin ointment (BACTROBAN) 2 % Apply 1 application topically daily. Cleanse right lower leg with normal saline, pat dry, apply Bactroban ointment (Mupirocin) to wound ber, cover with non 07/14/18  Yes [provider]  Nutritional Supplements (JUICE PLUS FIBRE PO) Take 1 capsule by mouth See admin instructions. Take one capsule of each three times a day [employer]   Yes [provider]  potassium chloride SA (K-DUR,KLOR-CON) 20 MEQ tablet Take 20 mEq by mouth daily. 07/01/18  Yes [provider]  rivaroxaban (XARELTO) 20 MG TABS tablet Take 20 mg by mouth daily with supper.   Yes [provider]  Skin Protectants, Misc. (EUCERIN) cream Apply 1 application topically 2 (two) times daily.   Yes [provider]  spironolactone (ALDACTONE) 25 MG tablet Take 1 tablet (25 mg total) by mouth daily. 04/20/14  Yes Krell, Claudette T, NP  vitamin B-12 (CYANOCOBALAMIN) 1000 MCG tablet Take 1,000 mcg by mouth daily.   Yes [provider]  VITAMIN D, ERGOCALCIFEROL, PO Take 1,000 Units by mouth every 30 (thirty) days.    Yes [provider]  doxycycline (VIBRAMYCIN) 100 MG capsule Take 1 capsule (100 mg total) by mouth 2 (two) times daily. 07/23/18   Benjiman Core, MD  hydrOXYzine (ATARAX/VISTARIL) 25 MG tablet Take 1 tablet (25 mg total) by mouth every 8 (eight) hours as  needed. Patient not taking: Reported on 07/23/2018 04/20/14   Maxwell Marion T, NP  predniSONE (DELTASONE) 20 MG tablet Take 2 tablets (40 mg total) by mouth once for 1 dose. 07/24/18 07/24/18  Benjiman Core, MD  warfarin (COUMADIN) 2 MG tablet Take 1 tablet (2 mg total) by mouth daily. INR checked in 2 days Patient not taking: Reported on 07/23/2018 11/02/14   Leroy Sea, MD    Family History Family History  Problem Relation Age of Onset  . Hypertension Mother   . Stroke Mother   . Heart disease Mother   . Hypertension Father   . Hypertension Brother     Social History Social History   Tobacco Use  . Smoking status: Never Smoker  . Smokeless tobacco: Never Used  Substance Use Topics  .  Alcohol use: No  . Drug use: No     Allergies   Sulfa antibiotics; Codeine; Penicillins; Sulfonylureas; and Clindamycin/lincomycin   Review of Systems Review of Systems  Constitutional: Negative for appetite change.  HENT: Negative for congestion.   Cardiovascular: Negative for chest pain.  Gastrointestinal: Negative for abdominal pain.  Genitourinary: Negative for flank pain.  Musculoskeletal: Negative for back pain.  Skin: Positive for rash and wound.  Neurological: Negative for weakness.  Psychiatric/Behavioral: Negative for confusion.     Physical Exam Updated Vital Signs BP 130/89   Pulse 81   Temp 98 F (36.7 C) (Oral)   Resp 19   Ht 5\' 8"  (1.727 m)   Wt 83 kg   SpO2 96%   BMI 27.82 kg/m   Physical Exam  Constitutional: She appears well-developed.  HENT:  Mouth/Throat: No oropharyngeal exudate.  No posterior pharyngeal edema.  Eyes: EOM are normal.  Cardiovascular: Normal rate.  Pulmonary/Chest: She has no wheezes.  Abdominal: There is no tenderness.  Musculoskeletal:  Chronic venous changes bilateral lower extremities.  There are potential petechiae on both lower legs but this is reportedly chronic.  Right lateral lower leg has an approximate 3 cm wound  with some granulation tissue.  There is diffuse erythema over much of her body without induration.  Neurological: She is alert.  Skin: Capillary refill takes less than 2 seconds.     ED Treatments / Results  Labs (all labs ordered are listed, but only abnormal results are displayed) Labs Reviewed - No data to display  EKG None  Radiology No results found.  Procedures Procedures (including critical care time)  Medications Ordered in ED Medications  predniSONE (DELTASONE) tablet 40 mg (40 mg Oral Given 07/23/18 1003)     Initial Impression / Assessment and Plan / ED Course  I have reviewed the triage vital signs and the nursing notes.  Pertinent labs & imaging results that were available during my care of the patient were reviewed by me and considered in my medical decision making (see chart for details).     Patient with likely reaction to clindamycin.  She has been sensitive to antibiotics in the past.  During time in the ER redness is much improved.  No airway involvement.  No vomiting.  Will discharge home.  Discussed with patient and her daughter.  Will change over to doxycycline.  Will need to closely monitor her INR.  Discharge home.  Final Clinical Impressions(s) / ED Diagnoses   Final diagnoses:  Allergic reaction, initial encounter    ED Discharge Orders         Ordered    predniSONE (DELTASONE) 20 MG tablet   Once     07/23/18 1248    doxycycline (VIBRAMYCIN) 100 MG capsule  2 times daily     07/23/18 1248           09/22/18, MD 07/23/18 1249

## 2018-07-23 NOTE — ED Notes (Signed)
Per pt's granddaughter, pt's it more swollen in her face than normal

## 2018-07-23 NOTE — ED Triage Notes (Signed)
Pt from [facility] home via EMS; Pt being treated for cellulitis w/ clindamycin (1st treatment), pt has red skin, c/o itching on trunk; pt also c/o R ankle pain; per pt, staff member "acciently" kicked R ankle  Pt given 50 Benadryl PTA    136/70  P 78 95% CBG 91  98.40F

## 2018-10-29 ENCOUNTER — Emergency Department (HOSPITAL_COMMUNITY): Payer: Medicare Other

## 2018-10-29 ENCOUNTER — Encounter (HOSPITAL_COMMUNITY): Payer: Self-pay

## 2018-10-29 ENCOUNTER — Inpatient Hospital Stay (HOSPITAL_COMMUNITY)
Admission: EM | Admit: 2018-10-29 | Discharge: 2018-11-04 | DRG: 871 | Disposition: A | Discharge status: Home Health | Payer: Medicare Other | Source: Skilled Nursing Facility | Attending: Internal Medicine | Admitting: Internal Medicine

## 2018-10-29 ENCOUNTER — Inpatient Hospital Stay (HOSPITAL_COMMUNITY): Payer: Medicare Other

## 2018-10-29 DIAGNOSIS — L0390 Cellulitis, unspecified: Secondary | ICD-10-CM

## 2018-10-29 DIAGNOSIS — L97829 Non-pressure chronic ulcer of other part of left lower leg with unspecified severity: Secondary | ICD-10-CM | POA: Diagnosis present

## 2018-10-29 DIAGNOSIS — F329 Major depressive disorder, single episode, unspecified: Secondary | ICD-10-CM | POA: Diagnosis present

## 2018-10-29 DIAGNOSIS — F419 Anxiety disorder, unspecified: Secondary | ICD-10-CM | POA: Diagnosis present

## 2018-10-29 DIAGNOSIS — I110 Hypertensive heart disease with heart failure: Secondary | ICD-10-CM | POA: Diagnosis present

## 2018-10-29 DIAGNOSIS — L03818 Cellulitis of other sites: Secondary | ICD-10-CM

## 2018-10-29 DIAGNOSIS — R509 Fever, unspecified: Secondary | ICD-10-CM

## 2018-10-29 DIAGNOSIS — R402242 Coma scale, best verbal response, confused conversation, at arrival to emergency department: Secondary | ICD-10-CM | POA: Diagnosis present

## 2018-10-29 DIAGNOSIS — R402142 Coma scale, eyes open, spontaneous, at arrival to emergency department: Secondary | ICD-10-CM | POA: Diagnosis present

## 2018-10-29 DIAGNOSIS — R6520 Severe sepsis without septic shock: Secondary | ICD-10-CM | POA: Diagnosis not present

## 2018-10-29 DIAGNOSIS — L03115 Cellulitis of right lower limb: Secondary | ICD-10-CM | POA: Diagnosis present

## 2018-10-29 DIAGNOSIS — E785 Hyperlipidemia, unspecified: Secondary | ICD-10-CM | POA: Diagnosis present

## 2018-10-29 DIAGNOSIS — Z8719 Personal history of other diseases of the digestive system: Secondary | ICD-10-CM

## 2018-10-29 DIAGNOSIS — Z66 Do not resuscitate: Secondary | ICD-10-CM | POA: Diagnosis present

## 2018-10-29 DIAGNOSIS — A400 Sepsis due to streptococcus, group A: Principal | ICD-10-CM | POA: Diagnosis present

## 2018-10-29 DIAGNOSIS — L03116 Cellulitis of left lower limb: Secondary | ICD-10-CM | POA: Diagnosis present

## 2018-10-29 DIAGNOSIS — R0600 Dyspnea, unspecified: Secondary | ICD-10-CM

## 2018-10-29 DIAGNOSIS — Z880 Allergy status to penicillin: Secondary | ICD-10-CM

## 2018-10-29 DIAGNOSIS — A419 Sepsis, unspecified organism: Secondary | ICD-10-CM | POA: Diagnosis not present

## 2018-10-29 DIAGNOSIS — Z79890 Hormone replacement therapy (postmenopausal): Secondary | ICD-10-CM

## 2018-10-29 DIAGNOSIS — I509 Heart failure, unspecified: Secondary | ICD-10-CM | POA: Diagnosis present

## 2018-10-29 DIAGNOSIS — I10 Essential (primary) hypertension: Secondary | ICD-10-CM | POA: Diagnosis not present

## 2018-10-29 DIAGNOSIS — E039 Hypothyroidism, unspecified: Secondary | ICD-10-CM | POA: Diagnosis present

## 2018-10-29 DIAGNOSIS — G9341 Metabolic encephalopathy: Secondary | ICD-10-CM | POA: Diagnosis not present

## 2018-10-29 DIAGNOSIS — I83028 Varicose veins of left lower extremity with ulcer other part of lower leg: Secondary | ICD-10-CM | POA: Diagnosis present

## 2018-10-29 DIAGNOSIS — L03119 Cellulitis of unspecified part of limb: Secondary | ICD-10-CM | POA: Diagnosis not present

## 2018-10-29 DIAGNOSIS — Z7901 Long term (current) use of anticoagulants: Secondary | ICD-10-CM | POA: Diagnosis not present

## 2018-10-29 DIAGNOSIS — I83029 Varicose veins of left lower extremity with ulcer of unspecified site: Secondary | ICD-10-CM | POA: Diagnosis not present

## 2018-10-29 DIAGNOSIS — G8929 Other chronic pain: Secondary | ICD-10-CM | POA: Diagnosis present

## 2018-10-29 DIAGNOSIS — H9190 Unspecified hearing loss, unspecified ear: Secondary | ICD-10-CM | POA: Diagnosis present

## 2018-10-29 DIAGNOSIS — Z823 Family history of stroke: Secondary | ICD-10-CM | POA: Diagnosis not present

## 2018-10-29 DIAGNOSIS — E559 Vitamin D deficiency, unspecified: Secondary | ICD-10-CM | POA: Diagnosis present

## 2018-10-29 DIAGNOSIS — Z8249 Family history of ischemic heart disease and other diseases of the circulatory system: Secondary | ICD-10-CM | POA: Diagnosis not present

## 2018-10-29 DIAGNOSIS — G92 Toxic encephalopathy: Secondary | ICD-10-CM | POA: Diagnosis present

## 2018-10-29 DIAGNOSIS — L97929 Non-pressure chronic ulcer of unspecified part of left lower leg with unspecified severity: Secondary | ICD-10-CM

## 2018-10-29 DIAGNOSIS — J449 Chronic obstructive pulmonary disease, unspecified: Secondary | ICD-10-CM | POA: Diagnosis present

## 2018-10-29 DIAGNOSIS — Z8619 Personal history of other infectious and parasitic diseases: Secondary | ICD-10-CM

## 2018-10-29 DIAGNOSIS — I872 Venous insufficiency (chronic) (peripheral): Secondary | ICD-10-CM | POA: Diagnosis present

## 2018-10-29 DIAGNOSIS — I878 Other specified disorders of veins: Secondary | ICD-10-CM | POA: Diagnosis present

## 2018-10-29 DIAGNOSIS — I482 Chronic atrial fibrillation, unspecified: Secondary | ICD-10-CM | POA: Diagnosis present

## 2018-10-29 DIAGNOSIS — Z961 Presence of intraocular lens: Secondary | ICD-10-CM | POA: Diagnosis present

## 2018-10-29 DIAGNOSIS — Z9841 Cataract extraction status, right eye: Secondary | ICD-10-CM

## 2018-10-29 DIAGNOSIS — Z9842 Cataract extraction status, left eye: Secondary | ICD-10-CM

## 2018-10-29 DIAGNOSIS — F0390 Unspecified dementia without behavioral disturbance: Secondary | ICD-10-CM | POA: Diagnosis present

## 2018-10-29 DIAGNOSIS — M1612 Unilateral primary osteoarthritis, left hip: Secondary | ICD-10-CM | POA: Diagnosis present

## 2018-10-29 DIAGNOSIS — G9340 Encephalopathy, unspecified: Secondary | ICD-10-CM | POA: Diagnosis not present

## 2018-10-29 DIAGNOSIS — R402352 Coma scale, best motor response, localizes pain, at arrival to emergency department: Secondary | ICD-10-CM | POA: Diagnosis present

## 2018-10-29 DIAGNOSIS — Z79899 Other long term (current) drug therapy: Secondary | ICD-10-CM | POA: Diagnosis not present

## 2018-10-29 DIAGNOSIS — Z79891 Long term (current) use of opiate analgesic: Secondary | ICD-10-CM

## 2018-10-29 DIAGNOSIS — I4891 Unspecified atrial fibrillation: Secondary | ICD-10-CM | POA: Diagnosis present

## 2018-10-29 DIAGNOSIS — I739 Peripheral vascular disease, unspecified: Secondary | ICD-10-CM | POA: Diagnosis present

## 2018-10-29 LAB — URINALYSIS, ROUTINE W REFLEX MICROSCOPIC
Bilirubin Urine: NEGATIVE
GLUCOSE, UA: NEGATIVE mg/dL
Hgb urine dipstick: NEGATIVE
KETONES UR: NEGATIVE mg/dL
LEUKOCYTES UA: NEGATIVE
NITRITE: NEGATIVE
PH: 5 (ref 5.0–8.0)
PROTEIN: NEGATIVE mg/dL
Specific Gravity, Urine: 1.015 (ref 1.005–1.030)

## 2018-10-29 LAB — COMPREHENSIVE METABOLIC PANEL
ALBUMIN: 3.4 g/dL — AB (ref 3.5–5.0)
ALT: 57 U/L — ABNORMAL HIGH (ref 0–44)
ANION GAP: 9 (ref 5–15)
AST: 89 U/L — ABNORMAL HIGH (ref 15–41)
Alkaline Phosphatase: 141 U/L — ABNORMAL HIGH (ref 38–126)
BUN: 23 mg/dL (ref 8–23)
CHLORIDE: 104 mmol/L (ref 98–111)
CO2: 23 mmol/L (ref 22–32)
Calcium: 8.8 mg/dL — ABNORMAL LOW (ref 8.9–10.3)
Creatinine, Ser: 0.93 mg/dL (ref 0.44–1.00)
GFR calc Af Amer: 59 mL/min — ABNORMAL LOW (ref >60)
GFR calc non Af Amer: 51 mL/min — ABNORMAL LOW (ref >60)
GLUCOSE: 110 mg/dL — AB (ref 70–99)
POTASSIUM: 5 mmol/L (ref 3.5–5.1)
SODIUM: 136 mmol/L (ref 135–145)
Total Bilirubin: 1 mg/dL (ref 0.3–1.2)
Total Protein: 7.4 g/dL (ref 6.5–8.1)

## 2018-10-29 LAB — CBC WITH DIFFERENTIAL/PLATELET
ABS IMMATURE GRANULOCYTES: 0.17 10*3/uL — AB (ref 0.00–0.07)
BASOS ABS: 0.1 10*3/uL (ref 0.0–0.1)
Basophils Relative: 0 %
Eosinophils Absolute: 0 10*3/uL (ref 0.0–0.5)
Eosinophils Relative: 0 %
HCT: 39.3 % (ref 36.0–46.0)
Hemoglobin: 12.6 g/dL (ref 12.0–15.0)
IMMATURE GRANULOCYTES: 1 %
LYMPHS ABS: 0.5 10*3/uL — AB (ref 0.7–4.0)
Lymphocytes Relative: 2 %
MCH: 31.7 pg (ref 26.0–34.0)
MCHC: 32.1 g/dL (ref 30.0–36.0)
MCV: 98.7 fL (ref 80.0–100.0)
MONOS PCT: 4 %
Monocytes Absolute: 0.9 10*3/uL (ref 0.1–1.0)
NEUTROS ABS: 18.5 10*3/uL — AB (ref 1.7–7.7)
NEUTROS PCT: 93 %
NRBC: 0 % (ref 0.0–0.2)
Platelets: 215 10*3/uL (ref 150–400)
RBC: 3.98 MIL/uL (ref 3.87–5.11)
RDW: 14.9 % (ref 11.5–15.5)
WBC: 20.1 10*3/uL — ABNORMAL HIGH (ref 4.0–10.5)

## 2018-10-29 LAB — PROTIME-INR
INR: 2.33
Prothrombin Time: 25.2 seconds — ABNORMAL HIGH (ref 11.4–15.2)

## 2018-10-29 LAB — INFLUENZA PANEL BY PCR (TYPE A & B)
Influenza A By PCR: NEGATIVE
Influenza B By PCR: NEGATIVE

## 2018-10-29 LAB — I-STAT CG4 LACTIC ACID, ED
LACTIC ACID, VENOUS: 2.2 mmol/L — AB (ref 0.5–1.9)
Lactic Acid, Venous: 1.81 mmol/L (ref 0.5–1.9)

## 2018-10-29 MED ORDER — SERTRALINE HCL 50 MG PO TABS
75.0000 mg | ORAL_TABLET | Freq: Every day | ORAL | Status: DC
  Administered 2018-10-30 – 2018-11-04 (×6): 75 mg via ORAL
  Filled 2018-10-29: qty 2
  Filled 2018-10-29: qty 1
  Filled 2018-10-29: qty 2
  Filled 2018-10-29 (×3): qty 1

## 2018-10-29 MED ORDER — SODIUM CHLORIDE 0.9 % IV BOLUS (SEPSIS)
1000.0000 mL | Freq: Once | INTRAVENOUS | Status: AC
  Administered 2018-10-29: 1000 mL via INTRAVENOUS

## 2018-10-29 MED ORDER — METRONIDAZOLE IN NACL 5-0.79 MG/ML-% IV SOLN
500.0000 mg | Freq: Three times a day (TID) | INTRAVENOUS | Status: DC
  Administered 2018-10-29: 500 mg via INTRAVENOUS
  Filled 2018-10-29: qty 100

## 2018-10-29 MED ORDER — LEVOFLOXACIN IN D5W 750 MG/150ML IV SOLN
750.0000 mg | INTRAVENOUS | Status: DC
  Administered 2018-10-29: 750 mg via INTRAVENOUS
  Filled 2018-10-29: qty 150

## 2018-10-29 MED ORDER — LEVOTHYROXINE SODIUM 100 MCG PO TABS
100.0000 ug | ORAL_TABLET | Freq: Every day | ORAL | Status: DC
  Administered 2018-10-30 – 2018-11-03 (×5): 100 ug via ORAL
  Filled 2018-10-29 (×5): qty 1

## 2018-10-29 MED ORDER — VANCOMYCIN HCL IN DEXTROSE 1-5 GM/200ML-% IV SOLN
1000.0000 mg | Freq: Once | INTRAVENOUS | Status: DC
  Administered 2018-10-29: 1000 mg via INTRAVENOUS
  Filled 2018-10-29: qty 200

## 2018-10-29 MED ORDER — SODIUM CHLORIDE 0.9 % IV BOLUS
500.0000 mL | Freq: Once | INTRAVENOUS | Status: AC
  Administered 2018-10-29: 500 mL via INTRAVENOUS

## 2018-10-29 MED ORDER — SODIUM CHLORIDE 0.9 % IV SOLN
INTRAVENOUS | Status: DC
  Administered 2018-10-29: via INTRAVENOUS

## 2018-10-29 MED ORDER — SODIUM CHLORIDE 0.9 % IV BOLUS (SEPSIS)
500.0000 mL | Freq: Once | INTRAVENOUS | Status: AC
  Administered 2018-10-29: 500 mL via INTRAVENOUS

## 2018-10-29 MED ORDER — SODIUM CHLORIDE 0.9 % IV BOLUS (SEPSIS)
250.0000 mL | Freq: Once | INTRAVENOUS | Status: AC
  Administered 2018-10-29: 250 mL via INTRAVENOUS

## 2018-10-29 MED ORDER — ENOXAPARIN SODIUM 40 MG/0.4ML ~~LOC~~ SOLN
40.0000 mg | Freq: Every day | SUBCUTANEOUS | Status: DC
  Filled 2018-10-29: qty 0.4

## 2018-10-29 MED ORDER — VANCOMYCIN HCL IN DEXTROSE 750-5 MG/150ML-% IV SOLN: 750.0000 mg | INTRAVENOUS | Status: DC

## 2018-10-29 MED ORDER — WARFARIN SODIUM 2 MG PO TABS: 2.0000 mg | ORAL_TABLET | Freq: Every day | ORAL | Status: DC

## 2018-10-29 MED ORDER — ACETAMINOPHEN 650 MG RE SUPP
650.0000 mg | Freq: Once | RECTAL | Status: AC
  Administered 2018-10-29: 650 mg via RECTAL
  Filled 2018-10-29: qty 1

## 2018-10-29 MED ORDER — WARFARIN - PHYSICIAN DOSING INPATIENT: Freq: Every day | Status: DC

## 2018-10-29 MED ORDER — SODIUM CHLORIDE 0.9 % IV SOLN: 2.0000 g | Freq: Once | INTRAVENOUS | Status: DC

## 2018-10-29 MED ORDER — CARVEDILOL 6.25 MG PO TABS
6.2500 mg | ORAL_TABLET | Freq: Every day | ORAL | Status: DC
  Administered 2018-10-30 – 2018-11-04 (×6): 6.25 mg via ORAL
  Filled 2018-10-29 (×6): qty 1

## 2018-10-29 MED ORDER — SODIUM CHLORIDE 0.9 % IV SOLN
1000.0000 mL | INTRAVENOUS | Status: DC
  Administered 2018-10-29: 1000 mL via INTRAVENOUS

## 2018-10-29 MED ORDER — SODIUM CHLORIDE 0.9 % IV SOLN
2.0000 g | Freq: Once | INTRAVENOUS | Status: AC
  Administered 2018-10-29: 2 g via INTRAVENOUS
  Filled 2018-10-29: qty 2

## 2018-10-29 MED ORDER — LORAZEPAM 2 MG/ML IJ SOLN
0.5000 mg | Freq: Once | INTRAMUSCULAR | Status: AC
  Administered 2018-10-29: 0.5 mg via INTRAVENOUS
  Filled 2018-10-29: qty 1

## 2018-10-29 NOTE — ED Notes (Signed)
Attempt made to call report, no answer.

## 2018-10-29 NOTE — ED Notes (Signed)
MD AND EN NOTIFIED PATIENT'S LACTIC ACID LEVEL OF 2.20

## 2018-10-29 NOTE — Progress Notes (Signed)
Pharmacy Antibiotic Note  [facility] is a 84 y.o. female admitted on 10/29/2018 with cellulitis/wound.  Pharmacy has been consulted for vancomycin and levofloxacin dosing.  Pt has PCN allergy listed on chart. She had orders for aztreonam 2 g and vancomycin 1000 mg in ED.  Today, 10/29/18  WBC 20.1, elevated  SCr 0.9, CrCl ~32 mL/min  Tmas 102.1  Lactate 1.81  Plan:  Vancomycin 1000 mg IV once followed by 750 mg IV q36h for estimated AUC of 480  Goal AUC 400-500  Levofloxacin 750 mg IV q48h  Pt also on metronidazole 500 mg IV q8h  Monitor renal function closely  Monitor clinical course, culture data. Monitor vancomycin levels at steady state as indicated.  Height: 5\' 4"  (162.6 cm) Weight: 120 lb (54.4 kg) IBW/kg (Calculated) : 54.7  Temp (24hrs), Avg:102.1 F (38.9 C), Min:102.1 F (38.9 C), Max:102.1 F (38.9 C)  Recent Labs  Lab 10/29/18 1823 10/29/18 1908 10/29/18 2134  WBC 20.1*  --   --   CREATININE 0.93  --   --   LATICACIDVEN  --  2.20* 1.81    Estimated Creatinine Clearance: 31.8 mL/min (by C-G formula based on SCr of 0.93 mg/dL).    Allergies  Allergen Reactions  . Sulfa Antibiotics Rash    Rash w/ sulfonomides furosemide, meloxicam  . Codeine     unknown  . Penicillins     unknown  . Sulfonylureas     unknown  . Clindamycin/Lincomycin Itching and Rash    Antimicrobials this admission: vancomycin 11/15 >>  levofloxacin 11/15 >>  Metronidazole 11/15 >>  Dose adjustments this admission:  Microbiology results: 11/15 BCx: Sent 11/15 UCx: Sent   Thank you for allowing pharmacy to be a part of this patient's care.  12/15, PharmD Clinical Pharmacist 10/29/2018 9:55 PM

## 2018-10-29 NOTE — ED Triage Notes (Signed)
Per EMS: Pt with decreased LOC x1 day from [facility].  Pt being tx for wound on R leg. Pt alert and verbal, A&Ox1.  Pt normally A&Ox4.

## 2018-10-29 NOTE — H&P (Addendum)
History and Physical    Brittany Schultz YQI:[id number] DOB: 04-Jan-1922 DOA: 10/29/2018  PCP: Angela Cox, MD  Patient coming from: snf   Chief Complaint: altered mental status  HPI: Brittany Schultz is a 84 y.o. female with medical history significant for dementia, chronic venous stasis with chronic lower extremity ulcers, atrial fibrillation on anticoagulation, htn, who presents with above.  History is quite limited. At time of my history taking patient had received ativan and was sleeping. Arousable but not conversant. Attempted to call family contact (granddaughter) but no answer. Per ED report, patient's baseline is alert and oriented, but over the past day or so has become much less oriented and lethargic. Today with fever as well. Nursing reports that bandages on lower extremities had a significant amount of serous drainage. No report of focal symptoms such as cough or abdominal pain or nausea/vomiting diarrhea.   Of note, patient was admitted in 2015 for cellulitis 2/2 chronic LE ulcers.  Home meds not available to review; nursing says pharmacy likely took the med lists.  ED Course: code sepsis, 1.75 L NS, aztreonam/flagyl/vanc, labs  Review of Systems: As per HPI otherwise 10 point review of systems negative.    Past Medical History:  Diagnosis Date  . A-fib [facility])    hospitalized 04/2009  . CHF (congestive heart failure) (HCC)   . Deaf   . Dermatitis 01/2014  . History of shingles   . Hx of colonic polyps    Adenomatous polyps  . Hyperlipidemia   . Hypertension   . Lack of coordination   . Long term (current) use of anticoagulants    warfarin for stroke risk reduction re: AF  . Muscle weakness (generalized)   . Open wound of left lower leg 02/2104  . Osteoarthritis of left hip   . Other malaise and fatigue   . Peripheral vascular disease, unspecified (HCC)   . PVD (peripheral vascular disease) (HCC)   . Shortness of breath 03/23/14  . Thrombocytopenia (HCC)  April 2015   144,000  . Unspecified hypothyroidism   . Unstable gait   . Varicose veins of lower extremities with ulcer (HCC)   . Venous insufficiency of both lower extremities    vascular evaluation 10/2013, 01/2014 Dr.Powell  . Venous stasis ulcer of left lower extremity (HCC)   . Vitamin D deficiency     Past Surgical History:  Procedure Laterality Date  . CATARACT EXTRACTION W/ INTRAOCULAR LENS  IMPLANT, BILATERAL Bilateral      reports that she has never smoked. She has never used smokeless tobacco. She reports that she does not drink alcohol or use drugs.  Allergies  Allergen Reactions  . Sulfa Antibiotics Rash    Rash w/ sulfonomides furosemide, meloxicam  . Codeine     unknown  . Penicillins     unknown  . Sulfonylureas     unknown  . Clindamycin/Lincomycin Itching and Rash    Family History  Problem Relation Age of Onset  . Hypertension Mother   . Stroke Mother   . Heart disease Mother   . Hypertension Father   . Hypertension Brother     Prior to Admission medications   Medication Sig Start Date End Date Taking? Authorizing Provider  acetaminophen (TYLENOL) 325 MG tablet Take 650 mg by mouth 3 (three) times daily.  03/17/14  Yes Krell, Claudette T, NP  carvedilol (COREG) 6.25 MG tablet Take 6.25 mg by mouth daily.    Yes [provider]  diclofenac  sodium (VOLTAREN) 1 % GEL Apply 2 g topically 3 (three) times daily. Both lower extremities 06/25/18  Yes [provider]  furosemide (LASIX) 40 MG tablet Take 1 tablet (40 mg total) by mouth daily. 11/02/14  Yes Leroy Sea, MD  hydrALAZINE (APRESOLINE) 25 MG tablet Take 1 tablet (25 mg total) by mouth every 8 (eight) hours. Patient taking differently: Take 25 mg by mouth 2 (two) times daily.  11/02/14  Yes Leroy Sea, MD  levothyroxine (SYNTHROID, LEVOTHROID) 100 MCG tablet Take 100 mcg by mouth at bedtime.    Yes [provider]  Nutritional Supplements (JUICE PLUS FIBRE PO)  Take 1 capsule by mouth 3 (three) times daily. Take one capsule of each three times a day [employer]   Yes [provider]  potassium chloride SA (K-DUR,KLOR-CON) 20 MEQ tablet Take 20 mEq by mouth daily. 07/01/18  Yes [provider]  rivaroxaban (XARELTO) 20 MG TABS tablet Take 20 mg by mouth daily.    Yes [provider]  sertraline (ZOLOFT) 50 MG tablet Take 75 mg by mouth daily.  10/07/18  Yes [provider]  spironolactone (ALDACTONE) 25 MG tablet Take 1 tablet (25 mg total) by mouth daily. 04/20/14  Yes Krell, Claudette T, NP  traMADol (ULTRAM) 50 MG tablet Take 50 mg by mouth as directed. Take 1 tablet BID & Take 1 tablet daily PRN for Pain. 10/26/18  Yes [provider]  vitamin B-12 (CYANOCOBALAMIN) 1000 MCG tablet Take 1,000 mcg by mouth daily.   Yes [provider]  VITAMIN D, ERGOCALCIFEROL, PO Take 1,000 Units by mouth daily.    Yes [provider]  Zinc Oxide (DESITIN) 13 % CREA Apply 1 application topically 2 (two) times daily.   Yes [provider]  doxycycline (VIBRAMYCIN) 100 MG capsule Take 1 capsule (100 mg total) by mouth 2 (two) times daily. Patient not taking: Reported on 10/29/2018 07/23/18   Benjiman Core, MD  hydrOXYzine (ATARAX/VISTARIL) 25 MG tablet Take 1 tablet (25 mg total) by mouth every 8 (eight) hours as needed. Patient not taking: Reported on 07/23/2018 04/20/14   Maxwell Marion T, NP  mineral oil external liquid Place 2 drops into both ears once a week.    [provider]  warfarin (COUMADIN) 2 MG tablet Take 1 tablet (2 mg total) by mouth daily. INR checked in 2 days Patient not taking: Reported on 07/23/2018 11/02/14   Leroy Sea, MD    Physical Exam: Vitals:   10/29/18 1825 10/29/18 1915 10/29/18 1930 10/29/18 2030  BP: (!) 147/77 (!) 117/56 (!) 108/57 (!) 121/54  Pulse: 91 90 86   Resp: 16 18 20 16   Temp: (!) 102.1 F (38.9 C)       SpO2: 95% 93% 94% 94%  Weight:      Height:        Constitutional: No acute distress. Sleeping but arosable to touch and noice Head: Atraumatic Eyes: Conjunctiva clear ENM: Moist mucous membranes. Poor dentition.  Neck: Supple Respiratory: decreased inspiratory effort. Rales at bases, otherwise clear Cardiovascular: irregularly irregular, distant heart sounds Abdomen: soft, non-tender, no masses Musculoskeletal: decreased muscle tone Skin: hyperpigmentation b/l lower extremities. Ulcers bilaterally, right greater than left. On right large ulcer posterior and lateral calf. Smaller ulcer left LLE superior to medial malleolus. Skin is quite warm Extremities: 1+ le edema Neurologic: somnolent but arousable, moving all 4 extremities.  Psychiatric: not alert, not oriented   Labs on  Admission: I have personally reviewed following labs and imaging studies  CBC: Recent Labs  Lab 10/29/18 1823  WBC 20.1*  NEUTROABS 18.5*  HGB 12.6  HCT 39.3  MCV 98.7  PLT 215   Basic Metabolic Panel: Recent Labs  Lab 10/29/18 1823  NA 136  K 5.0  CL 104  CO2 23  GLUCOSE 110*  BUN 23  CREATININE 0.93  CALCIUM 8.8*   GFR: Estimated Creatinine Clearance: 31.8 mL/min (by C-G formula based on SCr of 0.93 mg/dL). Liver Function Tests: Recent Labs  Lab 10/29/18 1823  AST 89*  ALT 57*  ALKPHOS 141*  BILITOT 1.0  PROT 7.4  ALBUMIN 3.4*   No results for input(s): LIPASE, AMYLASE in the last 168 hours. No results for input(s): AMMONIA in the last 168 hours. Coagulation Profile: Recent Labs  Lab 10/29/18 1823  INR 2.33   Cardiac Enzymes: No results for input(s): CKTOTAL, CKMB, CKMBINDEX, TROPONINI in the last 168 hours. BNP (last 3 results) No results for input(s): PROBNP in the last 8760 hours. HbA1C: No results for input(s): HGBA1C in the last 72 hours. CBG: No results for input(s): GLUCAP in the last 168 hours. Lipid Profile: No results for input(s): CHOL, HDL, LDLCALC,  TRIG, CHOLHDL, LDLDIRECT in the last 72 hours. Thyroid Function Tests: No results for input(s): TSH, T4TOTAL, FREET4, T3FREE, THYROIDAB in the last 72 hours. Anemia Panel: No results for input(s): VITAMINB12, FOLATE, FERRITIN, TIBC, IRON, RETICCTPCT in the last 72 hours. Urine analysis:    Component Value Date/Time   COLORURINE YELLOW 10/29/2018 1823   APPEARANCEUR CLEAR 10/29/2018 1823   LABSPEC 1.015 10/29/2018 1823   PHURINE 5.0 10/29/2018 1823   GLUCOSEU NEGATIVE 10/29/2018 1823   HGBUR NEGATIVE 10/29/2018 1823   BILIRUBINUR NEGATIVE 10/29/2018 1823   KETONESUR NEGATIVE 10/29/2018 1823   PROTEINUR NEGATIVE 10/29/2018 1823   UROBILINOGEN 0.2 10/30/2014 2242   NITRITE NEGATIVE 10/29/2018 1823   LEUKOCYTESUR NEGATIVE 10/29/2018 1823    Radiological Exams on Admission: Dg Chest Port 1 View  Result Date: 10/29/2018 CLINICAL DATA:  Dyspnea EXAM: PORTABLE CHEST 1 VIEW COMPARISON:  07/01/2016 CXR FINDINGS: Emphysematous hyperinflation of the lungs with stable cardiomegaly and aortic atherosclerosis. Scarring and atelectasis at the right lung base. Blunting of the right costophrenic angle may reflect a small effusion or pleural thickening. Osteoarthritis of the glenohumeral joint on the right with inferomedial spurring off the humeral head. Mild AC joint osteoarthritis is also noted on the right. IMPRESSION: COPD with aortic atherosclerosis. Electronically Signed   By: Tollie Eth M.D.   On: 10/29/2018 18:47    EKG: Independently reviewed. A-fib, borderline LAD.  Assessment/Plan Principal Problem:   Cellulitis Active Problems:   Essential hypertension, benign   A-fib (HCC)   Hypothyroidism   Venous stasis ulcer of left lower extremity (HCC)   Long term current use of anticoagulant therapy   Sepsis (HCC)   Sepsis due to cellulitis (HCC)   # Lower extremity cellulitis # Sepsis - hx of chronic ulcers of lower extremities, with what appears to be increased drainage as of late, with  recent mental status change, and here with fever and leukocytosis and hypotension and elevated lactate. CXR unremarkable, urinalysis not suggestive of infection. Unable to take full history from patient but no reported history to suggest intraabdominal pathology. S/p 1.75 L in ED as well as broad spectrum antibiotics. Hypotensive with MAP of about 65 during my exam; had just received a benzodiazepine. - step-down - abx: vancomycin/levo/flagyl given severity of symptoms (  pcn allergy) - DNR paperwork accompanies patient - have ordered an additional 500 ml NS bolus, will start maintenance fluids at 150, care to wean given patient's age and small body habitus - f/u blood cultures - wound consult - checking repeat lactate - given duration of ulcers checking x-rays of lower extremities to eval for signs osteomyelitis  # Transaminitis - mild, likely 2/2 hypotension - repeat cmp in AM, further w/u if persistent  # atrial fibrillation, chronic - here normal rate.  - cont home carvedilol - home med list in computer lists both warfarin and xarelto. Actual med list from her facility is not at bedside and family not available. INR is in therapeutic level (2.33). Have opted to continue warfarin and hold xarelto, but will need to clarify home anticoagulation  # hypertension - cont carvedilol as above; hold home lsaix and hydralazine and spironolactone given hypotension  # chronic pain - hold home tramadol given delirium and hypotension  # delirium - likely 2/2 acute infection  # hypothyroid  - cont home levothyroxine  # anx/dep - cont home sertraline  DVT prophylaxis: lovenox Code Status: dnr, signed order accompanies her  Family Communication: none yet, but attempted Disposition Plan: tbd  Consults called: none  Admission status: step-down    Silvano Bilis MD [facility] Pager [phone]  If 7PM-7AM, please contact night-coverage [email or web address] Password [email or web address]  10/29/2018, 9:25  PM

## 2018-10-29 NOTE — Progress Notes (Signed)
A consult was received from an ED physician for vancomycin and aztreonam per pharmacy dosing.  The patient's profile has been reviewed for ht/wt/allergies/indication/available labs.    A one time order has been placed for vancomycin 1000 mg and aztreonam 2 g IV once..  Further antibiotics/pharmacy consults should be ordered by admitting physician if indicated.                       Thank you, Cindi Carbon, PharmD 10/29/2018  7:44 PM

## 2018-10-29 NOTE — ED Notes (Signed)
Bed: EX51 Expected date:  Expected time:  Means of arrival:  Comments: [facility] 84y/o (wound)

## 2018-10-29 NOTE — ED Notes (Signed)
Patient has stage 3 wound on lower left extremity measuring 3.5*2.9*0.3 cm Scattered Stage 3 wounds on lower right extremity 15 * 15 * 0.4 cm that are circumferential. Wounds cleaned and dressed with Wet to dry on lower left extremity and Xeroform and kerlix on lower right

## 2018-10-29 NOTE — ED Provider Notes (Signed)
[facility]-EMERGENCY DEPT Provider Note   CSN: [id number] Arrival date & time: 10/29/18  1755     History   Chief Complaint No chief complaint on file.   HPI Brittany Schultz is a 84 y.o. female.  84 year old email from nursing home presents with altered mental status x24 hours.  Patient was found to be febrile.  Is currently being treated for bilateral lower extremity wounds.  No treatment given prior to arrival and patient transported here.     Past Medical History:  Diagnosis Date  . A-fib [facility])    hospitalized 04/2009  . CHF (congestive heart failure) (HCC)   . Deaf   . Dermatitis 01/2014  . History of shingles   . Hx of colonic polyps    Adenomatous polyps  . Hyperlipidemia   . Hypertension   . Lack of coordination   . Long term (current) use of anticoagulants    warfarin for stroke risk reduction re: AF  . Muscle weakness (generalized)   . Open wound of left lower leg 02/2104  . Osteoarthritis of left hip   . Other malaise and fatigue   . Peripheral vascular disease, unspecified (HCC)   . PVD (peripheral vascular disease) (HCC)   . Shortness of breath 03/23/14  . Thrombocytopenia (HCC) April 2015   144,000  . Unspecified hypothyroidism   . Unstable gait   . Varicose veins of lower extremities with ulcer (HCC)   . Venous insufficiency of both lower extremities    vascular evaluation 10/2013, 01/2014 Dr.Powell  . Venous stasis ulcer of left lower extremity (HCC)   . Vitamin D deficiency     Patient Active Problem List   Diagnosis Date Noted  . Upper airway cough syndrome 07/01/2016  . Pleural effusion on right 07/01/2016  . Acute CHF (HCC) 10/31/2014  . Acute encephalopathy 10/31/2014  . Atrial fibrillation (HCC) 10/31/2014  . Subacute confusional state 10/30/2014  . Cellulitis 10/30/2014  . 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 Lymphedema 04/20/2014  . Debility 03/30/2014  . Shortness  of breath 03/23/2014  . Deaf   . Varicose veins of lower extremities with ulcer (HCC)   . Peripheral vascular disease (HCC)   . Muscle weakness (generalized)   . Other malaise and fatigue   . Lack of coordination   . Unstable gait   . Hyperlipidemia   . Venous insufficiency of both lower extremities   . Open wound of left lower leg   . Hypertension   . Osteoarthritis of left hip   . Long term current use of anticoagulant therapy   . Thrombocytopenia (HCC) 03/15/2014  . Cellulitis and abscess of leg, except foot 02/25/2014  . Generalized weakness 02/21/2014  . Unspecified constipation 02/21/2014  . Dermatitis 02/21/2014  . Unspecified hypothyroidism 02/21/2014  . Essential hypertension, benign 02/19/2014  . Congestive heart failure (HCC) 02/19/2014  . A-fib (HCC) 02/19/2014  . Macular rash 02/19/2014  . Osteoarthritis 02/19/2014  . Hypothyroidism 02/19/2014  . Venous stasis ulcer of left lower extremity (HCC) 02/19/2014    Past Surgical History:  Procedure Laterality Date  . CATARACT EXTRACTION W/ INTRAOCULAR LENS  IMPLANT, BILATERAL Bilateral      OB History   None      Home Medications    Prior to Admission medications   Medication Sig Start Date End Date Taking? Authorizing Provider  acetaminophen (TYLENOL) 325 MG tablet Take 650 mg by mouth 3 (three) times daily.  03/17/14   Krell, Jani Gravel T, NP  carvedilol (COREG) 6.25 MG tablet Take 6.25 mg by mouth 2 (two) times daily with a meal.    [provider]  diclofenac sodium (VOLTAREN) 1 % GEL Apply 2 g topically 3 (three) times daily. Both lower extremities 06/25/18   [provider]  doxycycline (VIBRAMYCIN) 100 MG capsule Take 1 capsule (100 mg total) by mouth 2 (two) times daily. 07/23/18   Benjiman Core, MD  furosemide (LASIX) 40 MG tablet Take 1 tablet (40 mg total) by mouth daily. 11/02/14   Leroy Sea, MD  hydrALAZINE (APRESOLINE) 25 MG tablet Take 1 tablet (25 mg total) by mouth every  8 (eight) hours. Patient taking differently: Take 25 mg by mouth 2 (two) times daily.  11/02/14   Leroy Sea, MD  hydrOXYzine (ATARAX/VISTARIL) 25 MG tablet Take 1 tablet (25 mg total) by mouth every 8 (eight) hours as needed. Patient not taking: Reported on 07/23/2018 04/20/14   Maxwell Marion T, NP  levothyroxine (SYNTHROID, LEVOTHROID) 100 MCG tablet Take 100 mcg by mouth at bedtime.     [provider]  mineral oil external liquid Place 2 drops into both ears once a week.    [provider]  mupirocin ointment (BACTROBAN) 2 % Apply 1 application topically daily. Cleanse right lower leg with normal saline, pat dry, apply Bactroban ointment (Mupirocin) to wound ber, cover with non 07/14/18   [provider]  Nutritional Supplements (JUICE PLUS FIBRE PO) Take 1 capsule by mouth See admin instructions. Take one capsule of each three times a day [employer], Historical, MD  potassium chloride SA (K-DUR,KLOR-CON) 20 MEQ tablet Take 20 mEq by mouth daily. 07/01/18   [provider]  rivaroxaban (XARELTO) 20 MG TABS tablet Take 20 mg by mouth daily with supper.    [provider]  Skin Protectants, Misc. (EUCERIN) cream Apply 1 application topically 2 (two) times daily.    [provider]  spironolactone (ALDACTONE) 25 MG tablet Take 1 tablet (25 mg total) by mouth daily. 04/20/14   Maxwell Marion T, NP  vitamin B-12 (CYANOCOBALAMIN) 1000 MCG tablet Take 1,000 mcg by mouth daily.    [provider]  VITAMIN D, ERGOCALCIFEROL, PO Take 1,000 Units by mouth every 30 (thirty) days.     [provider]  warfarin (COUMADIN) 2 MG tablet Take 1 tablet (2 mg total) by mouth daily. INR checked in 2 days Patient not taking: Reported on 07/23/2018 11/02/14   Leroy Sea, MD    Family History Family History  Problem Relation Age of Onset  . Hypertension Mother   . Stroke Mother   . Heart  disease Mother   . Hypertension Father   . Hypertension Brother     Social History Social History   Tobacco Use  . Smoking status: Never Smoker  . Smokeless tobacco: Never Used  Substance Use Topics  . Alcohol use: No  . Drug use: No     Allergies   Sulfa antibiotics; Codeine; Penicillins; Sulfonylureas; and Clindamycin/lincomycin   Review of Systems Review of Systems  Unable to perform ROS: Dementia     Physical Exam Updated Vital Signs SpO2 97%   Physical Exam  Constitutional: She appears well-developed and well-nourished. She appears lethargic.  Non-toxic appearance. No distress.  HENT:  Head: Normocephalic and atraumatic.  Eyes: Pupils are equal, round, and reactive to light. Conjunctivae, EOM and lids are normal.  Neck: Normal range of motion. Neck supple. No tracheal deviation present. No thyroid mass present.  Cardiovascular: Normal rate, regular rhythm and normal heart sounds. Exam reveals no gallop.  No murmur heard. Pulmonary/Chest: Effort normal and breath sounds normal. No stridor. No respiratory distress. She has no decreased breath sounds. She has no wheezes. She has no rhonchi. She has no rales.  Abdominal: Soft. Normal appearance and bowel sounds are normal. She exhibits no distension. There is no tenderness. There is no rigidity, no rebound, no guarding and no CVA tenderness.  Musculoskeletal: Normal range of motion. She exhibits no edema or tenderness.       Legs: Neurological: She appears lethargic. She displays atrophy. No cranial nerve deficit. GCS eye subscore is 4. GCS verbal subscore is 4. GCS motor subscore is 5.  Patient withdraws to pain in all 4 extremities.  Skin: Skin is warm and dry. No abrasion and no rash noted.  Psychiatric: Her affect is blunt. Her speech is delayed. She is slowed.  Nursing note and vitals reviewed.    ED Treatments / Results  Labs (all labs ordered are listed, but only abnormal results are displayed) Labs  Reviewed  CULTURE, BLOOD (ROUTINE X 2)  CULTURE, BLOOD (ROUTINE X 2)  URINE CULTURE  COMPREHENSIVE METABOLIC PANEL  CBC WITH DIFFERENTIAL/PLATELET  URINALYSIS, ROUTINE W REFLEX MICROSCOPIC  PROTIME-INR  I-STAT CG4 LACTIC ACID, ED    EKG None  Radiology No results found.  Procedures Procedures (including critical care time)  Medications Ordered in ED Medications  0.9 %  sodium chloride infusion (has no administration in time range)     Initial Impression / Assessment and Plan / ED Course  I have reviewed the triage vital signs and the nursing notes.  Pertinent labs & imaging results that were available during my care of the patient were reviewed by me and considered in my medical decision making (see chart for details).     Patient's fever treated with Tylenol.  No obvious source of infection at this time.  Patient given fluid bolus as well and started on empiric antibiotics.  Abdominal exam repeated and she is not tender to palpation.  Discussed with hospitalist and patient will be admitted  Final Clinical Impressions(s) / ED Diagnoses   Final diagnoses:  None    ED Discharge Orders    None       Lorre Nick, MD 10/29/18 2017

## 2018-10-29 NOTE — ED Notes (Signed)
Attempted to call Grandson to let him know patient room number, no answer

## 2018-10-29 NOTE — ED Notes (Signed)
Previous shift unable to obtain second blood culture, patient stuck multiple times

## 2018-10-30 ENCOUNTER — Other Ambulatory Visit: Payer: Self-pay

## 2018-10-30 DIAGNOSIS — E039 Hypothyroidism, unspecified: Secondary | ICD-10-CM

## 2018-10-30 DIAGNOSIS — Z7901 Long term (current) use of anticoagulants: Secondary | ICD-10-CM

## 2018-10-30 DIAGNOSIS — I10 Essential (primary) hypertension: Secondary | ICD-10-CM

## 2018-10-30 DIAGNOSIS — L97929 Non-pressure chronic ulcer of unspecified part of left lower leg with unspecified severity: Secondary | ICD-10-CM

## 2018-10-30 DIAGNOSIS — R509 Fever, unspecified: Secondary | ICD-10-CM

## 2018-10-30 DIAGNOSIS — I83029 Varicose veins of left lower extremity with ulcer of unspecified site: Secondary | ICD-10-CM

## 2018-10-30 DIAGNOSIS — L0390 Cellulitis, unspecified: Secondary | ICD-10-CM

## 2018-10-30 LAB — BLOOD CULTURE ID PANEL (REFLEXED)
ACINETOBACTER BAUMANNII: NOT DETECTED
CANDIDA ALBICANS: NOT DETECTED
CANDIDA GLABRATA: NOT DETECTED
CANDIDA KRUSEI: NOT DETECTED
Candida parapsilosis: NOT DETECTED
Candida tropicalis: NOT DETECTED
ENTEROBACTER CLOACAE COMPLEX: NOT DETECTED
ENTEROBACTERIACEAE SPECIES: NOT DETECTED
ENTEROCOCCUS SPECIES: NOT DETECTED
ESCHERICHIA COLI: NOT DETECTED
Haemophilus influenzae: NOT DETECTED
Klebsiella oxytoca: NOT DETECTED
Klebsiella pneumoniae: NOT DETECTED
LISTERIA MONOCYTOGENES: NOT DETECTED
Neisseria meningitidis: NOT DETECTED
PROTEUS SPECIES: NOT DETECTED
PSEUDOMONAS AERUGINOSA: NOT DETECTED
STREPTOCOCCUS PNEUMONIAE: NOT DETECTED
Serratia marcescens: NOT DETECTED
Staphylococcus aureus (BCID): NOT DETECTED
Staphylococcus species: NOT DETECTED
Streptococcus agalactiae: NOT DETECTED
Streptococcus pyogenes: DETECTED — AB
Streptococcus species: DETECTED — AB

## 2018-10-30 LAB — COMPREHENSIVE METABOLIC PANEL
ALT: 48 U/L — AB (ref 0–44)
AST: 64 U/L — ABNORMAL HIGH (ref 15–41)
Albumin: 2.7 g/dL — ABNORMAL LOW (ref 3.5–5.0)
Alkaline Phosphatase: 107 U/L (ref 38–126)
Anion gap: 12 (ref 5–15)
BUN: 24 mg/dL — ABNORMAL HIGH (ref 8–23)
CHLORIDE: 111 mmol/L (ref 98–111)
CO2: 17 mmol/L — AB (ref 22–32)
CREATININE: 0.76 mg/dL (ref 0.44–1.00)
Calcium: 7.8 mg/dL — ABNORMAL LOW (ref 8.9–10.3)
GFR calc non Af Amer: >60 mL/min (ref >60)
Glucose, Bld: 114 mg/dL — ABNORMAL HIGH (ref 70–99)
Potassium: 4.1 mmol/L (ref 3.5–5.1)
SODIUM: 140 mmol/L (ref 135–145)
Total Bilirubin: 1.4 mg/dL — ABNORMAL HIGH (ref 0.3–1.2)
Total Protein: 5.9 g/dL — ABNORMAL LOW (ref 6.5–8.1)

## 2018-10-30 LAB — CBC
HCT: 33.8 % — ABNORMAL LOW (ref 36.0–46.0)
Hemoglobin: 10.4 g/dL — ABNORMAL LOW (ref 12.0–15.0)
MCH: 31.1 pg (ref 26.0–34.0)
MCHC: 30.8 g/dL (ref 30.0–36.0)
MCV: 101.2 fL — ABNORMAL HIGH (ref 80.0–100.0)
PLATELETS: 191 10*3/uL (ref 150–400)
RBC: 3.34 MIL/uL — AB (ref 3.87–5.11)
RDW: 15.1 % (ref 11.5–15.5)
WBC: 23.4 10*3/uL — ABNORMAL HIGH (ref 4.0–10.5)
nRBC: 0 % (ref 0.0–0.2)

## 2018-10-30 LAB — PROTIME-INR
INR: 2.52
Prothrombin Time: 26.8 seconds — ABNORMAL HIGH (ref 11.4–15.2)

## 2018-10-30 LAB — MRSA PCR SCREENING: MRSA BY PCR: NEGATIVE

## 2018-10-30 MED ORDER — CEFAZOLIN SODIUM-DEXTROSE 2-4 GM/100ML-% IV SOLN
2.0000 g | Freq: Three times a day (TID) | INTRAVENOUS | Status: DC
  Administered 2018-10-30 – 2018-10-31 (×2): 2 g via INTRAVENOUS
  Filled 2018-10-30 (×3): qty 100

## 2018-10-30 MED ORDER — SODIUM CHLORIDE 0.9 % IV SOLN
1.0000 g | Freq: Two times a day (BID) | INTRAVENOUS | Status: DC
  Filled 2018-10-30: qty 1

## 2018-10-30 MED ORDER — SODIUM CHLORIDE 0.9 % IV SOLN
1000.0000 mL | INTRAVENOUS | Status: DC
  Administered 2018-10-30 – 2018-11-03 (×6): 1000 mL via INTRAVENOUS

## 2018-10-30 MED ORDER — TRAMADOL HCL 50 MG PO TABS
50.0000 mg | ORAL_TABLET | Freq: Two times a day (BID) | ORAL | Status: DC | PRN
  Administered 2018-10-30 – 2018-11-03 (×4): 50 mg via ORAL
  Filled 2018-10-30 (×4): qty 1

## 2018-10-30 MED ORDER — RIVAROXABAN 15 MG PO TABS
15.0000 mg | ORAL_TABLET | Freq: Every day | ORAL | Status: DC
  Administered 2018-10-30 – 2018-11-03 (×5): 15 mg via ORAL
  Filled 2018-10-30 (×5): qty 1

## 2018-10-30 MED ORDER — DIPHENHYDRAMINE HCL 25 MG PO CAPS
25.0000 mg | ORAL_CAPSULE | ORAL | Status: DC | PRN
  Administered 2018-10-31 – 2018-11-01 (×2): 25 mg via ORAL
  Filled 2018-10-30 (×3): qty 1

## 2018-10-30 MED ORDER — CLINDAMYCIN PHOSPHATE 600 MG/50ML IV SOLN
600.0000 mg | Freq: Three times a day (TID) | INTRAVENOUS | Status: DC
  Administered 2018-10-30 – 2018-10-31 (×3): 600 mg via INTRAVENOUS
  Filled 2018-10-30 (×3): qty 50

## 2018-10-30 NOTE — Progress Notes (Signed)
PHARMACY - PHYSICIAN COMMUNICATION CRITICAL VALUE ALERT - BLOOD CULTURE IDENTIFICATION (BCID)  Brittany Schultz is an 84 y.o. female who presented to [facility] on 10/29/2018 with a chief complaint of altered mental status, lethargy and fever.  Assessment:  Lower extremity ulcers (include suspected source if known)  Name of physician (or Provider) Contacted: Pokhrel  Current antibiotics: vancomycin, cefepime  Changes to prescribed antibiotics recommended:  Recommendations accepted by provider - Cefazolin 2g IV q8h, Clindamycin 600mg  IV q8h (diphenhydramine ordered for rash)  Results for orders placed or performed during the hospital encounter of 10/29/18  Blood Culture ID Panel (Reflexed) (Collected: 10/29/2018  6:23 PM)  Result Value Ref Range   Enterococcus species NOT DETECTED NOT DETECTED   Listeria monocytogenes NOT DETECTED NOT DETECTED   Staphylococcus species NOT DETECTED NOT DETECTED   Staphylococcus aureus (BCID) NOT DETECTED NOT DETECTED   Streptococcus species DETECTED (A) NOT DETECTED   Streptococcus agalactiae NOT DETECTED NOT DETECTED   Streptococcus pneumoniae NOT DETECTED NOT DETECTED   Streptococcus pyogenes DETECTED (A) NOT DETECTED   Acinetobacter baumannii NOT DETECTED NOT DETECTED   Enterobacteriaceae species NOT DETECTED NOT DETECTED   Enterobacter cloacae complex NOT DETECTED NOT DETECTED   Escherichia coli NOT DETECTED NOT DETECTED   Klebsiella oxytoca NOT DETECTED NOT DETECTED   Klebsiella pneumoniae NOT DETECTED NOT DETECTED   Proteus species NOT DETECTED NOT DETECTED   Serratia marcescens NOT DETECTED NOT DETECTED   Haemophilus influenzae NOT DETECTED NOT DETECTED   Neisseria meningitidis NOT DETECTED NOT DETECTED   Pseudomonas aeruginosa NOT DETECTED NOT DETECTED   Candida albicans NOT DETECTED NOT DETECTED   Candida glabrata NOT DETECTED NOT DETECTED   Candida krusei NOT DETECTED NOT DETECTED   Candida parapsilosis NOT DETECTED NOT DETECTED   Candida tropicalis NOT DETECTED NOT DETECTED    10/31/2018, PharmD, BCPS Pager: [phone] 10/30/2018  2:51 PM

## 2018-10-30 NOTE — Progress Notes (Addendum)
PROGRESS NOTE  Brittany Schultz XLK:[id number] DOB: 03-31-1922 DOA: 10/29/2018 PCP: Angela Cox, MD   LOS: 1 day   Brief narrative: Brittany Schultz is a 84 y.o. female with medical history significant for dementia, chronic venous stasis with chronic lower extremity ulcers, atrial fibrillation on anticoagulation and hypertension who presented to the hospital with altered mental status, lethargy and one day of fever. Nursing staff reported that bandages on lower extremities had a significant amount of serous drainage. Of note, patient was admitted in 2015 for cellulitis 2/2 chronic LE ulcers. In the ED, code sepsis was called in.  Patient did have elevated lactate of 2.2.  Patient received 1.7 L of normal saline, Azactam, Flagyl vancomycin and the patient was admitted to the hospital for further evaluation and treatment.   Assessment/Plan:  Principal Problem:   Cellulitis Active Problems:   Essential hypertension, benign   A-fib (HCC)   Hypothyroidism   Venous stasis ulcer of left lower extremity (HCC)   Long term current use of anticoagulant therapy   Sepsis (HCC)   Sepsis due to cellulitis (HCC)  Sepsis secondary to  lower extremity cellulitis.  T-max of 102.1 F.  Leukocytosis of 23,000.  Lactic acid was elevated at 2.2.  Initially received antibiotics with Levaquin vancomycin.  Blood cultures positive for Streptococcus species.  Will change antibiotic to cefazolin and clindamycin.  Spoke with pharmacy.  Flu was negative.  Repeat lactate was 1.8 after IV fluids.  Urinalysis was negative for infection.  X-ray of the ankle tibia and fibula did not show any acute bony abnormality.  Chest x-ray was suggestive of findings of COPD.  Wound care has been consulted.  Continue wound care while in the hospital.  Altered mental status likely secondary to metabolic encephalopathy from infection.  Improving.  Mild elevated LFTs secondary to hypotension.  Trend CMP in am.  History of chronic  atrial fibrillation.  On Coreg.   INR was 2.3 on presentation.  Patient was on Xarelto will continue with that. Discontinue warfarin from med list.  Anxiety, depression.  Continue sertraline.  Hypertension.  Continue Coreg with holding parameters.  Monitor blood pressure.  History of chronic pain.  Continue tramadol.  VTE Prophylaxis: Xarelto  Code Status: DNR  Family Communication: Unable to reach both numbers provided in the computer despite multiple attempts.  Disposition Plan: 2 to 3 days to SNF.  Consultants:  None  Procedures:  None  Antibiotics:  Cefazolin and clinda > 11/02/18  Vancomycin and Levaquin >10/29/17  Subjective: Patient was more alert awake communicative to me today.  Denies overt pain.  Denies any shortness of breath, cough or fever.  Objective: Vitals:   10/30/18 0700 10/30/18 0809  BP: (!) 108/46   Pulse: 75   Resp: 20   Temp:  98.3 F (36.8 C)  SpO2: 95%     Intake/Output Summary (Last 24 hours) at 10/30/2018 0947 Last data filed at 10/30/2018 0200 Gross per 24 hour  Intake 581.41 ml  Output -  Net 581.41 ml   Filed Weights   10/29/18 1811  Weight: 54.4 kg   Physical Examination: General exam: Appears calm and comfortable ,Not in distress, thinly built HEENT:PERRL,Oral mucosa moist Respiratory system: Bilateral equal air entry, normal vesicular breath sounds, no wheezes or crackles  Cardiovascular system: S1 & S2 heard, irregularly irregular rhythm. Gastrointestinal system: Abdomen is nondistended, soft and nontender. No organomegaly or masses felt. Normal bowel sounds heard. Central nervous system: Alert awake and communicative today.  Moves  all extremities. Extremities: Bilateral lower extremity hyperpigmentation, bilateral lower extremity ulceration right greater than the left.  Currently wrapped with bandages. Skin: Bilateral lower extremity ulceration. MSK: Normal muscle bulk,tone ,power  Data Review: I have personally  reviewed the following laboratory data and studies,  CBC: Recent Labs  Lab 10/29/18 1823 10/30/18 0308  WBC 20.1* 23.4*  NEUTROABS 18.5*  --   HGB 12.6 10.4*  HCT 39.3 33.8*  MCV 98.7 101.2*  PLT 215 191   Basic Metabolic Panel: Recent Labs  Lab 10/29/18 1823 10/30/18 0308  NA 136 140  K 5.0 4.1  CL 104 111  CO2 23 17*  GLUCOSE 110* 114*  BUN 23 24*  CREATININE 0.93 0.76  CALCIUM 8.8* 7.8*   Liver Function Tests: Recent Labs  Lab 10/29/18 1823 10/30/18 0308  AST 89* 64*  ALT 57* 48*  ALKPHOS 141* 107  BILITOT 1.0 1.4*  PROT 7.4 5.9*  ALBUMIN 3.4* 2.7*   No results for input(s): LIPASE, AMYLASE in the last 168 hours. No results for input(s): AMMONIA in the last 168 hours. Cardiac Enzymes: No results for input(s): CKTOTAL, CKMB, CKMBINDEX, TROPONINI in the last 168 hours. BNP (last 3 results) No results for input(s): BNP in the last 8760 hours.  ProBNP (last 3 results) No results for input(s): PROBNP in the last 8760 hours.  CBG: No results for input(s): GLUCAP in the last 168 hours. Recent Results (from the past 240 hour(s))  Blood Culture (routine x 2)     Status: None (Preliminary result)   Collection Time: 10/29/18  6:23 PM  Result Value Ref Range Status   Specimen Description   Final    BLOOD RIGHT HAND Performed at [facility], [address], [place], [place] [address]    Special Requests   Final    BOTTLES DRAWN AEROBIC AND ANAEROBIC Blood Culture adequate volume Performed at [facility], [address] [place], [place] [address]    Culture   Final    NO GROWTH < 12 HOURS Performed at [facility], [address] [place], [place] [address]    Report Status PENDING  Incomplete  MRSA PCR Screening     Status: None   Collection Time: 10/29/18 10:58 PM  Result Value Ref Range Status   MRSA by PCR NEGATIVE NEGATIVE Final    Comment:        The GeneXpert MRSA Assay (FDA approved for NASAL  specimens only), is one component of a comprehensive MRSA colonization surveillance program. It is not intended to diagnose MRSA infection nor to guide or monitor treatment for MRSA infections. Performed at [facility], [address], [place], [place] [address]      Studies: Dg Tibia/fibula Left  Result Date: 10/29/2018 CLINICAL DATA:  Assess cellulitis.  Sepsis. EXAM: LEFT TIBIA AND FIBULA - 2 VIEW COMPARISON:  None. FINDINGS: No osseous erosions are seen to suggest osteomyelitis. The tibia and fibula appear grossly intact. Minimal degenerative change is noted at the patellofemoral compartment. The visualized portions of the left knee are grossly unremarkable. The ankle mortise is not well characterized. Mild degenerative change is suggested at the tibiotalar articulation. Scattered vascular calcifications are seen. IMPRESSION: 1. No osseous erosions seen to suggest osteomyelitis. Tibia and fibula appear grossly intact. 2. Scattered vascular calcifications seen. Electronically Signed   By: Roanna Raider M.D.   On: 10/29/2018 22:39   Dg Tibia/fibula Right  Result Date: 10/29/2018 CLINICAL DATA:  Assess cellulitis.  Sepsis. EXAM: RIGHT TIBIA AND FIBULA - 2 VIEW COMPARISON:  None. FINDINGS: No osseous erosions are seen to suggest osteomyelitis. Degenerative change is noted at the patellofemoral compartment, with joint space narrowing. An enthesophyte is noted at the upper pole of the patella. The tibia and fibula appear grossly intact. The ankle mortise is incompletely assessed, but appears grossly unremarkable. Soft tissue swelling is noted about the medial aspect of the knee and proximal lower leg. Scattered vascular calcifications are seen. IMPRESSION: 1. No osseous erosions seen to suggest osteomyelitis. 2. Scattered vascular calcifications seen. 3. Degenerative change at the patellofemoral compartment. Electronically Signed   By: Roanna Raider M.D.   On: 10/29/2018  22:41   Dg Ankle 2 Views Left  Result Date: 10/29/2018 CLINICAL DATA:  Assess cellulitis.  Sepsis. EXAM: LEFT ANKLE - 2 VIEW COMPARISON:  None. FINDINGS: There is no evidence of fracture or dislocation. No osseous erosions are definitely characterized to suggest osteomyelitis. However, there is diffuse osteopenia of visualized osseous structures, limiting evaluation. Soft tissue swelling is noted about the lower leg and ankle, with a dressing overlying the medial aspect of the ankle. Diffuse vascular calcifications are seen. Mild degenerative change is noted at the talar dome. A plantar calcaneal spur is seen. An os peroneum is noted. IMPRESSION: 1. No evidence of fracture or dislocation. No definite evidence for osteomyelitis. However, diffuse osteopenia of visualized osseous structures limits evaluation for erosions. 2. Diffuse vascular calcifications seen. 3. Soft tissue swelling about the lower leg and ankle. Electronically Signed   By: Roanna Raider M.D.   On: 10/29/2018 22:43   Dg Ankle 2 Views Right  Result Date: 10/29/2018 CLINICAL DATA:  Assess cellulitis.  Sepsis. EXAM: RIGHT ANKLE - 2 VIEW COMPARISON:  None. FINDINGS: There is no evidence of fracture or dislocation. No osseous erosions are seen to suggest osteomyelitis. The ankle mortise is grossly unremarkable in appearance. Mild degenerative change is noted about the midfoot. A small plantar calcaneal spur is seen. An os peroneum is noted. Scattered vascular calcifications are noted. No significant soft tissue swelling is characterized on radiograph. IMPRESSION: 1. No osseous erosions seen to suggest osteomyelitis. No evidence of fracture or dislocation. 2. Scattered vascular calcifications seen. Electronically Signed   By: Roanna Raider M.D.   On: 10/29/2018 22:42   Dg Chest Port 1 View  Result Date: 10/29/2018 CLINICAL DATA:  Dyspnea EXAM: PORTABLE CHEST 1 VIEW COMPARISON:  07/01/2016 CXR FINDINGS: Emphysematous hyperinflation of the  lungs with stable cardiomegaly and aortic atherosclerosis. Scarring and atelectasis at the right lung base. Blunting of the right costophrenic angle may reflect a small effusion or pleural thickening. Osteoarthritis of the glenohumeral joint on the right with inferomedial spurring off the humeral head. Mild AC joint osteoarthritis is also noted on the right. IMPRESSION: COPD with aortic atherosclerosis. Electronically Signed   By: Tollie Eth M.D.   On: 10/29/2018 18:47    Scheduled Meds: . carvedilol  6.25 mg Oral Daily  . enoxaparin (LOVENOX) injection  40 mg Subcutaneous Daily  . levothyroxine  100 mcg Oral QHS  . sertraline  75 mg Oral Daily  . warfarin  2 mg Oral q1800  . warfarin  2 mg Oral q1800  . Warfarin - Physician Dosing Inpatient   Does not apply q1800   Continuous Infusions: . sodium chloride 125 mL/hr at 10/30/18 0600  . sodium chloride Stopped (10/29/18 2356)  . levofloxacin (LEVAQUIN) IV Stopped (10/30/18 0126)  . [START ON 10/31/2018] vancomycin  Time spent: 25 minutes. More than 50% of that time was spent in counseling and/or coordination of care.  Braulio Kiedrowski  [facility] Pager [phone]  If 7PM-7AM, please contact night-coverage at [email or web address], password [facility] 10/30/2018, 9:47 AM

## 2018-10-30 NOTE — Progress Notes (Signed)
Pharmacy Antibiotic Note  Brittany Schultz is a 84 y.o. female admitted on 10/29/2018 with cellulitis/wound.  Pharmacy has been consulted for vancomycin and levofloxacin dosing.  Today, 10/30/2018  Pt has PCN allergy listed on chart. Discussed with MD, since cross-reactivity with cephalosporins is <5%, will change to cefepime from levaquin due to high incidence of adverse effects in elderly patients.   WBC 23.4, elevated  SCr 0.76, CrCl ~36 mL/min  Tmax 102.1, now afebrile  Lactate 2.2 > 1.81  Plan:  Begin Cefepime 1g IV q12h  Continue Vancomycin as previously ordered - 750 mg IV q36h for estimated AUC of 480  Goal AUC 400-500  Monitor clinical course, culture data. Monitor vancomycin levels at steady state as indicated.  Height: 5\' 4"  (162.6 cm) Weight: 120 lb (54.4 kg) IBW/kg (Calculated) : 54.7  Temp (24hrs), Avg:99.3 F (37.4 C), Min:98.1 F (36.7 C), Max:102.1 F (38.9 C)  Recent Labs  Lab 10/29/18 1823 10/29/18 1908 10/29/18 2134 10/30/18 0308  WBC 20.1*  --   --  23.4*  CREATININE 0.93  --   --  0.76  LATICACIDVEN  --  2.20* 1.81  --     Estimated Creatinine Clearance: 36.9 mL/min (by C-G formula based on SCr of 0.76 mg/dL).    Allergies  Allergen Reactions  . Sulfa Antibiotics Rash    Rash w/ sulfonomides furosemide, meloxicam  . Codeine     unknown  . Penicillins     unknown  . Sulfonylureas     unknown  . Clindamycin/Lincomycin Itching and Rash    Antimicrobials this admission:  11/15 Aztreonam x 1 11/15 Levaquin x 1 11/15 Flagyl x 1 11/15 Vanc >> 11/15 Cefepime >>  Dose adjustments this admission:   Microbiology results:  11/15 Influenza A/B: neg/neg 11/15 MRSA PCR: neg 11/15 UCx: 11/16 BCx:  Thank you for allowing pharmacy to be a part of this patient's care.  12/16, PharmD, BCPS Pager: [phone] Clinical Pharmacist 10/30/2018 10:49 AM

## 2018-10-31 DIAGNOSIS — G9341 Metabolic encephalopathy: Secondary | ICD-10-CM

## 2018-10-31 LAB — CBC
HCT: 36.8 % (ref 36.0–46.0)
HEMOGLOBIN: 11.7 g/dL — AB (ref 12.0–15.0)
MCH: 31.4 pg (ref 26.0–34.0)
MCHC: 31.8 g/dL (ref 30.0–36.0)
MCV: 98.7 fL (ref 80.0–100.0)
Platelets: 197 10*3/uL (ref 150–400)
RBC: 3.73 MIL/uL — AB (ref 3.87–5.11)
RDW: 15.8 % — ABNORMAL HIGH (ref 11.5–15.5)
WBC: 13.7 10*3/uL — AB (ref 4.0–10.5)
nRBC: 0 % (ref 0.0–0.2)

## 2018-10-31 LAB — COMPREHENSIVE METABOLIC PANEL
ALT: 34 U/L (ref 0–44)
AST: 37 U/L (ref 15–41)
Albumin: 2.9 g/dL — ABNORMAL LOW (ref 3.5–5.0)
Alkaline Phosphatase: 98 U/L (ref 38–126)
Anion gap: 10 (ref 5–15)
BUN: 26 mg/dL — ABNORMAL HIGH (ref 8–23)
CHLORIDE: 112 mmol/L — AB (ref 98–111)
CO2: 18 mmol/L — AB (ref 22–32)
Calcium: 7.9 mg/dL — ABNORMAL LOW (ref 8.9–10.3)
Creatinine, Ser: 0.9 mg/dL (ref 0.44–1.00)
GFR calc non Af Amer: 53 mL/min — ABNORMAL LOW (ref >60)
Glucose, Bld: 74 mg/dL (ref 70–99)
POTASSIUM: 4 mmol/L (ref 3.5–5.1)
SODIUM: 140 mmol/L (ref 135–145)
Total Bilirubin: 0.9 mg/dL (ref 0.3–1.2)
Total Protein: 6.6 g/dL (ref 6.5–8.1)

## 2018-10-31 LAB — URINE CULTURE: Culture: NO GROWTH

## 2018-10-31 LAB — MAGNESIUM: MAGNESIUM: 1.7 mg/dL (ref 1.7–2.4)

## 2018-10-31 MED ORDER — LIP MEDEX EX OINT
TOPICAL_OINTMENT | CUTANEOUS | Status: DC | PRN
  Administered 2018-10-31: 1 via TOPICAL
  Filled 2018-10-31: qty 7

## 2018-10-31 MED ORDER — DIPHENHYDRAMINE HCL 50 MG/ML IJ SOLN: 25.0000 mg | Freq: Once | INTRAMUSCULAR | Status: DC

## 2018-10-31 MED ORDER — CEFAZOLIN SODIUM-DEXTROSE 2-4 GM/100ML-% IV SOLN
2.0000 g | Freq: Three times a day (TID) | INTRAVENOUS | Status: DC
  Administered 2018-10-31 – 2018-11-01 (×5): 2 g via INTRAVENOUS
  Filled 2018-10-31 (×8): qty 100

## 2018-10-31 MED ORDER — ALBUTEROL SULFATE (2.5 MG/3ML) 0.083% IN NEBU: 2.5000 mg | INHALATION_SOLUTION | Freq: Four times a day (QID) | RESPIRATORY_TRACT | Status: DC | PRN

## 2018-10-31 NOTE — Progress Notes (Signed)
Pt transferred to 5 E rm #[id number]. VS stable.

## 2018-10-31 NOTE — Progress Notes (Signed)
PROGRESS NOTE  [name] FSF:[id number] DOB: [birth year] DOA: 10/29/2018 PCP: Angela Cox, MD  HPI/Brief Narrative  Brittany Schultz is a 84 y.o. year old female with medical history significant for AF on anticoagulation, chronic venous stasis lower extremity ulcers, HTN,  who presented on 10/29/2018 with reports from her facility [facility]) with one day of of increased lethargy, less oriented (reportedly normallly alert and oriented x 4), worsening drainage from chronic lower extremity ulcers and fever and was found to have severe sepsis presumed secondary to cellulitis of chronic LE ulcers.  ED course: Patient was found to have fever of 102.1, with blood pressure 104/50 requiring fluid resuscitation with 1.7 L normal saline, blood cultures were obtained, lactic acid was 2.2.  Flu panel was negative CMP was unremarkable.  Initial white count was 20.1.  UA was unremarkable.  INR was 2.33  X-ray of left fibula with no acute abnormalities, x-ray of right fibula with no evidence of osteomyelitis X-rays of right and left ankles show no evidence of fractures Chest x-ray shows COPD otherwise no acute changes. Empirically started on aztreonam, Flagyl, vancomycin  Hospital Course:  Subjective Very agitated and confused during today's exam.  Denies any complaints of pain.  Would like to go back to sleep.  Assessment/Plan:  #Sepsis secondary to S. Pyogens bacteremia, suspected source lower leg cellulitis. Hemodynamically stable and sepsis physiology resolved. Remains afebrile on deescalated antibiotic regimen of cefazolin and clindamycin. Awaiting further sensitivities. Lactic acidosis resolved and BP much better, will d/c IVF, transfer to floor and continue to monitor. Anticipate needing 7-10 days for bacteremia coverage  #Acute toxic encephalopathy. Increased confusion and lethargy likely resultant of sepsis physiology from infection above. Still agitated this am. Will try again  to touch base with family. Delirium precautions  #HTN, at goal. BP range in last 24 hours 104-110/52-53. Much better, no current need to resume home lasix and spirinolactone, will monitor. Continue  home coreg. Holding home lasix and spironolactone due to initial hypotension on admission  #Afib, stable. Home coreg and xarelto  #Depression,stable continue home zoloft  #Hypothyroidism, stable. Home synthroid   Code Status: DNR   Family Communication: will call   Disposition Plan: transfer to floor, awaiting culture sensitivities    Consultants:  none     Procedures:  none   Antimicrobials:  Cefazolin and clinda > 11/02/18  Vancomycin, Flagyl, and Levaquin >10/29/17  Cultures:  Wound culture (right and left leg)- neg for strep or staph, 11/18  Blood cultures 1 of 2, strep pyogens, 11/15  Telemetry:yes  DVT prophylaxis:  warfarin   Objective: Vitals:   10/30/18 2308 10/31/18 0016 10/31/18 0323 10/31/18 0400  BP:  (!) 104/51  (!) 110/52  Pulse:  80  76  Resp:  18  19  Temp: 97.7 F (36.5 C)  98 F (36.7 C)   TempSrc: Oral  Oral   SpO2:  95%  95%  Weight:      Height:        Intake/Output Summary (Last 24 hours) at 10/31/2018 0716 Last data filed at 10/31/2018 3343 Gross per 24 hour  Intake 3326.14 ml  Output 300 ml  Net 3026.14 ml   Filed Weights   10/29/18 1811  Weight: 54.4 kg    Exam:  Constitutional: Elderly female, lying in bed no acute distress Eyes: EOMI, anicteric, normal conjunctivae Cardiovascular: RRR no MRGs, with scant edema bilateral lower legs Respiratory: Normal respiratory effort, clear breath sounds  Abdomen: Soft,non-tender,  Skin:  Dressings in place bilateral lower legs with no obvious erythema or drainage Neurologic: Grossly no focal neuro deficit. Psychiatric: Very agitated, alert to self only  Data Reviewed: CBC: Recent Labs  Lab 10/29/18 1823 10/30/18 0308 10/31/18 0349  WBC 20.1* 23.4* 13.7*  NEUTROABS  18.5*  --   --   HGB 12.6 10.4* 11.7*  HCT 39.3 33.8* 36.8  MCV 98.7 101.2* 98.7  PLT 215 191 197   Basic Metabolic Panel: Recent Labs  Lab 10/29/18 1823 10/30/18 0308 10/31/18 0349  NA 136 140 140  K 5.0 4.1 4.0  CL 104 111 112*  CO2 23 17* 18*  GLUCOSE 110* 114* 74  BUN 23 24* 26*  CREATININE 0.93 0.76 0.90  CALCIUM 8.8* 7.8* 7.9*  MG  --   --  1.7   GFR: Estimated Creatinine Clearance: 32.8 mL/min (by C-G formula based on SCr of 0.9 mg/dL). Liver Function Tests: Recent Labs  Lab 10/29/18 1823 10/30/18 0308 10/31/18 0349  AST 89* 64* 37  ALT 57* 48* 34  ALKPHOS 141* 107 98  BILITOT 1.0 1.4* 0.9  PROT 7.4 5.9* 6.6  ALBUMIN 3.4* 2.7* 2.9*   No results for input(s): LIPASE, AMYLASE in the last 168 hours. No results for input(s): AMMONIA in the last 168 hours. Coagulation Profile: Recent Labs  Lab 10/29/18 1823 10/30/18 0308  INR 2.33 2.52   Cardiac Enzymes: No results for input(s): CKTOTAL, CKMB, CKMBINDEX, TROPONINI in the last 168 hours. BNP (last 3 results) No results for input(s): PROBNP in the last 8760 hours. HbA1C: No results for input(s): HGBA1C in the last 72 hours. CBG: No results for input(s): GLUCAP in the last 168 hours. Lipid Profile: No results for input(s): CHOL, HDL, LDLCALC, TRIG, CHOLHDL, LDLDIRECT in the last 72 hours. Thyroid Function Tests: No results for input(s): TSH, T4TOTAL, FREET4, T3FREE, THYROIDAB in the last 72 hours. Anemia Panel: No results for input(s): VITAMINB12, FOLATE, FERRITIN, TIBC, IRON, RETICCTPCT in the last 72 hours. Urine analysis:    Component Value Date/Time   COLORURINE YELLOW 10/29/2018 1823   APPEARANCEUR CLEAR 10/29/2018 1823   LABSPEC 1.015 10/29/2018 1823   PHURINE 5.0 10/29/2018 1823   GLUCOSEU NEGATIVE 10/29/2018 1823   HGBUR NEGATIVE 10/29/2018 1823   BILIRUBINUR NEGATIVE 10/29/2018 1823   KETONESUR NEGATIVE 10/29/2018 1823   PROTEINUR NEGATIVE 10/29/2018 1823   UROBILINOGEN 0.2 10/30/2014  2242   NITRITE NEGATIVE 10/29/2018 1823   LEUKOCYTESUR NEGATIVE 10/29/2018 1823   Sepsis Labs: @LABRCNTIP (procalcitonin:4,lacticidven:4)  ) Recent Results (from the past 240 hour(s))  Blood Culture (routine x 2)     Status: None (Preliminary result)   Collection Time: 10/29/18  6:23 PM  Result Value Ref Range Status   Specimen Description   Final    BLOOD RIGHT HAND Performed at [facility], [address], [place], [place]    Special Requests   Final    BOTTLES DRAWN AEROBIC AND ANAEROBIC Blood Culture adequate volume Performed at [facility], [address], [place], [place]    Culture  Setup Time   Final    GRAM POSITIVE COCCI IN CHAINS IN BOTH AEROBIC AND ANAEROBIC BOTTLES Organism ID to follow CRITICAL RESULT CALLED TO, READ BACK BY AND VERIFIED WITH: PHARM A PHAM [phone] BY GF    Culture   Final    NO GROWTH < 24 HOURS Performed at [facility], [address], [place], [place]    Report Status PENDING  Incomplete  Blood Culture ID Panel (Reflexed)     Status: Abnormal   Collection Time: 10/29/18  6:23 PM  Result Value Ref Range Status   Enterococcus species NOT DETECTED NOT DETECTED Final   Listeria monocytogenes NOT DETECTED NOT DETECTED Final   Staphylococcus species NOT DETECTED NOT DETECTED Final   Staphylococcus aureus (BCID) NOT DETECTED NOT DETECTED Final   Streptococcus species DETECTED (A) NOT DETECTED Final    Comment: CRITICAL RESULT CALLED TO, READ BACK BY AND VERIFIED WITH: PHARMD A PHAM [phone] BY GF    Streptococcus agalactiae NOT DETECTED NOT DETECTED Final   Streptococcus pneumoniae NOT DETECTED NOT DETECTED Final   Streptococcus pyogenes DETECTED (A) NOT DETECTED Final    Comment: CRITICAL RESULT CALLED TO, READ BACK BY AND VERIFIED WITH: PHARMD A PHAM [phone] BY GF    Acinetobacter baumannii NOT DETECTED NOT DETECTED Final   Enterobacteriaceae species NOT  DETECTED NOT DETECTED Final   Enterobacter cloacae complex NOT DETECTED NOT DETECTED Final   Escherichia coli NOT DETECTED NOT DETECTED Final   Klebsiella oxytoca NOT DETECTED NOT DETECTED Final   Klebsiella pneumoniae NOT DETECTED NOT DETECTED Final   Proteus species NOT DETECTED NOT DETECTED Final   Serratia marcescens NOT DETECTED NOT DETECTED Final   Haemophilus influenzae NOT DETECTED NOT DETECTED Final   Neisseria meningitidis NOT DETECTED NOT DETECTED Final   Pseudomonas aeruginosa NOT DETECTED NOT DETECTED Final   Candida albicans NOT DETECTED NOT DETECTED Final   Candida glabrata NOT DETECTED NOT DETECTED Final   Candida krusei NOT DETECTED NOT DETECTED Final   Candida parapsilosis NOT DETECTED NOT DETECTED Final   Candida tropicalis NOT DETECTED NOT DETECTED Final    Comment: Performed at [facility], [address], [place], [place] [address]  MRSA PCR Screening     Status: None   Collection Time: 10/29/18 10:58 PM  Result Value Ref Range Status   MRSA by PCR NEGATIVE NEGATIVE Final    Comment:        The GeneXpert MRSA Assay (FDA approved for NASAL specimens only), is one component of a comprehensive MRSA colonization surveillance program. It is not intended to diagnose MRSA infection nor to guide or monitor treatment for MRSA infections. Performed at [facility], [address], [place], [place] [address]   Blood Culture (routine x 2)     Status: None (Preliminary result)   Collection Time: 10/30/18  3:08 AM  Result Value Ref Range Status   Specimen Description   Final    BLOOD BLOOD RIGHT HAND Performed at [facility], [address] [place], [place] [address]    Special Requests   Final    BOTTLES DRAWN AEROBIC ONLY Blood Culture adequate volume Performed at [facility], [address], [place], [place] [address]    Culture   Final    NO GROWTH < 12 HOURS Performed at [facility], [address] [place], [place] [address]    Report Status PENDING  Incomplete      Studies: No results found.  Scheduled Meds: . carvedilol  6.25 mg Oral Daily  . diphenhydrAMINE  25 mg Intravenous Once  . levothyroxine  100 mcg Oral QHS  . rivaroxaban  15 mg Oral Q supper  . sertraline  75 mg Oral Daily    Continuous Infusions: . sodium chloride Stopped (10/30/18 1102)  . sodium chloride 1,000 mL (10/30/18 2139)  .  ceFAZolin (ANCEF)  IV    . clindamycin (CLEOCIN) IV 600 mg (10/31/18 9417)     LOS: 2 days     Laverna Peace, MD [facility] Pager [phone]  If 7PM-7AM, please contact night-coverage [email or web address] Password [email or web address] 10/31/2018, 7:16 AM

## 2018-10-31 NOTE — Progress Notes (Signed)
Entered pt's room to find pt complaining of her hands itching. Pt with obvious generalized rash noted as well. NP on call paged and PRN dose of benadryl given. Dose of ancef that was infusing stopped and primary fluids resumed. Will continue to monitor.

## 2018-11-01 LAB — CBC
HEMATOCRIT: 38.4 % (ref 36.0–46.0)
Hemoglobin: 12.1 g/dL (ref 12.0–15.0)
MCH: 31.4 pg (ref 26.0–34.0)
MCHC: 31.5 g/dL (ref 30.0–36.0)
MCV: 99.7 fL (ref 80.0–100.0)
Platelets: 212 10*3/uL (ref 150–400)
RBC: 3.85 MIL/uL — ABNORMAL LOW (ref 3.87–5.11)
RDW: 16 % — AB (ref 11.5–15.5)
WBC: 12 10*3/uL — AB (ref 4.0–10.5)
nRBC: 0 % (ref 0.0–0.2)

## 2018-11-01 LAB — CULTURE, BLOOD (ROUTINE X 2): Special Requests: ADEQUATE

## 2018-11-01 LAB — BASIC METABOLIC PANEL
Anion gap: 12 (ref 5–15)
BUN: 24 mg/dL — AB (ref 8–23)
CHLORIDE: 110 mmol/L (ref 98–111)
CO2: 17 mmol/L — ABNORMAL LOW (ref 22–32)
CREATININE: 0.87 mg/dL (ref 0.44–1.00)
Calcium: 8.2 mg/dL — ABNORMAL LOW (ref 8.9–10.3)
GFR calc Af Amer: >60 mL/min (ref >60)
GFR calc non Af Amer: 55 mL/min — ABNORMAL LOW (ref >60)
Glucose, Bld: 86 mg/dL (ref 70–99)
Potassium: 4 mmol/L (ref 3.5–5.1)
SODIUM: 139 mmol/L (ref 135–145)

## 2018-11-01 MED ORDER — CEPHALEXIN 500 MG PO CAPS
500.0000 mg | ORAL_CAPSULE | Freq: Four times a day (QID) | ORAL | Status: DC
  Administered 2018-11-02 – 2018-11-04 (×8): 500 mg via ORAL
  Filled 2018-11-01 (×8): qty 1

## 2018-11-01 NOTE — Consult Note (Signed)
WOC Nurse wound consult note Reason for Consult: bilateral full thickness LE wounds (R>L) Wound type: Venous insufficiency, infectious Pressure Injury POA: NA Measurement: RLE:  12cm x 17cm surface area with full thickness wounds scattered throughout, anterior LE and lateral LE.  Pink, moist wound bed.  Previous dressing was adhering, some minor bleeding upon removal despite moistening with NS to remove and letting soak. Serous to light yellow exudate in a moderate amount noted on old dressing LLE at medial aspect:  3cm x 7cm x 0.3cm with red wound bed and moderate amount of yellow exudate. Friable skin at periphery. Wound bed:As described above Drainage (amount, consistency, odor) As described above Periwound: intact, friable, no edema, warmth  Dressing procedure/placement/frequency: I will implement a POC that included twice daily wound care using NS to cleanse and follow with topical antimicrobial care using xeroform gauze. The feet will be placed into pressure redistribution heel boots for elevation and prevention of pressure injury.  WOC nursing team will not follow, but will remain available to this patient, the nursing and medical teams.  Please re-consult if needed. Thanks, Ladona Mow, MSN, RN, GNP, [facility] [place]  Pager# [phone]

## 2018-11-01 NOTE — Plan of Care (Signed)
  Problem: Elimination: Goal: Will not experience complications related to bowel motility Outcome: Progressing   Problem: Pain Managment: Goal: General experience of comfort will improve Outcome: Progressing   Problem: Nutrition: Goal: Adequate nutrition will be maintained Outcome: Progressing   

## 2018-11-01 NOTE — Progress Notes (Signed)
PROGRESS NOTE  Brittany Schultz QMV:[id number] DOB: [birth year] DOA: 10/29/2018 PCP: Angela Cox, MD  HPI/Brief Narrative  Brittany Schultz is a 84 y.o. year old female with medical history significant for AF on anticoagulation, chronic venous stasis lower extremity ulcers, HTN,  who presented on 10/29/2018 with reports from her facility [facility]) with one day of of increased lethargy, less oriented (reportedly normallly alert and oriented x 4), worsening drainage from chronic lower extremity ulcers and fever and was found to have severe sepsis presumed secondary to cellulitis of chronic LE ulcers.  ED course: Patient was found to have fever of 102.1, with blood pressure 104/50 requiring fluid resuscitation with 1.7 L normal saline, blood cultures were obtained, lactic acid was 2.2.  Flu panel was negative CMP was unremarkable.  Initial white count was 20.1.  UA was unremarkable.  INR was 2.33  X-ray of left fibula with no acute abnormalities, x-ray of right fibula with no evidence of osteomyelitis X-rays of right and left ankles show no evidence of fractures Chest x-ray shows COPD otherwise no acute changes. Empirically started on aztreonam, Flagyl, vancomycin    Subjective Much more pleasant today. Still complaining of pain in legs. Granddaughter at bedside  Assessment/Plan:  #Sepsis secondary to S. Pyogens bacteremia, suspected source lower leg cellulitis. Hemodynamically stable and sepsis physiology resolved. Remains afebrile on deescalated antibiotic regimen of cefazolin and clindamycin.  Blood cultures pansensitive will complete for last dose of IV cefazolin on 11/18 will then transition to Keflex 500 mg every 6 hours for additional 7 days starting 11/19.  Discussed with ID/curbside O (Dr. Johny Sax) on 11/18. Lactic acidosis resolved and BP much better  #Acute toxic encephalopathy.  Improving.  Likely delirium with acute exacerbation in the setting of sepsis  physiology infection.  Continue to reorient and continue delirium precautions delirium precautions  #HTN, at goal. BP range in last 24 hours 104-110/52-53. Much better, no current need to resume home lasix and spirinolactone, will monitor. Continue  home coreg. Holding home lasix and spironolactone due to initial hypotension on admission  #Afib, stable. Home coreg and xarelto  #Depression,stable continue home zoloft  #Hypothyroidism, stable. Home synthroid  #Allergies.  Discussion with granddaughter has allergies to multiple medications and antibiotics.  In the past was given clindamycinAnd developed redness and itchiness based on ED notes at that time 07/2018 patient did tolerate switching to doxycycline.  Suspect patient may need to continue clindamycin and given no MRSA on blood culture is not known strep pyogenes.  Will monitor for additional 24 hours on Keflex alone 11/19 1.  Family agreeable with plan.    Code Status: DNR   Family Communication: Discussed with granddaughter Berlinda Last.  Has been to continue further updates with her husband Loraine Leriche (also physician) [phone]  Disposition Plan: transfer to floor, awaiting culture sensitivities    Consultants:  none     Procedures:  none   Antimicrobials:  Cefazolin and clinda > 11/02/18  Vancomycin, Flagyl, and Levaquin >10/29/17  Cultures:  Wound culture (right and left leg)- neg for strep or staph, 11/18  Blood cultures 1 of 2, strep pyogens, 11/15  Telemetry:yes  DVT prophylaxis:  warfarin   Objective: Vitals:   10/31/18 1924 11/01/18 0447 11/01/18 1037 11/01/18 1318  BP:  138/78 (!) 148/88 128/77  Pulse:  90 86 81  Resp:  (!) 22 20 16   Temp: 98.6 F (37 C) 98.4 F (36.9 C)  98.4 F (36.9 C)  TempSrc: Axillary  Oral  SpO2:  96% 97% 97%  Weight:      Height:        Intake/Output Summary (Last 24 hours) at 11/01/2018 1421 Last data filed at 11/01/2018 6237 Gross per 24 hour  Intake 1971.24 ml   Output 150 ml  Net 1821.24 ml   Filed Weights   10/29/18 1811  Weight: 54.4 kg    Exam:  Constitutional: Elderly female, lying in bed no acute distress Eyes: EOMI, anicteric, normal conjunctivae Cardiovascular: RRR no MRGs, with scant edema bilateral lower legs Respiratory: Normal respiratory effort, clear breath sounds  Abdomen: Soft,non-tender,  Skin: Open abrasions in the wound is of bilateral ankles to lower leg with serous drainage, exquisitely tender to touch but the problem Neurologic: Grossly no focal neuro deficit. Psychiatric: Very pleasant today, alert to self and place  Data Reviewed: CBC: Recent Labs  Lab 10/29/18 1823 10/30/18 0308 10/31/18 0349 11/01/18 0610  WBC 20.1* 23.4* 13.7* 12.0*  NEUTROABS 18.5*  --   --   --   HGB 12.6 10.4* 11.7* 12.1  HCT 39.3 33.8* 36.8 38.4  MCV 98.7 101.2* 98.7 99.7  PLT 215 191 197 212   Basic Metabolic Panel: Recent Labs  Lab 10/29/18 1823 10/30/18 0308 10/31/18 0349 11/01/18 0610  NA 136 140 140 139  K 5.0 4.1 4.0 4.0  CL 104 111 112* 110  CO2 23 17* 18* 17*  GLUCOSE 110* 114* 74 86  BUN 23 24* 26* 24*  CREATININE 0.93 0.76 0.90 0.87  CALCIUM 8.8* 7.8* 7.9* 8.2*  MG  --   --  1.7  --    GFR: Estimated Creatinine Clearance: 34 mL/min (by C-G formula based on SCr of 0.87 mg/dL). Liver Function Tests: Recent Labs  Lab 10/29/18 1823 10/30/18 0308 10/31/18 0349  AST 89* 64* 37  ALT 57* 48* 34  ALKPHOS 141* 107 98  BILITOT 1.0 1.4* 0.9  PROT 7.4 5.9* 6.6  ALBUMIN 3.4* 2.7* 2.9*   No results for input(s): LIPASE, AMYLASE in the last 168 hours. No results for input(s): AMMONIA in the last 168 hours. Coagulation Profile: Recent Labs  Lab 10/29/18 1823 10/30/18 0308  INR 2.33 2.52   Cardiac Enzymes: No results for input(s): CKTOTAL, CKMB, CKMBINDEX, TROPONINI in the last 168 hours. BNP (last 3 results) No results for input(s): PROBNP in the last 8760 hours. HbA1C: No results for input(s): HGBA1C  in the last 72 hours. CBG: No results for input(s): GLUCAP in the last 168 hours. Lipid Profile: No results for input(s): CHOL, HDL, LDLCALC, TRIG, CHOLHDL, LDLDIRECT in the last 72 hours. Thyroid Function Tests: No results for input(s): TSH, T4TOTAL, FREET4, T3FREE, THYROIDAB in the last 72 hours. Anemia Panel: No results for input(s): VITAMINB12, FOLATE, FERRITIN, TIBC, IRON, RETICCTPCT in the last 72 hours. Urine analysis:    Component Value Date/Time   COLORURINE YELLOW 10/29/2018 1823   APPEARANCEUR CLEAR 10/29/2018 1823   LABSPEC 1.015 10/29/2018 1823   PHURINE 5.0 10/29/2018 1823   GLUCOSEU NEGATIVE 10/29/2018 1823   HGBUR NEGATIVE 10/29/2018 1823   BILIRUBINUR NEGATIVE 10/29/2018 1823   KETONESUR NEGATIVE 10/29/2018 1823   PROTEINUR NEGATIVE 10/29/2018 1823   UROBILINOGEN 0.2 10/30/2014 2242   NITRITE NEGATIVE 10/29/2018 1823   LEUKOCYTESUR NEGATIVE 10/29/2018 1823   Sepsis Labs: @LABRCNTIP (procalcitonin:4,lacticidven:4)  ) Recent Results (from the past 240 hour(s))  Blood Culture (routine x 2)     Status: Abnormal   Collection Time: 10/29/18  6:23 PM  Result Value Ref Range Status  Specimen Description   Final    BLOOD RIGHT HAND Performed at [facility], [address] [place], [place] [address]    Special Requests   Final    BOTTLES DRAWN AEROBIC AND ANAEROBIC Blood Culture adequate volume Performed at [facility], [address] [place], [place] [address]    Culture  Setup Time   Final    GRAM POSITIVE COCCI IN CHAINS IN BOTH AEROBIC AND ANAEROBIC BOTTLES CRITICAL RESULT CALLED TO, READ BACK BY AND VERIFIED WITH: PHARM A PHAM [phone] BY GF    Culture (A)  Final    GROUP A STREP (S.PYOGENES) ISOLATED HEALTH DEPARTMENT NOTIFIED FAXED 11/01/18 CTJ Performed at [facility], [address] [place], [place] [address]    Report Status 11/01/2018 FINAL  Final   Organism ID, Bacteria GROUP A STREP  (S.PYOGENES) ISOLATED  Final      Susceptibility   Group a strep (s.pyogenes) isolated - MIC*    PENICILLIN <=0.06 SENSITIVE Sensitive     CEFTRIAXONE <=0.12 SENSITIVE Sensitive     ERYTHROMYCIN <=0.12 SENSITIVE Sensitive     LEVOFLOXACIN 0.5 SENSITIVE Sensitive     VANCOMYCIN <=0.12 SENSITIVE Sensitive     TIGECYCLINE <=0.06 SENSITIVE Sensitive     * GROUP A STREP (S.PYOGENES) ISOLATED  Urine culture     Status: None   Collection Time: 10/29/18  6:23 PM  Result Value Ref Range Status   Specimen Description   Final    URINE, RANDOM Performed at [facility], [address], [place], [place] [address]    Special Requests   Final    NONE Performed at [facility], [address], [place], [place] [address]    Culture   Final    NO GROWTH Performed at [facility], [address] [place], [place] [address]    Report Status 10/31/2018 FINAL  Final  Blood Culture ID Panel (Reflexed)     Status: Abnormal   Collection Time: 10/29/18  6:23 PM  Result Value Ref Range Status   Enterococcus species NOT DETECTED NOT DETECTED Final   Listeria monocytogenes NOT DETECTED NOT DETECTED Final   Staphylococcus species NOT DETECTED NOT DETECTED Final   Staphylococcus aureus (BCID) NOT DETECTED NOT DETECTED Final   Streptococcus species DETECTED (A) NOT DETECTED Final    Comment: CRITICAL RESULT CALLED TO, READ BACK BY AND VERIFIED WITH: PHARMD A PHAM [phone] BY GF    Streptococcus agalactiae NOT DETECTED NOT DETECTED Final   Streptococcus pneumoniae NOT DETECTED NOT DETECTED Final   Streptococcus pyogenes DETECTED (A) NOT DETECTED Final    Comment: CRITICAL RESULT CALLED TO, READ BACK BY AND VERIFIED WITH: PHARMD A PHAM [phone] BY GF    Acinetobacter baumannii NOT DETECTED NOT DETECTED Final   Enterobacteriaceae species NOT DETECTED NOT DETECTED Final   Enterobacter cloacae complex NOT DETECTED NOT DETECTED Final   Escherichia coli NOT  DETECTED NOT DETECTED Final   Klebsiella oxytoca NOT DETECTED NOT DETECTED Final   Klebsiella pneumoniae NOT DETECTED NOT DETECTED Final   Proteus species NOT DETECTED NOT DETECTED Final   Serratia marcescens NOT DETECTED NOT DETECTED Final   Haemophilus influenzae NOT DETECTED NOT DETECTED Final   Neisseria meningitidis NOT DETECTED NOT DETECTED Final   Pseudomonas aeruginosa NOT DETECTED NOT DETECTED Final   Candida albicans NOT DETECTED NOT DETECTED Final   Candida glabrata NOT DETECTED NOT DETECTED Final   Candida krusei NOT  DETECTED NOT DETECTED Final   Candida parapsilosis NOT DETECTED NOT DETECTED Final   Candida tropicalis NOT DETECTED NOT DETECTED Final    Comment: Performed at [facility], [address], [place], [place] [address]  MRSA PCR Screening     Status: None   Collection Time: 10/29/18 10:58 PM  Result Value Ref Range Status   MRSA by PCR NEGATIVE NEGATIVE Final    Comment:        The GeneXpert MRSA Assay (FDA approved for NASAL specimens only), is one component of a comprehensive MRSA colonization surveillance program. It is not intended to diagnose MRSA infection nor to guide or monitor treatment for MRSA infections. Performed at [facility], [address], [place], [place] [address]   Blood Culture (routine x 2)     Status: None (Preliminary result)   Collection Time: 10/30/18  3:08 AM  Result Value Ref Range Status   Specimen Description   Final    BLOOD BLOOD RIGHT HAND Performed at [facility], [address], [place], [place] [address]    Special Requests   Final    BOTTLES DRAWN AEROBIC ONLY Blood Culture adequate volume Performed at [facility], [address], [place], [place] [address]    Culture   Final    NO GROWTH 2 DAYS Performed at [facility], [address], [place], [place] [address]    Report Status PENDING  Incomplete      Studies: No results  found.  Scheduled Meds: . carvedilol  6.25 mg Oral Daily  . [START ON 11/02/2018] cephALEXin  500 mg Oral Q6H  . diphenhydrAMINE  25 mg Intravenous Once  . levothyroxine  100 mcg Oral QHS  . rivaroxaban  15 mg Oral Q supper  . sertraline  75 mg Oral Daily    Continuous Infusions: . sodium chloride 1,000 mL (11/01/18 0057)  .  ceFAZolin (ANCEF) IV 2 g (11/01/18 1660)     LOS: 3 days     Laverna Peace, MD [facility] Pager [phone]  If 7PM-7AM, please contact night-coverage [email or web address] Password [email or web address] 11/01/2018, 2:21 PM

## 2018-11-01 NOTE — Care Management Important Message (Signed)
Important Message  Patient Details  Name: JELESA MANGINI MRN: [id number] Date of Birth: 1922-01-22   Medicare Important Message Given:  Yes    Caren Macadam 11/01/2018, 11:35 AMImportant Message  Patient Details  Name: SARAN LAVIOLETTE MRN: [id number] Date of Birth: 03-15-1922   Medicare Important Message Given:  Yes    Caren Macadam 11/01/2018, 11:35 AM

## 2018-11-02 LAB — CBC
HEMATOCRIT: 39 % (ref 36.0–46.0)
Hemoglobin: 12.2 g/dL (ref 12.0–15.0)
MCH: 30.4 pg (ref 26.0–34.0)
MCHC: 31.3 g/dL (ref 30.0–36.0)
MCV: 97.3 fL (ref 80.0–100.0)
NRBC: 0 % (ref 0.0–0.2)
Platelets: 249 10*3/uL (ref 150–400)
RBC: 4.01 MIL/uL (ref 3.87–5.11)
RDW: 15.9 % — AB (ref 11.5–15.5)
WBC: 10.6 10*3/uL — ABNORMAL HIGH (ref 4.0–10.5)

## 2018-11-02 LAB — BASIC METABOLIC PANEL
Anion gap: 10 (ref 5–15)
BUN: 19 mg/dL (ref 8–23)
CO2: 18 mmol/L — AB (ref 22–32)
Calcium: 8.3 mg/dL — ABNORMAL LOW (ref 8.9–10.3)
Chloride: 113 mmol/L — ABNORMAL HIGH (ref 98–111)
Creatinine, Ser: 0.71 mg/dL (ref 0.44–1.00)
GFR calc Af Amer: >60 mL/min (ref >60)
GFR calc non Af Amer: >60 mL/min (ref >60)
Glucose, Bld: 71 mg/dL (ref 70–99)
Potassium: 4 mmol/L (ref 3.5–5.1)
SODIUM: 141 mmol/L (ref 135–145)

## 2018-11-02 NOTE — Evaluation (Signed)
Physical Therapy Evaluation Patient Details Name: Brittany Schultz MRN: [id number] DOB: 03-25-1922 Today's Date: 11/02/2018   History of Present Illness   Brittany Schultz is a 84 y.o. year old female with medical history significant for AF on anticoagulation, chronic venous stasis lower extremity ulcers, HTN, admitted due to lethargy, fever and increased drainage from LE ulcers.  Clinical Impression  Patient presents with limited mobility and potentially close to baseline, but pt unreliable historian reports getting around in wheelchair and walking at ALF.  Could benefit from skilled PT in the acute setting as pt motivated for OOB and able to tolerate EOB shortly due to fecal incontinence.  Likely able to return to ALF at d/c and follow up PT depending on prior functional level.      Follow Up Recommendations Supervision/Assistance - 24 hour;Other (comment)(return to ALF)    Equipment Recommendations  None recommended by PT    Recommendations for Other Services       Precautions / Restrictions Precautions Precautions: Fall Restrictions Weight Bearing Restrictions: No      Mobility  Bed Mobility Overal bed mobility: Needs Assistance Bed Mobility: Rolling;Supine to Sit;Sit to Supine Rolling: Mod assist;Max assist   Supine to sit: Max assist;HOB elevated Sit to supine: Total assist   General bed mobility comments: rolling with pt using rail some and assist with pad to turn hips, to sit assist for moving LE's and scooting hips and lifting trunk, assist to supine for legs and trunk  Transfers                    Ambulation/Gait                Stairs            Wheelchair Mobility    Modified Rankin (Stroke Patients Only)       Balance Overall balance assessment: Needs assistance Sitting-balance support: Bilateral upper extremity supported;Feet unsupported Sitting balance-Leahy Scale: Poor Sitting balance - Comments: sitting EOB with posterior bias  both hands on bed and feet barely touching floor with legs extended despite cues to bend knees                                     Pertinent Vitals/Pain Pain Assessment: Faces Faces Pain Scale: Hurts little more Pain Location: generalized and perineum from recent diarrhea. Pain Descriptors / Indicators: Grimacing;Moaning Pain Intervention(s): Monitored during session;Repositioned;Limited activity within patient's tolerance    Home Living Family/patient expects to be discharged to:: Assisted living                      Prior Function                 Hand Dominance        Extremity/Trunk Assessment        Lower Extremity Assessment Lower Extremity Assessment: RLE deficits/detail;LLE deficits/detail RLE Deficits / Details: AAROM limited ankle movement with decreased sensation, knee flexion about 35-40 degrees and hip flexion limited in sitting; strength grossly 3+/5 knee flexion 3/5 hip flexion;  lower legs wrapped with kerlix and xeroform and with discoloration from chronic PVD RLE Sensation: decreased light touch RLE Coordination: decreased gross motor LLE Deficits / Details: AAROM limited ankle movement with decreased sensation, knee flexion about 35-40 degrees and hip flexion limited in sitting; strength grossly 3+/5 knee flexion 3/5 hip flexion; lower legs wrapped with  kerlix and xeroform and with discoloration from chronic PVD LLE Sensation: decreased light touch LLE Coordination: decreased gross motor    Cervical / Trunk Assessment Cervical / Trunk Assessment: Kyphotic  Communication   Communication: Expressive difficulties;HOH(mild dysarthria, wears hearing aides)  Cognition Arousal/Alertness: Awake/alert Behavior During Therapy: WFL for tasks assessed/performed Overall Cognitive Status: No family/caregiver present to determine baseline cognitive functioning                                 General Comments: unreliable  historian, reports walked with walker and gets up to wheelchair on her own, but LE assessment reveals significant atrophy in lower legs and stiffness in joints      General Comments General comments (skin integrity, edema, etc.): incontinent of stool in sitting, assist to roll in supine with NT in to assist with hygiene    Exercises     Assessment/Plan    PT Assessment Patient needs continued PT services  PT Problem List Decreased range of motion;Decreased strength;Decreased mobility;Decreased balance;Decreased knowledge of use of DME;Decreased activity tolerance       PT Treatment Interventions Functional mobility training;Balance training;Therapeutic exercise;Patient/family education;Therapeutic activities    PT Goals (Current goals can be found in the Care Plan section)  Acute Rehab PT Goals Patient Stated Goal: eager for OOB PT Goal Formulation: With patient Time For Goal Achievement: 11/16/18 Potential to Achieve Goals: Fair    Frequency Min 2X/week   Barriers to discharge        Co-evaluation               AM-PAC PT "6 Clicks" Daily Activity  Outcome Measure Difficulty turning over in bed (including adjusting bedclothes, sheets and blankets)?: Unable Difficulty moving from lying on back to sitting on the side of the bed? : Unable Difficulty sitting down on and standing up from a chair with arms (e.g., wheelchair, bedside commode, etc,.)?: Unable Help needed moving to and from a bed to chair (including a wheelchair)?: Total Help needed walking in hospital room?: Total Help needed climbing 3-5 steps with a railing? : Total 6 Click Score: 6    End of Session   Activity Tolerance: Patient tolerated treatment well Patient left: with call bell/phone within reach;in bed;with nursing/sitter in room   PT Visit Diagnosis: Muscle weakness (generalized) (M62.81)    Time: 3149-7026 PT Time Calculation (min) (ACUTE ONLY): 37 min   Charges:   PT Evaluation $PT  Eval Moderate Complexity: 1 Mod PT Treatments $Therapeutic Activity: 8-22 mins        Sheran Lawless, PT Acute Rehabilitation Services [phone] 11/02/2018   Brittany Schultz 11/02/2018, 4:43 PM

## 2018-11-02 NOTE — Progress Notes (Signed)
PROGRESS NOTE  Brittany Schultz HFW:[id number] DOB: 1922-08-16 DOA: 10/29/2018 PCP: Angela Cox, MD  HPI/Brief Narrative  Brittany Schultz is a 84 y.o. year old female with medical history significant for AF on anticoagulation, chronic venous stasis lower extremity ulcers, HTN,  who presented on 10/29/2018 with reports from her facility [facility]) with one day of of increased lethargy, less oriented (reportedly normallly alert and oriented x 4), worsening drainage from chronic lower extremity ulcers and fever and was found to have severe sepsis presumed secondary to cellulitis of chronic LE ulcers.  ED course: Patient was found to have fever of 102.1, with blood pressure 104/50 requiring fluid resuscitation with 1.7 L normal saline, blood cultures were obtained, lactic acid was 2.2.  Flu panel was negative CMP was unremarkable.  Initial white count was 20.1.  UA was unremarkable.  INR was 2.33  X-ray of left fibula with no acute abnormalities, x-ray of right fibula with no evidence of osteomyelitis X-rays of right and left ankles show no evidence of fractures Chest x-ray shows COPD otherwise no acute changes. Empirically started on aztreonam, Flagyl, vancomycin    Subjective No acute events overnight  Assessment/Plan:  #Sepsis secondary to S. Pyogens bacteremia, suspected source lower leg cellulitis. Hemodynamically stable and sepsis physiology resolved.  After discussion with ID via phone curbside (Dr. Johny Sax) on 11/18 since blood cultures pansensitive completed last dose of IV cefazolin on 11/18 and transitioned to Keflex 500 mg every 6 hours for additional 7 days starting 11/02/17. Monitor over 24 hours to ensure remains afebrile given patient has had multiple treatments for recurrent infections of the legs.  If remains afebrile anticipate discharge hopefully back to ALF PT recommends SNF, consulted social worker, family hopeful patient can return back to her previous  ALF with extended care.Marland Kitchen    #Acute toxic encephalopathy.  Improving. Alert to self and place.  Likely delirium with acute exacerbation in the setting of sepsis physiology infection.  Continue to reorient and continue delirium precautions delirium precautions  #HTN, at goal. BP range in last 24 hours 104-110/52-53. Much better, no current need to resume home lasix and spirinolactone, will monitor. Continue  home coreg. Holding home lasix and spironolactone due to initial hypotension on admission  #Afib, stable. Home coreg and xarelto  #Depression,stable continue home zoloft  #Hypothyroidism, stable. Home synthroid  #Allergies.  Discussion with granddaughter on 11/19 patient has allergies to multiple medications and antibiotics.  In the past was given clindamycin and developed redness and itchiness based on ED notes at that time 07/2018 patient did tolerate switching to doxycycline.    Code Status: DNR   Family Communication: Discussed with granddaughter Berlinda Last. Asked to continue further updates with her husband Loraine Leriche (also physician) [phone]  Disposition Plan: s/w assistance with SNF    Consultants:  none     Procedures:  none   Antimicrobials:  Cefazolin and clinda > 11/02/18  Vancomycin, Flagyl, and Levaquin >10/29/17  Cultures:  Wound culture (right and left leg)- neg for strep or staph, 11/18  Blood cultures 1 of 2, strep pyogens, 11/15  Telemetry:yes  DVT prophylaxis:  warfarin   Objective: Vitals:   11/02/18 1411 11/02/18 1446 11/02/18 1600 11/02/18 2029  BP: (!) 144/81 128/76 126/78 140/79  Pulse: (!) 101 96 90 87  Resp:    19  Temp: 98.2 F (36.8 C)   98.9 F (37.2 C)  TempSrc: Oral   Oral  SpO2: 94%   93%  Weight:  Height:        Intake/Output Summary (Last 24 hours) at 11/02/2018 2358 Last data filed at 11/02/2018 1841 Gross per 24 hour  Intake 2041.6 ml  Output -  Net 2041.6 ml   Filed Weights   10/29/18 1811  Weight:  54.4 kg    Exam:  Constitutional: Elderly female, lying in bed no acute distress Eyes: EOMI, anicteric, normal conjunctivae Cardiovascular: RRR no MRGs, with scant edema bilateral lower legs Respiratory: Normal respiratory effort, clear breath sounds  Abdomen: Soft,non-tender,  Skin: Open abrasions in the wound is of bilateral ankles to lower leg with serous drainage, exquisitely tender to touch  Neurologic: Grossly no focal neuro deficit.   Data Reviewed: CBC: Recent Labs  Lab 10/29/18 1823 10/30/18 0308 10/31/18 0349 11/01/18 0610 11/02/18 0600  WBC 20.1* 23.4* 13.7* 12.0* 10.6*  NEUTROABS 18.5*  --   --   --   --   HGB 12.6 10.4* 11.7* 12.1 12.2  HCT 39.3 33.8* 36.8 38.4 39.0  MCV 98.7 101.2* 98.7 99.7 97.3  PLT 215 191 197 212 249   Basic Metabolic Panel: Recent Labs  Lab 10/29/18 1823 10/30/18 0308 10/31/18 0349 11/01/18 0610 11/02/18 0600  NA 136 140 140 139 141  K 5.0 4.1 4.0 4.0 4.0  CL 104 111 112* 110 113*  CO2 23 17* 18* 17* 18*  GLUCOSE 110* 114* 74 86 71  BUN 23 24* 26* 24* 19  CREATININE 0.93 0.76 0.90 0.87 0.71  CALCIUM 8.8* 7.8* 7.9* 8.2* 8.3*  MG  --   --  1.7  --   --    GFR: Estimated Creatinine Clearance: 36.9 mL/min (by C-G formula based on SCr of 0.71 mg/dL). Liver Function Tests: Recent Labs  Lab 10/29/18 1823 10/30/18 0308 10/31/18 0349  AST 89* 64* 37  ALT 57* 48* 34  ALKPHOS 141* 107 98  BILITOT 1.0 1.4* 0.9  PROT 7.4 5.9* 6.6  ALBUMIN 3.4* 2.7* 2.9*   No results for input(s): LIPASE, AMYLASE in the last 168 hours. No results for input(s): AMMONIA in the last 168 hours. Coagulation Profile: Recent Labs  Lab 10/29/18 1823 10/30/18 0308  INR 2.33 2.52   Cardiac Enzymes: No results for input(s): CKTOTAL, CKMB, CKMBINDEX, TROPONINI in the last 168 hours. BNP (last 3 results) No results for input(s): PROBNP in the last 8760 hours. HbA1C: No results for input(s): HGBA1C in the last 72 hours. CBG: No results for  input(s): GLUCAP in the last 168 hours. Lipid Profile: No results for input(s): CHOL, HDL, LDLCALC, TRIG, CHOLHDL, LDLDIRECT in the last 72 hours. Thyroid Function Tests: No results for input(s): TSH, T4TOTAL, FREET4, T3FREE, THYROIDAB in the last 72 hours. Anemia Panel: No results for input(s): VITAMINB12, FOLATE, FERRITIN, TIBC, IRON, RETICCTPCT in the last 72 hours. Urine analysis:    Component Value Date/Time   COLORURINE YELLOW 10/29/2018 1823   APPEARANCEUR CLEAR 10/29/2018 1823   LABSPEC 1.015 10/29/2018 1823   PHURINE 5.0 10/29/2018 1823   GLUCOSEU NEGATIVE 10/29/2018 1823   HGBUR NEGATIVE 10/29/2018 1823   BILIRUBINUR NEGATIVE 10/29/2018 1823   KETONESUR NEGATIVE 10/29/2018 1823   PROTEINUR NEGATIVE 10/29/2018 1823   UROBILINOGEN 0.2 10/30/2014 2242   NITRITE NEGATIVE 10/29/2018 1823   LEUKOCYTESUR NEGATIVE 10/29/2018 1823   Sepsis Labs: @LABRCNTIP (procalcitonin:4,lacticidven:4)  ) Recent Results (from the past 240 hour(s))  Blood Culture (routine x 2)     Status: Abnormal   Collection Time: 10/29/18  6:23 PM  Result Value Ref Range Status  Specimen Description   Final    BLOOD RIGHT HAND Performed at [facility], [address] [place], [place] [address]    Special Requests   Final    BOTTLES DRAWN AEROBIC AND ANAEROBIC Blood Culture adequate volume Performed at [facility], [address] [place], [place] [address]    Culture  Setup Time   Final    GRAM POSITIVE COCCI IN CHAINS IN BOTH AEROBIC AND ANAEROBIC BOTTLES CRITICAL RESULT CALLED TO, READ BACK BY AND VERIFIED WITH: PHARM A PHAM [phone] BY GF    Culture (A)  Final    GROUP A STREP (S.PYOGENES) ISOLATED HEALTH DEPARTMENT NOTIFIED FAXED 11/01/18 CTJ Performed at [facility], [address], [place], [place] [address]    Report Status 11/01/2018 FINAL  Final   Organism ID, Bacteria GROUP A STREP (S.PYOGENES) ISOLATED  Final      Susceptibility    Group a strep (s.pyogenes) isolated - MIC*    PENICILLIN <=0.06 SENSITIVE Sensitive     CEFTRIAXONE <=0.12 SENSITIVE Sensitive     ERYTHROMYCIN <=0.12 SENSITIVE Sensitive     LEVOFLOXACIN 0.5 SENSITIVE Sensitive     VANCOMYCIN <=0.12 SENSITIVE Sensitive     TIGECYCLINE <=0.06 SENSITIVE Sensitive     * GROUP A STREP (S.PYOGENES) ISOLATED  Urine culture     Status: None   Collection Time: 10/29/18  6:23 PM  Result Value Ref Range Status   Specimen Description   Final    URINE, RANDOM Performed at [facility], [address], [place], [place] [address]    Special Requests   Final    NONE Performed at [facility], [address], [place], [place] [address]    Culture   Final    NO GROWTH Performed at [facility], [address], [place], [place] [address]    Report Status 10/31/2018 FINAL  Final  Blood Culture ID Panel (Reflexed)     Status: Abnormal   Collection Time: 10/29/18  6:23 PM  Result Value Ref Range Status   Enterococcus species NOT DETECTED NOT DETECTED Final   Listeria monocytogenes NOT DETECTED NOT DETECTED Final   Staphylococcus species NOT DETECTED NOT DETECTED Final   Staphylococcus aureus (BCID) NOT DETECTED NOT DETECTED Final   Streptococcus species DETECTED (A) NOT DETECTED Final    Comment: CRITICAL RESULT CALLED TO, READ BACK BY AND VERIFIED WITH: PHARMD A PHAM [phone] BY GF    Streptococcus agalactiae NOT DETECTED NOT DETECTED Final   Streptococcus pneumoniae NOT DETECTED NOT DETECTED Final   Streptococcus pyogenes DETECTED (A) NOT DETECTED Final    Comment: CRITICAL RESULT CALLED TO, READ BACK BY AND VERIFIED WITH: PHARMD A PHAM [phone] BY GF    Acinetobacter baumannii NOT DETECTED NOT DETECTED Final   Enterobacteriaceae species NOT DETECTED NOT DETECTED Final   Enterobacter cloacae complex NOT DETECTED NOT DETECTED Final   Escherichia coli NOT DETECTED NOT DETECTED Final   Klebsiella oxytoca NOT  DETECTED NOT DETECTED Final   Klebsiella pneumoniae NOT DETECTED NOT DETECTED Final   Proteus species NOT DETECTED NOT DETECTED Final   Serratia marcescens NOT DETECTED NOT DETECTED Final   Haemophilus influenzae NOT DETECTED NOT DETECTED Final   Neisseria meningitidis NOT DETECTED NOT DETECTED Final   Pseudomonas aeruginosa NOT DETECTED NOT DETECTED Final   Candida albicans NOT DETECTED NOT DETECTED Final   Candida glabrata NOT DETECTED NOT DETECTED Final   Candida krusei NOT  DETECTED NOT DETECTED Final   Candida parapsilosis NOT DETECTED NOT DETECTED Final   Candida tropicalis NOT DETECTED NOT DETECTED Final    Comment: Performed at [facility], [address] [place], [place] [address]  MRSA PCR Screening     Status: None   Collection Time: 10/29/18 10:58 PM  Result Value Ref Range Status   MRSA by PCR NEGATIVE NEGATIVE Final    Comment:        The GeneXpert MRSA Assay (FDA approved for NASAL specimens only), is one component of a comprehensive MRSA colonization surveillance program. It is not intended to diagnose MRSA infection nor to guide or monitor treatment for MRSA infections. Performed at [facility], [address], [place], [place] [address]   Blood Culture (routine x 2)     Status: None (Preliminary result)   Collection Time: 10/30/18  3:08 AM  Result Value Ref Range Status   Specimen Description   Final    BLOOD BLOOD RIGHT HAND Performed at [facility], [address] [place], [place] [address]    Special Requests   Final    BOTTLES DRAWN AEROBIC ONLY Blood Culture adequate volume Performed at [facility], [address], [place], [place] [address]    Culture   Final    NO GROWTH 3 DAYS Performed at [facility], [address] [place], [place] [address]    Report Status PENDING  Incomplete      Studies: No results found.  Scheduled Meds: . carvedilol  6.25 mg Oral Daily  .  cephALEXin  500 mg Oral Q6H  . diphenhydrAMINE  25 mg Intravenous Once  . levothyroxine  100 mcg Oral QHS  . rivaroxaban  15 mg Oral Q supper  . sertraline  75 mg Oral Daily    Continuous Infusions: . sodium chloride 1,000 mL (11/02/18 1841)     LOS: 4 days     Laverna Peace, MD [facility] Pager [phone]  If 7PM-7AM, please contact night-coverage [email or web address] Password [email or web address] 11/02/2018, 11:58 PM

## 2018-11-02 NOTE — Progress Notes (Signed)
Patient is alert and oriented x2. Pain complained after dressing changed. Medication was given as MD ordered. Will recheck BP and HR in 30 minutes

## 2018-11-03 ENCOUNTER — Inpatient Hospital Stay (HOSPITAL_COMMUNITY): Payer: Medicare Other

## 2018-11-03 DIAGNOSIS — L03119 Cellulitis of unspecified part of limb: Secondary | ICD-10-CM

## 2018-11-03 LAB — CBC
HCT: 39.5 % (ref 36.0–46.0)
Hemoglobin: 12.5 g/dL (ref 12.0–15.0)
MCH: 30.8 pg (ref 26.0–34.0)
MCHC: 31.6 g/dL (ref 30.0–36.0)
MCV: 97.3 fL (ref 80.0–100.0)
PLATELETS: 261 10*3/uL (ref 150–400)
RBC: 4.06 MIL/uL (ref 3.87–5.11)
RDW: 16.1 % — AB (ref 11.5–15.5)
WBC: 11.7 10*3/uL — ABNORMAL HIGH (ref 4.0–10.5)
nRBC: 0 % (ref 0.0–0.2)

## 2018-11-03 LAB — BASIC METABOLIC PANEL
Anion gap: 10 (ref 5–15)
BUN: 17 mg/dL (ref 8–23)
CALCIUM: 8.5 mg/dL — AB (ref 8.9–10.3)
CO2: 19 mmol/L — ABNORMAL LOW (ref 22–32)
CREATININE: 0.66 mg/dL (ref 0.44–1.00)
Chloride: 110 mmol/L (ref 98–111)
GFR calc Af Amer: >60 mL/min (ref >60)
GLUCOSE: 82 mg/dL (ref 70–99)
Potassium: 3.7 mmol/L (ref 3.5–5.1)
Sodium: 139 mmol/L (ref 135–145)

## 2018-11-03 MED ORDER — CEPHALEXIN 500 MG PO CAPS: 500.0000 mg | ORAL_CAPSULE | Freq: Four times a day (QID) | ORAL | 0 refills | Status: AC

## 2018-11-03 MED ORDER — GUAIFENESIN ER 600 MG PO TB12
1200.0000 mg | ORAL_TABLET | Freq: Two times a day (BID) | ORAL | Status: DC
  Administered 2018-11-03 – 2018-11-04 (×3): 1200 mg via ORAL
  Filled 2018-11-03 (×4): qty 2

## 2018-11-03 MED ORDER — LIP MEDEX EX OINT: TOPICAL_OINTMENT | CUTANEOUS | 0 refills | Status: DC | PRN

## 2018-11-03 MED ORDER — TRAMADOL HCL 50 MG PO TABS: 50.0000 mg | ORAL_TABLET | ORAL | 0 refills | Status: DC

## 2018-11-03 MED ORDER — GUAIFENESIN ER 600 MG PO TB12: 600.0000 mg | ORAL_TABLET | Freq: Two times a day (BID) | ORAL | 0 refills | Status: AC

## 2018-11-03 NOTE — Discharge Planning (Signed)
Physician Discharge Summary  Brittany Schultz JOI:[id number] DOB: 07/25/1922 DOA: 10/29/2018  PCP: Angela Cox, MD  Admit date: 10/29/2018 Discharge date: 11/03/2018  Admitted From: ALF Disposition:  ALF with Home Health Services  Recommendations for Outpatient Follow-up:  1. Follow up with PCP in 1-2 weeks 2. Follow up with Pulmonary as an outpatient for outpatient PFT's and formal evaluation for COPD 3. Follow up with Wound Care Clinic; [facility] works well for the patient  4. Please obtain CMP/CBC, Mag, Phos in one week 5. Repeat CXR with 3-6 weeks 6. Please follow up on the following pending results:  Home Health: Yes Equipment/Devices: None Recommended by PT  Discharge Condition: Stable CODE STATUS: DO NOT RESUSCITATE  Diet recommendation: SOFT Diet   Brief/Interim Summary: Brittany Schultz is a 84 y.o. year old female with medical history significant for AF on anticoagulation, chronic venous stasis lower extremity ulcers, HTN, and other comorbidities who presented on 10/29/2018 with reports from her facility [facility]) with one day of of increased lethargy, less oriented (reportedly normallly alert and oriented x 4), worsening drainage from chronic lower extremity ulcers and fever and was found to have severe sepsis presumed secondary to cellulitis of chronic LE ulcers.  ED course: Patient was found to have fever of 102.1, with blood pressure 104/50 requiring fluid resuscitation with 1.7 L normal saline, blood cultures were obtained, lactic acid was 2.2.  Flu panel was negative CMP was unremarkable.  Initial white count was 20.1.  UA was unremarkable.  INR was 2.33  Workup revealed:  X-ray of left fibula with no acute abnormalities, x-ray of right fibula with no evidence of osteomyelitis X-rays of right and left ankles show no evidence of fractures Chest x-ray shows COPD otherwise no acute changes. Empirically started on aztreonam, Flagyl, vancomycin  She is  found to have a strep biology knees bacteremia and likely her source was her lower leg.  She was placed on IV cefazolin and has been changed to p.o. Keflex and she will be continued on p.o. Keflex for another 7 days.  She improved and she was deemed medically stable to be discharged.  She will need to follow-up with her primary care physician and have an outpatient referral to pulmonology for official PFTs.  She also need to see the wound clear clinic and per patient's niece [facility] has worked well for the patient in the past but will leave that up to the discretion of the wound care clinic.  Discharge Diagnoses:  Principal Problem:   Cellulitis Active Problems:   Essential hypertension, benign   A-fib (HCC)   Hypothyroidism   Venous stasis ulcer of left lower extremity (HCC)   Long term current use of anticoagulant therapy   Sepsis (HCC)   Sepsis due to cellulitis (HCC)   Encephalopathy in sepsis  Sepsis secondary to S. Pyogens bacteremia, suspected source lower leg cellulitis. -Hemodynamically stable and sepsis physiology resolved.   -Dr. Caleb Popp had a discussion with ID via phone curbside (Dr. Johny Sax) on 11/18 since blood cultures pansensitive completed last dose of IV cefazolin on 11/18 and transitioned to Keflex 500 mg every 6 hours for additional 7 days starting 11/02/17. -Monitored over 24 hours to ensure remains afebrile given patient has had multiple treatments for recurrent infections of the legs and because she remains afebrile anticipate discharge hopefully back to ALF PT recommends SNF. -WBC is 11.7 and stable for the last few days    Acute Toxic Encephalopathy -Improving. Alert to self and  place.   -Likely delirium with acute exacerbation in the setting of sepsis physiology infection.   -Continue to reorient and continue delirium precautions delirium precautions  HTN -At Goal -Resume home lasix and Spironolactone at D/C along with Home Hydralazine.  -Continue  home  coreg.   A Fibrillation -Stable. C/w Home Coreg and Carelto  Depression -Stable  -Continue home zoloft  Hypothyroidism -C/w Home Synthroid  Suspected COPD -Not Decompensated currently -C/w Albuterol 2.5 mg Neb q6hprn Wheezing and SOB -Outpatient PFT's with Pulmonary -CXR showed no Infiltrate by hyperinflated lungs -Added Guaifenesin  Pleural Effusion -Right Sided and Small -Resume home Diuretics and stop IVF  Non-Gap Metabolic Acidosis -Improving -CO2 is now 19 -Follow up as an outpatient and repeat CMP  Discharge Instructions  Discharge Instructions    Call MD for:  difficulty breathing, headache or visual disturbances   Complete by:  As directed    Call MD for:  extreme fatigue   Complete by:  As directed    Call MD for:  hives   Complete by:  As directed    Call MD for:  persistant dizziness or light-headedness   Complete by:  As directed    Call MD for:  persistant nausea and vomiting   Complete by:  As directed    Call MD for:  redness, tenderness, or signs of infection (pain, swelling, redness, odor or green/yellow discharge around incision site)   Complete by:  As directed    Call MD for:  severe uncontrolled pain   Complete by:  As directed    Call MD for:  temperature >100.4   Complete by:  As directed    Diet - low sodium heart healthy   Complete by:  As directed    Discharge instructions   Complete by:  As directed    You were cared for by a hospitalist during your hospital stay. If you have any questions about your discharge medications or the care you received while you were in the hospital after you are discharged, you can call the unit and ask to speak with the hospitalist on call if the hospitalist that took care of you is not available. Once you are discharged, your primary care physician will handle any further medical issues. Please note that NO REFILLS for any discharge medications will be authorized once you are discharged, as it is  imperative that you return to your primary care physician (or establish a relationship with a primary care physician if you do not have one) for your aftercare needs so that they can reassess your need for medications and monitor your lab values.  Follow up with PCP and Wound Clinic. Have PCP refer for outpatient official PFT's for COPD diagnosis. Take all medications as prescribed. If symptoms change or worsen please return to the ED for evaluation   Increase activity slowly   Complete by:  As directed      Allergies as of 11/03/2018      Reactions   Sulfa Antibiotics Rash   Rash w/ sulfonomides furosemide, meloxicam   Codeine    unknown   Penicillins    unknown   Sulfonylureas    unknown   Clindamycin/lincomycin Itching, Rash      Medication List    STOP taking these medications   doxycycline 100 MG capsule Commonly known as:  VIBRAMYCIN   warfarin 2 MG tablet Commonly known as:  COUMADIN     TAKE these medications   acetaminophen 325 MG tablet Commonly  known as:  TYLENOL Take 650 mg by mouth 3 (three) times daily.   carvedilol 6.25 MG tablet Commonly known as:  COREG Take 6.25 mg by mouth daily.   cephALEXin 500 MG capsule Commonly known as:  KEFLEX Take 1 capsule (500 mg total) by mouth every 6 (six) hours for 6 days.   DESITIN 13 % Crea Generic drug:  Zinc Oxide Apply 1 application topically 2 (two) times daily.   diclofenac sodium 1 % Gel Commonly known as:  VOLTAREN Apply 2 g topically 3 (three) times daily. Both lower extremities   furosemide 40 MG tablet Commonly known as:  LASIX Take 1 tablet (40 mg total) by mouth daily.   guaiFENesin 600 MG 12 hr tablet Commonly known as:  MUCINEX Take 1 tablet (600 mg total) by mouth 2 (two) times daily for 10 doses.   hydrALAZINE 25 MG tablet Commonly known as:  APRESOLINE Take 1 tablet (25 mg total) by mouth every 8 (eight) hours. What changed:  when to take this   hydrOXYzine 25 MG tablet Commonly known  as:  ATARAX/VISTARIL Take 1 tablet (25 mg total) by mouth every 8 (eight) hours as needed.   JUICE PLUS FIBRE PO Take 1 capsule by mouth 3 (three) times daily. Take one capsule of each three times a day [employer]   levothyroxine 100 MCG tablet Commonly known as:  SYNTHROID, LEVOTHROID Take 100 mcg by mouth at bedtime.   lip balm ointment Apply topically as needed for lip care.   mineral oil external liquid Place 2 drops into both ears once a week.   potassium chloride SA 20 MEQ tablet Commonly known as:  K-DUR,KLOR-CON Take 20 mEq by mouth daily.   rivaroxaban 20 MG Tabs tablet Commonly known as:  XARELTO Take 20 mg by mouth daily.   sertraline 50 MG tablet Commonly known as:  ZOLOFT Take 75 mg by mouth daily.   spironolactone 25 MG tablet Commonly known as:  ALDACTONE Take 1 tablet (25 mg total) by mouth daily.   traMADol 50 MG tablet Commonly known as:  ULTRAM Take 1 tablet (50 mg total) by mouth as directed. Take 1 tablet BID & Take 1 tablet daily PRN for Pain.   vitamin B-12 1000 MCG tablet Commonly known as:  CYANOCOBALAMIN Take 1,000 mcg by mouth daily.   VITAMIN D (ERGOCALCIFEROL) PO Take 1,000 Units by mouth daily.      Follow-up Information    Angela Cox, MD. Call.   Specialty:  Internal Medicine Why:  Follow up within 1 week  Contact information: [address] [place] [address] [phone]          Allergies  Allergen Reactions  . Sulfa Antibiotics Rash    Rash w/ sulfonomides furosemide, meloxicam  . Codeine     unknown  . Penicillins     unknown  . Sulfonylureas     unknown  . Clindamycin/Lincomycin Itching and Rash   Consultations:  Case was discussed with ID by my partner Dr. Caleb Popp  Procedures/Studies: Dg Tibia/fibula Left  Result Date: 10/29/2018 CLINICAL DATA:  Assess cellulitis.  Sepsis. EXAM: LEFT TIBIA AND FIBULA - 2 VIEW COMPARISON:  None. FINDINGS: No  osseous erosions are seen to suggest osteomyelitis. The tibia and fibula appear grossly intact. Minimal degenerative change is noted at the patellofemoral compartment. The visualized portions of the left knee are grossly unremarkable. The ankle mortise is not well characterized. Mild degenerative change is  suggested at the tibiotalar articulation. Scattered vascular calcifications are seen. IMPRESSION: 1. No osseous erosions seen to suggest osteomyelitis. Tibia and fibula appear grossly intact. 2. Scattered vascular calcifications seen. Electronically Signed   By: Roanna Raider M.D.   On: 10/29/2018 22:39   Dg Tibia/fibula Right  Result Date: 10/29/2018 CLINICAL DATA:  Assess cellulitis.  Sepsis. EXAM: RIGHT TIBIA AND FIBULA - 2 VIEW COMPARISON:  None. FINDINGS: No osseous erosions are seen to suggest osteomyelitis. Degenerative change is noted at the patellofemoral compartment, with joint space narrowing. An enthesophyte is noted at the upper pole of the patella. The tibia and fibula appear grossly intact. The ankle mortise is incompletely assessed, but appears grossly unremarkable. Soft tissue swelling is noted about the medial aspect of the knee and proximal lower leg. Scattered vascular calcifications are seen. IMPRESSION: 1. No osseous erosions seen to suggest osteomyelitis. 2. Scattered vascular calcifications seen. 3. Degenerative change at the patellofemoral compartment. Electronically Signed   By: Roanna Raider M.D.   On: 10/29/2018 22:41   Dg Ankle 2 Views Left  Result Date: 10/29/2018 CLINICAL DATA:  Assess cellulitis.  Sepsis. EXAM: LEFT ANKLE - 2 VIEW COMPARISON:  None. FINDINGS: There is no evidence of fracture or dislocation. No osseous erosions are definitely characterized to suggest osteomyelitis. However, there is diffuse osteopenia of visualized osseous structures, limiting evaluation. Soft tissue swelling is noted about the lower leg and ankle, with a dressing overlying the medial  aspect of the ankle. Diffuse vascular calcifications are seen. Mild degenerative change is noted at the talar dome. A plantar calcaneal spur is seen. An os peroneum is noted. IMPRESSION: 1. No evidence of fracture or dislocation. No definite evidence for osteomyelitis. However, diffuse osteopenia of visualized osseous structures limits evaluation for erosions. 2. Diffuse vascular calcifications seen. 3. Soft tissue swelling about the lower leg and ankle. Electronically Signed   By: Roanna Raider M.D.   On: 10/29/2018 22:43   Dg Ankle 2 Views Right  Result Date: 10/29/2018 CLINICAL DATA:  Assess cellulitis.  Sepsis. EXAM: RIGHT ANKLE - 2 VIEW COMPARISON:  None. FINDINGS: There is no evidence of fracture or dislocation. No osseous erosions are seen to suggest osteomyelitis. The ankle mortise is grossly unremarkable in appearance. Mild degenerative change is noted about the midfoot. A small plantar calcaneal spur is seen. An os peroneum is noted. Scattered vascular calcifications are noted. No significant soft tissue swelling is characterized on radiograph. IMPRESSION: 1. No osseous erosions seen to suggest osteomyelitis. No evidence of fracture or dislocation. 2. Scattered vascular calcifications seen. Electronically Signed   By: Roanna Raider M.D.   On: 10/29/2018 22:42   Dg Chest Port 1 View  Result Date: 11/03/2018 CLINICAL DATA:  dyspnea EXAM: PORTABLE CHEST 1 VIEW COMPARISON:  Chest radiograph 10/29/2018 FINDINGS: Stable enlarged cardiac silhouette. Small RIGHT effusion similar prior. Lungs are hyperinflated. No infiltrate or pneumothorax. Apical calcific scarring. IMPRESSION: 1. Hyperinflated lungs. 2. Persistent RIGHT pleural effusion. Electronically Signed   By: Genevive Bi M.D.   On: 11/03/2018 10:38   Dg Chest Port 1 View  Result Date: 10/29/2018 CLINICAL DATA:  Dyspnea EXAM: PORTABLE CHEST 1 VIEW COMPARISON:  07/01/2016 CXR FINDINGS: Emphysematous hyperinflation of the lungs with  stable cardiomegaly and aortic atherosclerosis. Scarring and atelectasis at the right lung base. Blunting of the right costophrenic angle may reflect a small effusion or pleural thickening. Osteoarthritis of the glenohumeral joint on the right with inferomedial spurring off the humeral head. Mild AC joint osteoarthritis is also  noted on the right. IMPRESSION: COPD with aortic atherosclerosis. Electronically Signed   By: Tollie Eth M.D.   On: 10/29/2018 18:47     Subjective: Seen and examined at bedside and was doing well.  Had no complaints and was alert and oriented to self.  No chest pain, lightheadedness or dizziness.  Rested well.  Nursing reports no acute overnight changes and patient has been deemed stable for discharge and she has been afebrile for last 24 to 48 hours.  Discharge Exam: Vitals:   11/03/18 0811 11/03/18 1415  BP: 140/80 132/83  Pulse: 88 100  Resp: 18 16  Temp:  98.4 F (36.9 C)  SpO2:  94%   Vitals:   11/02/18 2029 11/03/18 0632 11/03/18 0811 11/03/18 1415  BP: 140/79 (!) 150/87 140/80 132/83  Pulse: 87 88 88 100  Resp: 19 20 18 16   Temp: 98.9 F (37.2 C) 98.5 F (36.9 C)  98.4 F (36.9 C)  TempSrc: Oral Oral  Oral  SpO2: 93% 95%  94%  Weight:      Height:       General: Pt is alert, awake, not in acute distress Cardiovascular: Irregularly Irregular, S1/S2 +, no rubs, no gallops Respiratory: Diminished bilaterally, no wheezing, no rhonchi; Coarse breath sounds slightly  Abdominal: Soft, NT, ND, bowel sounds + Extremities: Bilateral Legs in boots to prevent offloading.   The results of significant diagnostics from this hospitalization (including imaging, microbiology, ancillary and laboratory) are listed below for reference.    Microbiology: Recent Results (from the past 240 hour(s))  Blood Culture (routine x 2)     Status: Abnormal   Collection Time: 10/29/18  6:23 PM  Result Value Ref Range Status   Specimen Description   Final    BLOOD RIGHT  HAND Performed at [facility], [address], [place], [place] [address]    Special Requests   Final    BOTTLES DRAWN AEROBIC AND ANAEROBIC Blood Culture adequate volume Performed at [facility], [address], [place], [place] [address]    Culture  Setup Time   Final    GRAM POSITIVE COCCI IN CHAINS IN BOTH AEROBIC AND ANAEROBIC BOTTLES CRITICAL RESULT CALLED TO, READ BACK BY AND VERIFIED WITH: PHARM A PHAM [phone] BY GF    Culture (A)  Final    GROUP A STREP (S.PYOGENES) ISOLATED HEALTH DEPARTMENT NOTIFIED FAXED 11/01/18 CTJ Performed at [facility], [address] [place], [place] [address]    Report Status 11/01/2018 FINAL  Final   Organism ID, Bacteria GROUP A STREP (S.PYOGENES) ISOLATED  Final      Susceptibility   Group a strep (s.pyogenes) isolated - MIC*    PENICILLIN <=0.06 SENSITIVE Sensitive     CEFTRIAXONE <=0.12 SENSITIVE Sensitive     ERYTHROMYCIN <=0.12 SENSITIVE Sensitive     LEVOFLOXACIN 0.5 SENSITIVE Sensitive     VANCOMYCIN <=0.12 SENSITIVE Sensitive     TIGECYCLINE <=0.06 SENSITIVE Sensitive     * GROUP A STREP (S.PYOGENES) ISOLATED  Urine culture     Status: None   Collection Time: 10/29/18  6:23 PM  Result Value Ref Range Status   Specimen Description   Final    URINE, RANDOM Performed at [facility], [address] [place], [place] [address]    Special Requests   Final    NONE Performed at [facility], [address] [place], [place] [address]    Culture  Final    NO GROWTH Performed at [facility], [address] [place], [place] [address]    Report Status 10/31/2018 FINAL  Final  Blood Culture ID Panel (Reflexed)     Status: Abnormal   Collection Time: 10/29/18  6:23 PM  Result Value Ref Range Status   Enterococcus species NOT DETECTED NOT DETECTED Final   Listeria monocytogenes NOT DETECTED NOT DETECTED Final   Staphylococcus species NOT  DETECTED NOT DETECTED Final   Staphylococcus aureus (BCID) NOT DETECTED NOT DETECTED Final   Streptococcus species DETECTED (A) NOT DETECTED Final    Comment: CRITICAL RESULT CALLED TO, READ BACK BY AND VERIFIED WITH: PHARMD A PHAM [phone] BY GF    Streptococcus agalactiae NOT DETECTED NOT DETECTED Final   Streptococcus pneumoniae NOT DETECTED NOT DETECTED Final   Streptococcus pyogenes DETECTED (A) NOT DETECTED Final    Comment: CRITICAL RESULT CALLED TO, READ BACK BY AND VERIFIED WITH: PHARMD A PHAM [phone] BY GF    Acinetobacter baumannii NOT DETECTED NOT DETECTED Final   Enterobacteriaceae species NOT DETECTED NOT DETECTED Final   Enterobacter cloacae complex NOT DETECTED NOT DETECTED Final   Escherichia coli NOT DETECTED NOT DETECTED Final   Klebsiella oxytoca NOT DETECTED NOT DETECTED Final   Klebsiella pneumoniae NOT DETECTED NOT DETECTED Final   Proteus species NOT DETECTED NOT DETECTED Final   Serratia marcescens NOT DETECTED NOT DETECTED Final   Haemophilus influenzae NOT DETECTED NOT DETECTED Final   Neisseria meningitidis NOT DETECTED NOT DETECTED Final   Pseudomonas aeruginosa NOT DETECTED NOT DETECTED Final   Candida albicans NOT DETECTED NOT DETECTED Final   Candida glabrata NOT DETECTED NOT DETECTED Final   Candida krusei NOT DETECTED NOT DETECTED Final   Candida parapsilosis NOT DETECTED NOT DETECTED Final   Candida tropicalis NOT DETECTED NOT DETECTED Final    Comment: Performed at [facility], [address], [place], [place] [address]  MRSA PCR Screening     Status: None   Collection Time: 10/29/18 10:58 PM  Result Value Ref Range Status   MRSA by PCR NEGATIVE NEGATIVE Final    Comment:        The GeneXpert MRSA Assay (FDA approved for NASAL specimens only), is one component of a comprehensive MRSA colonization surveillance program. It is not intended to diagnose MRSA infection nor to guide or monitor treatment for MRSA  infections. Performed at [facility], [address] [place], [place] [address]   Blood Culture (routine x 2)     Status: None (Preliminary result)   Collection Time: 10/30/18  3:08 AM  Result Value Ref Range Status   Specimen Description   Final    BLOOD BLOOD RIGHT HAND Performed at [facility], [address] [place], [place] [address]    Special Requests   Final    BOTTLES DRAWN AEROBIC ONLY Blood Culture adequate volume Performed at [facility], [address], [place], [place] [address]    Culture   Final    NO GROWTH 4 DAYS Performed at [facility], [address], [place], [place] [address]    Report Status PENDING  Incomplete    Labs: BNP (last 3 results) No results for input(s): BNP in the last 8760 hours. Basic Metabolic Panel: Recent Labs  Lab 10/30/18 0308 10/31/18 0349 11/01/18 0610 11/02/18 0600 11/03/18 0620  NA 140 140 139 141 139  K 4.1 4.0 4.0 4.0 3.7  CL 111 112* 110 113* 110  CO2 17* 18* 17* 18* 19*  GLUCOSE 114* 74 86 71 82  BUN 24* 26* 24* 19 17  CREATININE 0.76 0.90 0.87 0.71 0.66  CALCIUM 7.8* 7.9* 8.2* 8.3* 8.5*  MG  --  1.7  --   --   --    Liver Function Tests: Recent Labs  Lab 10/29/18 1823 10/30/18 0308 10/31/18 0349  AST 89* 64* 37  ALT 57* 48* 34  ALKPHOS 141* 107 98  BILITOT 1.0 1.4* 0.9  PROT 7.4 5.9* 6.6  ALBUMIN 3.4* 2.7* 2.9*   No results for input(s): LIPASE, AMYLASE in the last 168 hours. No results for input(s): AMMONIA in the last 168 hours. CBC: Recent Labs  Lab 10/29/18 1823 10/30/18 0308 10/31/18 0349 11/01/18 0610 11/02/18 0600 11/03/18 0620  WBC 20.1* 23.4* 13.7* 12.0* 10.6* 11.7*  NEUTROABS 18.5*  --   --   --   --   --   HGB 12.6 10.4* 11.7* 12.1 12.2 12.5  HCT 39.3 33.8* 36.8 38.4 39.0 39.5  MCV 98.7 101.2* 98.7 99.7 97.3 97.3  PLT 215 191 197 212 249 261   Cardiac Enzymes: No results for input(s): CKTOTAL, CKMB, CKMBINDEX,  TROPONINI in the last 168 hours. BNP: Invalid input(s): POCBNP CBG: No results for input(s): GLUCAP in the last 168 hours. D-Dimer No results for input(s): DDIMER in the last 72 hours. Hgb A1c No results for input(s): HGBA1C in the last 72 hours. Lipid Profile No results for input(s): CHOL, HDL, LDLCALC, TRIG, CHOLHDL, LDLDIRECT in the last 72 hours. Thyroid function studies No results for input(s): TSH, T4TOTAL, T3FREE, THYROIDAB in the last 72 hours.  Invalid input(s): FREET3 Anemia work up No results for input(s): VITAMINB12, FOLATE, FERRITIN, TIBC, IRON, RETICCTPCT in the last 72 hours. Urinalysis    Component Value Date/Time   COLORURINE YELLOW 10/29/2018 1823   APPEARANCEUR CLEAR 10/29/2018 1823   LABSPEC 1.015 10/29/2018 1823   PHURINE 5.0 10/29/2018 1823   GLUCOSEU NEGATIVE 10/29/2018 1823   HGBUR NEGATIVE 10/29/2018 1823   BILIRUBINUR NEGATIVE 10/29/2018 1823   KETONESUR NEGATIVE 10/29/2018 1823   PROTEINUR NEGATIVE 10/29/2018 1823   UROBILINOGEN 0.2 10/30/2014 2242   NITRITE NEGATIVE 10/29/2018 1823   LEUKOCYTESUR NEGATIVE 10/29/2018 1823   Sepsis Labs Invalid input(s): PROCALCITONIN,  WBC,  LACTICIDVEN Microbiology Recent Results (from the past 240 hour(s))  Blood Culture (routine x 2)     Status: Abnormal   Collection Time: 10/29/18  6:23 PM  Result Value Ref Range Status   Specimen Description   Final    BLOOD RIGHT HAND Performed at [facility], [address] [place], [place] [address]    Special Requests   Final    BOTTLES DRAWN AEROBIC AND ANAEROBIC Blood Culture adequate volume Performed at [facility], [address] [place], [place] [address]    Culture  Setup Time   Final    GRAM POSITIVE COCCI IN CHAINS IN BOTH AEROBIC AND ANAEROBIC BOTTLES CRITICAL RESULT CALLED TO, READ BACK BY AND VERIFIED WITH: PHARM A PHAM [phone] BY GF    Culture (A)  Final    GROUP A STREP (S.PYOGENES) ISOLATED HEALTH  DEPARTMENT NOTIFIED FAXED 11/01/18 CTJ Performed at [facility], [address], [place], [place] [address]    Report Status 11/01/2018 FINAL  Final   Organism ID, Bacteria GROUP A STREP (S.PYOGENES) ISOLATED  Final      Susceptibility   Group a  strep (s.pyogenes) isolated - MIC*    PENICILLIN <=0.06 SENSITIVE Sensitive     CEFTRIAXONE <=0.12 SENSITIVE Sensitive     ERYTHROMYCIN <=0.12 SENSITIVE Sensitive     LEVOFLOXACIN 0.5 SENSITIVE Sensitive     VANCOMYCIN <=0.12 SENSITIVE Sensitive     TIGECYCLINE <=0.06 SENSITIVE Sensitive     * GROUP A STREP (S.PYOGENES) ISOLATED  Urine culture     Status: None   Collection Time: 10/29/18  6:23 PM  Result Value Ref Range Status   Specimen Description   Final    URINE, RANDOM Performed at [facility], [address] [place], [place] [address]    Special Requests   Final    NONE Performed at [facility] [facility], [address], [place], [place] [address]    Culture   Final    NO GROWTH Performed at [facility], [address] [place], [place] [address]    Report Status 10/31/2018 FINAL  Final  Blood Culture ID Panel (Reflexed)     Status: Abnormal   Collection Time: 10/29/18  6:23 PM  Result Value Ref Range Status   Enterococcus species NOT DETECTED NOT DETECTED Final   Listeria monocytogenes NOT DETECTED NOT DETECTED Final   Staphylococcus species NOT DETECTED NOT DETECTED Final   Staphylococcus aureus (BCID) NOT DETECTED NOT DETECTED Final   Streptococcus species DETECTED (A) NOT DETECTED Final    Comment: CRITICAL RESULT CALLED TO, READ BACK BY AND VERIFIED WITH: PHARMD A PHAM [phone] BY GF    Streptococcus agalactiae NOT DETECTED NOT DETECTED Final   Streptococcus pneumoniae NOT DETECTED NOT DETECTED Final   Streptococcus pyogenes DETECTED (A) NOT DETECTED Final    Comment: CRITICAL RESULT CALLED TO, READ BACK BY AND VERIFIED WITH: PHARMD A PHAM [phone] BY GF     Acinetobacter baumannii NOT DETECTED NOT DETECTED Final   Enterobacteriaceae species NOT DETECTED NOT DETECTED Final   Enterobacter cloacae complex NOT DETECTED NOT DETECTED Final   Escherichia coli NOT DETECTED NOT DETECTED Final   Klebsiella oxytoca NOT DETECTED NOT DETECTED Final   Klebsiella pneumoniae NOT DETECTED NOT DETECTED Final   Proteus species NOT DETECTED NOT DETECTED Final   Serratia marcescens NOT DETECTED NOT DETECTED Final   Haemophilus influenzae NOT DETECTED NOT DETECTED Final   Neisseria meningitidis NOT DETECTED NOT DETECTED Final   Pseudomonas aeruginosa NOT DETECTED NOT DETECTED Final   Candida albicans NOT DETECTED NOT DETECTED Final   Candida glabrata NOT DETECTED NOT DETECTED Final   Candida krusei NOT DETECTED NOT DETECTED Final   Candida parapsilosis NOT DETECTED NOT DETECTED Final   Candida tropicalis NOT DETECTED NOT DETECTED Final    Comment: Performed at [facility], [address] [place], [place] [address]  MRSA PCR Screening     Status: None   Collection Time: 10/29/18 10:58 PM  Result Value Ref Range Status   MRSA by PCR NEGATIVE NEGATIVE Final    Comment:        The GeneXpert MRSA Assay (FDA approved for NASAL specimens only), is one component of a comprehensive MRSA colonization surveillance program. It is not intended to diagnose MRSA infection nor to guide or monitor treatment for MRSA infections. Performed at [facility], [address], [place], [place] [address]   Blood Culture (routine x 2)     Status: None (Preliminary result)   Collection Time: 10/30/18  3:08 AM  Result Value Ref Range Status   Specimen Description  Final    BLOOD BLOOD RIGHT HAND Performed at [facility], [address], [place], [place] [address]    Special Requests   Final    BOTTLES DRAWN AEROBIC ONLY Blood Culture adequate volume Performed at [facility], [address] [place],  [place] [address]    Culture   Final    NO GROWTH 4 DAYS Performed at [facility], [address] [place], [place] [address]    Report Status PENDING  Incomplete   Time coordinating discharge: 35 minutes  SIGNED:  Merlene Laughter, DO [facility] 11/03/2018, 3:16 PM Pager is on AMION  If 7PM-7AM, please contact night-coverage [email or web address] Password [email or web address]

## 2018-11-03 NOTE — Progress Notes (Signed)
RN called for report at 1640. RN who answered my phone call verbally stated that she did not want to get any report about patient who will go back to ALF at [facility] now until she got all paperworks from SW.

## 2018-11-03 NOTE — [place] FL2 (Addendum)
[place] MEDICAID FL2 LEVEL OF CARE SCREENING TOOL     IDENTIFICATION  Patient Name: Brittany Schultz Birthdate: 10/14/1922 Sex: female Admission Date (Current Location): 10/29/2018  [employer] and [place] Number:  Producer, television/film/video and Address:  [facility],  [address] [place], [place] [address]      Provider Number: [id number]  Attending Physician Name and Address:  Merlene Laughter, DO  Relative Name and Phone Number:  Celso Sickle [phone]     Current Level of Care: Hospital Recommended Level of Care: Assisted Living Prior Approval Number:    Date Approved/Denied:   PASRR Number:    Discharge Plan: Other (Comment)(Assisted Living)    Current Diagnoses: Patient Active Problem List   Diagnosis Date Noted  . Encephalopathy in sepsis 10/31/2018  . Sepsis (HCC) 10/29/2018  . Sepsis due to cellulitis (HCC) 10/29/2018  . Upper airway cough syndrome 07/01/2016  . Pleural effusion on right 07/01/2016  . Acute CHF (HCC) 10/31/2014  . Acute encephalopathy 10/31/2014  . Atrial fibrillation (HCC) 10/31/2014  . Subacute confusional state 10/30/2014  . Cellulitis 10/30/2014  . 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 Lymphedema 04/20/2014  . Debility 03/30/2014  . Shortness of breath 03/23/2014  . Deaf   . Varicose veins of lower extremities with ulcer (HCC)   . Peripheral vascular disease (HCC)   . Muscle weakness (generalized)   . Other malaise and fatigue   . Lack of coordination   . Unstable gait   . Hyperlipidemia   . Venous insufficiency of both lower extremities   . Open wound of left lower leg   . Hypertension   . Osteoarthritis of left hip   . Long term current use of anticoagulant therapy   . Thrombocytopenia (HCC) 03/15/2014  . Cellulitis and abscess of leg, except foot 02/25/2014  . Generalized weakness 02/21/2014  . Unspecified constipation 02/21/2014  . Dermatitis 02/21/2014  .  Unspecified hypothyroidism 02/21/2014  . Essential hypertension, benign 02/19/2014  . Congestive heart failure (HCC) 02/19/2014  . A-fib (HCC) 02/19/2014  . Macular rash 02/19/2014  . Osteoarthritis 02/19/2014  . Hypothyroidism 02/19/2014  . Venous stasis ulcer of left lower extremity (HCC) 02/19/2014    Orientation RESPIRATION BLADDER Height & Weight     Self  Normal Incontinent Weight: 120 lb (54.4 kg) Height:  5\' 4"  (162.6 cm)  BEHAVIORAL SYMPTOMS/MOOD NEUROLOGICAL BOWEL NUTRITION STATUS      Incontinent Diet  AMBULATORY STATUS COMMUNICATION OF NEEDS Skin   Extensive Assist Verbally                         Personal Care Assistance Level of Assistance  Bathing, Feeding, Dressing Bathing Assistance: Maximum assistance Feeding assistance: Limited assistance Dressing Assistance: Maximum assistance     Functional Limitations Info  Sight, Hearing, Speech Sight Info: Impaired Hearing Info: Impaired Speech Info: Adequate    SPECIAL CARE FACTORS FREQUENCY  PT (By licensed PT), OT (By licensed OT)     PT Frequency: Minimum 2x weekly OT Frequency: Minimum 2x weekly    Home Health RN, PT, OT        Contractures Contractures Info: Not present    Additional Factors Info  Code Status, Allergies Code Status Info: DNR Allergies Info: Sulfa Antibiotics, Codeine, Penicillins, Sulfonylureas, Clindamycin/lincomycin           Current Medications (11/03/2018):  This is the current hospital active medication list Current Facility-Administered Medications  Medication Dose Route Frequency Provider Last Rate Last Dose  . albuterol (PROVENTIL) (2.5 MG/3ML) 0.083% nebulizer solution 2.5 mg  2.5 mg Nebulization Q6H PRN Roberto Scales D, MD      . carvedilol (COREG) tablet 6.25 mg  6.25 mg Oral Daily Roberto Scales D, MD   6.25 mg at 11/03/18 0947  . cephALEXin (KEFLEX) capsule 500 mg  500 mg Oral Q6H Roberto Scales D, MD   500 mg at 11/03/18 1103  . diphenhydrAMINE (BENADRYL)  capsule 25 mg  25 mg Oral Q4H PRN Roberto Scales D, MD   25 mg at 11/01/18 0059  . diphenhydrAMINE (BENADRYL) injection 25 mg  25 mg Intravenous Once Roberto Scales D, MD      . guaiFENesin (MUCINEX) 12 hr tablet 1,200 mg  1,200 mg Oral BID Sheikh, Omair Ruth, DO   1,200 mg at 11/03/18 1103  . levothyroxine (SYNTHROID, LEVOTHROID) tablet 100 mcg  100 mcg Oral QHS Roberto Scales D, MD   100 mcg at 11/02/18 2220  . lip balm (CARMEX) ointment   Topical PRN Laverna Peace, MD   1 application at 10/31/18 2126  . Rivaroxaban (XARELTO) tablet 15 mg  15 mg Oral Q supper Roberto Scales D, MD   15 mg at 11/02/18 1634  . sertraline (ZOLOFT) tablet 75 mg  75 mg Oral Daily Roberto Scales D, MD   75 mg at 11/03/18 0947  . traMADol (ULTRAM) tablet 50 mg  50 mg Oral Q12H PRN Roberto Scales D, MD   50 mg at 11/03/18 0600     Discharge Medications: Medication List    STOP taking these medications   doxycycline 100 MG capsule Commonly known as:  VIBRAMYCIN   warfarin 2 MG tablet Commonly known as:  COUMADIN     TAKE these medications   acetaminophen 325 MG tablet Commonly known as:  TYLENOL Take 650 mg by mouth 3 (three) times daily.   carvedilol 6.25 MG tablet Commonly known as:  COREG Take 6.25 mg by mouth daily.   cephALEXin 500 MG capsule Commonly known as:  KEFLEX Take 1 capsule (500 mg total) by mouth every 6 (six) hours for 6 days.   DESITIN 13 % Crea Generic drug:  Zinc Oxide Apply 1 application topically 2 (two) times daily.   diclofenac sodium 1 % Gel Commonly known as:  VOLTAREN Apply 2 g topically 3 (three) times daily. Both lower extremities   furosemide 40 MG tablet Commonly known as:  LASIX Take 1 tablet (40 mg total) by mouth daily.   guaiFENesin 600 MG 12 hr tablet Commonly known as:  MUCINEX Take 1 tablet (600 mg total) by mouth 2 (two) times daily for 10 doses.   hydrALAZINE 25 MG tablet Commonly known as:  APRESOLINE Take 1 tablet (25 mg total) by  mouth every 8 (eight) hours. What changed:  when to take this   hydrOXYzine 25 MG tablet Commonly known as:  ATARAX/VISTARIL Take 1 tablet (25 mg total) by mouth every 8 (eight) hours as needed.   JUICE PLUS FIBRE PO Take 1 capsule by mouth 3 (three) times daily. Take one capsule of each three times a day [employer]   levothyroxine 100 MCG tablet Commonly known as:  SYNTHROID, LEVOTHROID Take 100 mcg by mouth at bedtime.   lip balm ointment Apply topically as needed for lip care.   mineral oil external liquid Place 2 drops into both ears once a week.   potassium chloride SA  20 MEQ tablet Commonly known as:  K-DUR,KLOR-CON Take 20 mEq by mouth daily.   rivaroxaban 20 MG Tabs tablet Commonly known as:  XARELTO Take 20 mg by mouth daily.   sertraline 50 MG tablet Commonly known as:  ZOLOFT Take 75 mg by mouth daily.   spironolactone 25 MG tablet Commonly known as:  ALDACTONE Take 1 tablet (25 mg total) by mouth daily.   traMADol 50 MG tablet Commonly known as:  ULTRAM Take 1 tablet (50 mg total) by mouth as directed. Take 1 tablet BID & Take 1 tablet daily PRN for Pain.   vitamin B-12 1000 MCG tablet Commonly known as:  CYANOCOBALAMIN Take 1,000 mcg by mouth daily.   VITAMIN D (ERGOCALCIFEROL) PO Take 1,000 Units by mouth daily.         Follow-up Information    Angela Cox, MD. Call.   Specialty:  Internal Medicine Why:  Follow up within 1 week  Contact information: [address] [place] [address] [phone]     Relevant Imaging Results:  Relevant Lab Results:   Additional Information SSN: [id number]  Darreld Mclean, [place]

## 2018-11-03 NOTE — Progress Notes (Signed)
Per Agustin Cree at [facility], she is not able to accept the patient back today. Darlene states the patient's FL2 is not co-signed, physician co-signed FL2 at 4:21pm. CSW faxed FL2 at 4:30pm.  CSW will cancel PTAR and notify granddaughter, Artelia Laroche, that facility is unable to accept patient today. CSW to update nursing staff as well.   Enid Cutter, LCSW-A Clinical Social Worker [phone]

## 2018-11-03 NOTE — Progress Notes (Signed)
Patient has discharged to [facility] on 11/03/18. SW is aware.

## 2018-11-03 NOTE — Discharge Summary (Signed)
Physician Discharge Summary  CHRISTINNA CHATTMAN JOI:[id number] DOB: 07/25/1922 DOA: 10/29/2018  PCP: Angela Cox, MD  Admit date: 10/29/2018 Discharge date: 11/03/2018  Admitted From: ALF Disposition:  ALF with Home Health Services  Recommendations for Outpatient Follow-up:  1. Follow up with PCP in 1-2 weeks 2. Follow up with Pulmonary as an outpatient for outpatient PFT's and formal evaluation for COPD 3. Follow up with Wound Care Clinic; [facility] works well for the patient  4. Please obtain CMP/CBC, Mag, Phos in one week 5. Repeat CXR with 3-6 weeks 6. Please follow up on the following pending results:  Home Health: Yes Equipment/Devices: None Recommended by PT  Discharge Condition: Stable CODE STATUS: DO NOT RESUSCITATE  Diet recommendation: SOFT Diet   Brief/Interim Summary: [name] is a 84 y.o. year old female with medical history significant for AF on anticoagulation, chronic venous stasis lower extremity ulcers, HTN, and other comorbidities who presented on 10/29/2018 with reports from her facility [facility]) with one day of of increased lethargy, less oriented (reportedly normallly alert and oriented x 4), worsening drainage from chronic lower extremity ulcers and fever and was found to have severe sepsis presumed secondary to cellulitis of chronic LE ulcers.  ED course: Patient was found to have fever of 102.1, with blood pressure 104/50 requiring fluid resuscitation with 1.7 L normal saline, blood cultures were obtained, lactic acid was 2.2.  Flu panel was negative CMP was unremarkable.  Initial white count was 20.1.  UA was unremarkable.  INR was 2.33  Workup revealed:  X-ray of left fibula with no acute abnormalities, x-ray of right fibula with no evidence of osteomyelitis X-rays of right and left ankles show no evidence of fractures Chest x-ray shows COPD otherwise no acute changes. Empirically started on aztreonam, Flagyl, vancomycin  She is  found to have a strep biology knees bacteremia and likely her source was her lower leg.  She was placed on IV cefazolin and has been changed to p.o. Keflex and she will be continued on p.o. Keflex for another 7 days.  She improved and she was deemed medically stable to be discharged.  She will need to follow-up with her primary care physician and have an outpatient referral to pulmonology for official PFTs.  She also need to see the wound clear clinic and per patient's niece [facility] has worked well for the patient in the past but will leave that up to the discretion of the wound care clinic.  Discharge Diagnoses:  Principal Problem:   Cellulitis Active Problems:   Essential hypertension, benign   A-fib (HCC)   Hypothyroidism   Venous stasis ulcer of left lower extremity (HCC)   Long term current use of anticoagulant therapy   Sepsis (HCC)   Sepsis due to cellulitis (HCC)   Encephalopathy in sepsis  Sepsis secondary to S. Pyogens bacteremia, suspected source lower leg cellulitis. -Hemodynamically stable and sepsis physiology resolved.   -Dr. Caleb Popp had a discussion with ID via phone curbside (Dr. Johny Sax) on 11/18 since blood cultures pansensitive completed last dose of IV cefazolin on 11/18 and transitioned to Keflex 500 mg every 6 hours for additional 7 days starting 11/02/17. -Monitored over 24 hours to ensure remains afebrile given patient has had multiple treatments for recurrent infections of the legs and because she remains afebrile anticipate discharge hopefully back to ALF PT recommends SNF. -WBC is 11.7 and stable for the last few days    Acute Toxic Encephalopathy -Improving. Alert to self and  place.   -Likely delirium with acute exacerbation in the setting of sepsis physiology infection.   -Continue to reorient and continue delirium precautions delirium precautions  HTN -At Goal -Resume home lasix and Spironolactone at D/C along with Home Hydralazine.  -Continue  home  coreg.   A Fibrillation -Stable. C/w Home Coreg and Carelto  Depression -Stable  -Continue home zoloft  Hypothyroidism -C/w Home Synthroid  Suspected COPD -Not Decompensated currently -C/w Albuterol 2.5 mg Neb q6hprn Wheezing and SOB -Outpatient PFT's with Pulmonary -CXR showed no Infiltrate by hyperinflated lungs -Added Guaifenesin  Pleural Effusion -Right Sided and Small -Resume home Diuretics and stop IVF  Non-Gap Metabolic Acidosis -Improving -CO2 is now 19 -Follow up as an outpatient and repeat CMP  Discharge Instructions  Discharge Instructions    Call MD for:  difficulty breathing, headache or visual disturbances   Complete by:  As directed    Call MD for:  extreme fatigue   Complete by:  As directed    Call MD for:  hives   Complete by:  As directed    Call MD for:  persistant dizziness or light-headedness   Complete by:  As directed    Call MD for:  persistant nausea and vomiting   Complete by:  As directed    Call MD for:  redness, tenderness, or signs of infection (pain, swelling, redness, odor or green/yellow discharge around incision site)   Complete by:  As directed    Call MD for:  severe uncontrolled pain   Complete by:  As directed    Call MD for:  temperature >100.4   Complete by:  As directed    Diet - low sodium heart healthy   Complete by:  As directed    Discharge instructions   Complete by:  As directed    You were cared for by a hospitalist during your hospital stay. If you have any questions about your discharge medications or the care you received while you were in the hospital after you are discharged, you can call the unit and ask to speak with the hospitalist on call if the hospitalist that took care of you is not available. Once you are discharged, your primary care physician will handle any further medical issues. Please note that NO REFILLS for any discharge medications will be authorized once you are discharged, as it is  imperative that you return to your primary care physician (or establish a relationship with a primary care physician if you do not have one) for your aftercare needs so that they can reassess your need for medications and monitor your lab values.  Follow up with PCP and Wound Clinic. Have PCP refer for outpatient official PFT's for COPD diagnosis. Take all medications as prescribed. If symptoms change or worsen please return to the ED for evaluation   Increase activity slowly   Complete by:  As directed      Allergies as of 11/03/2018      Reactions   Sulfa Antibiotics Rash   Rash w/ sulfonomides furosemide, meloxicam   Codeine    unknown   Penicillins    unknown   Sulfonylureas    unknown   Clindamycin/lincomycin Itching, Rash      Medication List    STOP taking these medications   doxycycline 100 MG capsule Commonly known as:  VIBRAMYCIN   warfarin 2 MG tablet Commonly known as:  COUMADIN     TAKE these medications   acetaminophen 325 MG tablet Commonly  known as:  TYLENOL Take 650 mg by mouth 3 (three) times daily.   carvedilol 6.25 MG tablet Commonly known as:  COREG Take 6.25 mg by mouth daily.   cephALEXin 500 MG capsule Commonly known as:  KEFLEX Take 1 capsule (500 mg total) by mouth every 6 (six) hours for 6 days.   DESITIN 13 % Crea Generic drug:  Zinc Oxide Apply 1 application topically 2 (two) times daily.   diclofenac sodium 1 % Gel Commonly known as:  VOLTAREN Apply 2 g topically 3 (three) times daily. Both lower extremities   furosemide 40 MG tablet Commonly known as:  LASIX Take 1 tablet (40 mg total) by mouth daily.   guaiFENesin 600 MG 12 hr tablet Commonly known as:  MUCINEX Take 1 tablet (600 mg total) by mouth 2 (two) times daily for 10 doses.   hydrALAZINE 25 MG tablet Commonly known as:  APRESOLINE Take 1 tablet (25 mg total) by mouth every 8 (eight) hours. What changed:  when to take this   hydrOXYzine 25 MG tablet Commonly known  as:  ATARAX/VISTARIL Take 1 tablet (25 mg total) by mouth every 8 (eight) hours as needed.   JUICE PLUS FIBRE PO Take 1 capsule by mouth 3 (three) times daily. Take one capsule of each three times a day [employer]   levothyroxine 100 MCG tablet Commonly known as:  SYNTHROID, LEVOTHROID Take 100 mcg by mouth at bedtime.   lip balm ointment Apply topically as needed for lip care.   mineral oil external liquid Place 2 drops into both ears once a week.   potassium chloride SA 20 MEQ tablet Commonly known as:  K-DUR,KLOR-CON Take 20 mEq by mouth daily.   rivaroxaban 20 MG Tabs tablet Commonly known as:  XARELTO Take 20 mg by mouth daily.   sertraline 50 MG tablet Commonly known as:  ZOLOFT Take 75 mg by mouth daily.   spironolactone 25 MG tablet Commonly known as:  ALDACTONE Take 1 tablet (25 mg total) by mouth daily.   traMADol 50 MG tablet Commonly known as:  ULTRAM Take 1 tablet (50 mg total) by mouth as directed. Take 1 tablet BID & Take 1 tablet daily PRN for Pain.   vitamin B-12 1000 MCG tablet Commonly known as:  CYANOCOBALAMIN Take 1,000 mcg by mouth daily.   VITAMIN D (ERGOCALCIFEROL) PO Take 1,000 Units by mouth daily.      Follow-up Information    Angela Cox, MD. Call.   Specialty:  Internal Medicine Why:  Follow up within 1 week  Contact information: [address] [place] [address] [phone]          Allergies  Allergen Reactions  . Sulfa Antibiotics Rash    Rash w/ sulfonomides furosemide, meloxicam  . Codeine     unknown  . Penicillins     unknown  . Sulfonylureas     unknown  . Clindamycin/Lincomycin Itching and Rash   Consultations:  Case was discussed with ID by my partner Dr. Caleb Popp  Procedures/Studies: Dg Tibia/fibula Left  Result Date: 10/29/2018 CLINICAL DATA:  Assess cellulitis.  Sepsis. EXAM: LEFT TIBIA AND FIBULA - 2 VIEW COMPARISON:  None. FINDINGS: No  osseous erosions are seen to suggest osteomyelitis. The tibia and fibula appear grossly intact. Minimal degenerative change is noted at the patellofemoral compartment. The visualized portions of the left knee are grossly unremarkable. The ankle mortise is not well characterized. Mild degenerative change is  suggested at the tibiotalar articulation. Scattered vascular calcifications are seen. IMPRESSION: 1. No osseous erosions seen to suggest osteomyelitis. Tibia and fibula appear grossly intact. 2. Scattered vascular calcifications seen. Electronically Signed   By: Roanna Raider M.D.   On: 10/29/2018 22:39   Dg Tibia/fibula Right  Result Date: 10/29/2018 CLINICAL DATA:  Assess cellulitis.  Sepsis. EXAM: RIGHT TIBIA AND FIBULA - 2 VIEW COMPARISON:  None. FINDINGS: No osseous erosions are seen to suggest osteomyelitis. Degenerative change is noted at the patellofemoral compartment, with joint space narrowing. An enthesophyte is noted at the upper pole of the patella. The tibia and fibula appear grossly intact. The ankle mortise is incompletely assessed, but appears grossly unremarkable. Soft tissue swelling is noted about the medial aspect of the knee and proximal lower leg. Scattered vascular calcifications are seen. IMPRESSION: 1. No osseous erosions seen to suggest osteomyelitis. 2. Scattered vascular calcifications seen. 3. Degenerative change at the patellofemoral compartment. Electronically Signed   By: Roanna Raider M.D.   On: 10/29/2018 22:41   Dg Ankle 2 Views Left  Result Date: 10/29/2018 CLINICAL DATA:  Assess cellulitis.  Sepsis. EXAM: LEFT ANKLE - 2 VIEW COMPARISON:  None. FINDINGS: There is no evidence of fracture or dislocation. No osseous erosions are definitely characterized to suggest osteomyelitis. However, there is diffuse osteopenia of visualized osseous structures, limiting evaluation. Soft tissue swelling is noted about the lower leg and ankle, with a dressing overlying the medial  aspect of the ankle. Diffuse vascular calcifications are seen. Mild degenerative change is noted at the talar dome. A plantar calcaneal spur is seen. An os peroneum is noted. IMPRESSION: 1. No evidence of fracture or dislocation. No definite evidence for osteomyelitis. However, diffuse osteopenia of visualized osseous structures limits evaluation for erosions. 2. Diffuse vascular calcifications seen. 3. Soft tissue swelling about the lower leg and ankle. Electronically Signed   By: Roanna Raider M.D.   On: 10/29/2018 22:43   Dg Ankle 2 Views Right  Result Date: 10/29/2018 CLINICAL DATA:  Assess cellulitis.  Sepsis. EXAM: RIGHT ANKLE - 2 VIEW COMPARISON:  None. FINDINGS: There is no evidence of fracture or dislocation. No osseous erosions are seen to suggest osteomyelitis. The ankle mortise is grossly unremarkable in appearance. Mild degenerative change is noted about the midfoot. A small plantar calcaneal spur is seen. An os peroneum is noted. Scattered vascular calcifications are noted. No significant soft tissue swelling is characterized on radiograph. IMPRESSION: 1. No osseous erosions seen to suggest osteomyelitis. No evidence of fracture or dislocation. 2. Scattered vascular calcifications seen. Electronically Signed   By: Roanna Raider M.D.   On: 10/29/2018 22:42   Dg Chest Port 1 View  Result Date: 11/03/2018 CLINICAL DATA:  dyspnea EXAM: PORTABLE CHEST 1 VIEW COMPARISON:  Chest radiograph 10/29/2018 FINDINGS: Stable enlarged cardiac silhouette. Small RIGHT effusion similar prior. Lungs are hyperinflated. No infiltrate or pneumothorax. Apical calcific scarring. IMPRESSION: 1. Hyperinflated lungs. 2. Persistent RIGHT pleural effusion. Electronically Signed   By: Genevive Bi M.D.   On: 11/03/2018 10:38   Dg Chest Port 1 View  Result Date: 10/29/2018 CLINICAL DATA:  Dyspnea EXAM: PORTABLE CHEST 1 VIEW COMPARISON:  07/01/2016 CXR FINDINGS: Emphysematous hyperinflation of the lungs with  stable cardiomegaly and aortic atherosclerosis. Scarring and atelectasis at the right lung base. Blunting of the right costophrenic angle may reflect a small effusion or pleural thickening. Osteoarthritis of the glenohumeral joint on the right with inferomedial spurring off the humeral head. Mild AC joint osteoarthritis is also  noted on the right. IMPRESSION: COPD with aortic atherosclerosis. Electronically Signed   By: Tollie Eth M.D.   On: 10/29/2018 18:47    Subjective: Seen and examined at bedside and was doing well.  Had no complaints and was alert and oriented to self.  No chest pain, lightheadedness or dizziness.  Rested well.  Nursing reports no acute overnight changes and patient has been deemed stable for discharge and she has been afebrile for last 24 to 48 hours.  Discharge Exam: Vitals:   11/03/18 0811 11/03/18 1415  BP: 140/80 132/83  Pulse: 88 100  Resp: 18 16  Temp:  98.4 F (36.9 C)  SpO2:  94%   Vitals:   11/02/18 2029 11/03/18 0632 11/03/18 0811 11/03/18 1415  BP: 140/79 (!) 150/87 140/80 132/83  Pulse: 87 88 88 100  Resp: 19 20 18 16   Temp: 98.9 F (37.2 C) 98.5 F (36.9 C)  98.4 F (36.9 C)  TempSrc: Oral Oral  Oral  SpO2: 93% 95%  94%  Weight:      Height:       General: Pt is alert, awake, not in acute distress Cardiovascular: Irregularly Irregular, S1/S2 +, no rubs, no gallops Respiratory: Diminished bilaterally, no wheezing, no rhonchi; Coarse breath sounds slightly  Abdominal: Soft, NT, ND, bowel sounds + Extremities: Bilateral Legs in boots to prevent offloading.   The results of significant diagnostics from this hospitalization (including imaging, microbiology, ancillary and laboratory) are listed below for reference.    Microbiology: Recent Results (from the past 240 hour(s))  Blood Culture (routine x 2)     Status: Abnormal   Collection Time: 10/29/18  6:23 PM  Result Value Ref Range Status   Specimen Description   Final    BLOOD RIGHT  HAND Performed at [facility], [address] [place], [place] [address]    Special Requests   Final    BOTTLES DRAWN AEROBIC AND ANAEROBIC Blood Culture adequate volume Performed at [facility], [address], [place], [place] [address]    Culture  Setup Time   Final    GRAM POSITIVE COCCI IN CHAINS IN BOTH AEROBIC AND ANAEROBIC BOTTLES CRITICAL RESULT CALLED TO, READ BACK BY AND VERIFIED WITH: PHARM A PHAM [phone] BY GF    Culture (A)  Final    GROUP A STREP (S.PYOGENES) ISOLATED HEALTH DEPARTMENT NOTIFIED FAXED 11/01/18 CTJ Performed at [facility], [address], [place], [place] [address]    Report Status 11/01/2018 FINAL  Final   Organism ID, Bacteria GROUP A STREP (S.PYOGENES) ISOLATED  Final      Susceptibility   Group a strep (s.pyogenes) isolated - MIC*    PENICILLIN <=0.06 SENSITIVE Sensitive     CEFTRIAXONE <=0.12 SENSITIVE Sensitive     ERYTHROMYCIN <=0.12 SENSITIVE Sensitive     LEVOFLOXACIN 0.5 SENSITIVE Sensitive     VANCOMYCIN <=0.12 SENSITIVE Sensitive     TIGECYCLINE <=0.06 SENSITIVE Sensitive     * GROUP A STREP (S.PYOGENES) ISOLATED  Urine culture     Status: None   Collection Time: 10/29/18  6:23 PM  Result Value Ref Range Status   Specimen Description   Final    URINE, RANDOM Performed at [facility], [address], [place], [place] [address]    Special Requests   Final    NONE Performed at [facility], [address] [place], [place] [address]    Culture   Final  NO GROWTH Performed at [facility], [address] [place], [place] [address]    Report Status 10/31/2018 FINAL  Final  Blood Culture ID Panel (Reflexed)     Status: Abnormal   Collection Time: 10/29/18  6:23 PM  Result Value Ref Range Status   Enterococcus species NOT DETECTED NOT DETECTED Final   Listeria monocytogenes NOT DETECTED NOT DETECTED Final   Staphylococcus species NOT  DETECTED NOT DETECTED Final   Staphylococcus aureus (BCID) NOT DETECTED NOT DETECTED Final   Streptococcus species DETECTED (A) NOT DETECTED Final    Comment: CRITICAL RESULT CALLED TO, READ BACK BY AND VERIFIED WITH: PHARMD A PHAM [phone] BY GF    Streptococcus agalactiae NOT DETECTED NOT DETECTED Final   Streptococcus pneumoniae NOT DETECTED NOT DETECTED Final   Streptococcus pyogenes DETECTED (A) NOT DETECTED Final    Comment: CRITICAL RESULT CALLED TO, READ BACK BY AND VERIFIED WITH: PHARMD A PHAM [phone] BY GF    Acinetobacter baumannii NOT DETECTED NOT DETECTED Final   Enterobacteriaceae species NOT DETECTED NOT DETECTED Final   Enterobacter cloacae complex NOT DETECTED NOT DETECTED Final   Escherichia coli NOT DETECTED NOT DETECTED Final   Klebsiella oxytoca NOT DETECTED NOT DETECTED Final   Klebsiella pneumoniae NOT DETECTED NOT DETECTED Final   Proteus species NOT DETECTED NOT DETECTED Final   Serratia marcescens NOT DETECTED NOT DETECTED Final   Haemophilus influenzae NOT DETECTED NOT DETECTED Final   Neisseria meningitidis NOT DETECTED NOT DETECTED Final   Pseudomonas aeruginosa NOT DETECTED NOT DETECTED Final   Candida albicans NOT DETECTED NOT DETECTED Final   Candida glabrata NOT DETECTED NOT DETECTED Final   Candida krusei NOT DETECTED NOT DETECTED Final   Candida parapsilosis NOT DETECTED NOT DETECTED Final   Candida tropicalis NOT DETECTED NOT DETECTED Final    Comment: Performed at [facility], [address], [place], [place] [address]  MRSA PCR Screening     Status: None   Collection Time: 10/29/18 10:58 PM  Result Value Ref Range Status   MRSA by PCR NEGATIVE NEGATIVE Final    Comment:        The GeneXpert MRSA Assay (FDA approved for NASAL specimens only), is one component of a comprehensive MRSA colonization surveillance program. It is not intended to diagnose MRSA infection nor to guide or monitor treatment for MRSA  infections. Performed at [facility], [address] [place], [place] [address]   Blood Culture (routine x 2)     Status: None (Preliminary result)   Collection Time: 10/30/18  3:08 AM  Result Value Ref Range Status   Specimen Description   Final    BLOOD BLOOD RIGHT HAND Performed at [facility], [address] [place], [place] [address]    Special Requests   Final    BOTTLES DRAWN AEROBIC ONLY Blood Culture adequate volume Performed at [facility], [address], [place], [place] [address]    Culture   Final    NO GROWTH 4 DAYS Performed at [facility], [address] [place], [place] [address]    Report Status PENDING  Incomplete    Labs: BNP (last 3 results) No results for input(s): BNP in the last 8760 hours. Basic Metabolic Panel: Recent Labs  Lab 10/30/18 0308 10/31/18 0349 11/01/18 0610 11/02/18 0600 11/03/18 0620  NA 140 140 139 141 139  K 4.1 4.0 4.0 4.0 3.7  CL 111 112* 110  113* 110  CO2 17* 18* 17* 18* 19*  GLUCOSE 114* 74 86 71 82  BUN 24* 26* 24* 19 17  CREATININE 0.76 0.90 0.87 0.71 0.66  CALCIUM 7.8* 7.9* 8.2* 8.3* 8.5*  MG  --  1.7  --   --   --    Liver Function Tests: Recent Labs  Lab 10/29/18 1823 10/30/18 0308 10/31/18 0349  AST 89* 64* 37  ALT 57* 48* 34  ALKPHOS 141* 107 98  BILITOT 1.0 1.4* 0.9  PROT 7.4 5.9* 6.6  ALBUMIN 3.4* 2.7* 2.9*   No results for input(s): LIPASE, AMYLASE in the last 168 hours. No results for input(s): AMMONIA in the last 168 hours. CBC: Recent Labs  Lab 10/29/18 1823 10/30/18 0308 10/31/18 0349 11/01/18 0610 11/02/18 0600 11/03/18 0620  WBC 20.1* 23.4* 13.7* 12.0* 10.6* 11.7*  NEUTROABS 18.5*  --   --   --   --   --   HGB 12.6 10.4* 11.7* 12.1 12.2 12.5  HCT 39.3 33.8* 36.8 38.4 39.0 39.5  MCV 98.7 101.2* 98.7 99.7 97.3 97.3  PLT 215 191 197 212 249 261   Cardiac Enzymes: No results for input(s): CKTOTAL, CKMB, CKMBINDEX,  TROPONINI in the last 168 hours. BNP: Invalid input(s): POCBNP CBG: No results for input(s): GLUCAP in the last 168 hours. D-Dimer No results for input(s): DDIMER in the last 72 hours. Hgb A1c No results for input(s): HGBA1C in the last 72 hours. Lipid Profile No results for input(s): CHOL, HDL, LDLCALC, TRIG, CHOLHDL, LDLDIRECT in the last 72 hours. Thyroid function studies No results for input(s): TSH, T4TOTAL, T3FREE, THYROIDAB in the last 72 hours.  Invalid input(s): FREET3 Anemia work up No results for input(s): VITAMINB12, FOLATE, FERRITIN, TIBC, IRON, RETICCTPCT in the last 72 hours. Urinalysis    Component Value Date/Time   COLORURINE YELLOW 10/29/2018 1823   APPEARANCEUR CLEAR 10/29/2018 1823   LABSPEC 1.015 10/29/2018 1823   PHURINE 5.0 10/29/2018 1823   GLUCOSEU NEGATIVE 10/29/2018 1823   HGBUR NEGATIVE 10/29/2018 1823   BILIRUBINUR NEGATIVE 10/29/2018 1823   KETONESUR NEGATIVE 10/29/2018 1823   PROTEINUR NEGATIVE 10/29/2018 1823   UROBILINOGEN 0.2 10/30/2014 2242   NITRITE NEGATIVE 10/29/2018 1823   LEUKOCYTESUR NEGATIVE 10/29/2018 1823   Sepsis Labs Invalid input(s): PROCALCITONIN,  WBC,  LACTICIDVEN Microbiology Recent Results (from the past 240 hour(s))  Blood Culture (routine x 2)     Status: Abnormal   Collection Time: 10/29/18  6:23 PM  Result Value Ref Range Status   Specimen Description   Final    BLOOD RIGHT HAND Performed at [facility], [address] [place], [place] [address]    Special Requests   Final    BOTTLES DRAWN AEROBIC AND ANAEROBIC Blood Culture adequate volume Performed at [facility], [address], [place], [place] [address]    Culture  Setup Time   Final    GRAM POSITIVE COCCI IN CHAINS IN BOTH AEROBIC AND ANAEROBIC BOTTLES CRITICAL RESULT CALLED TO, READ BACK BY AND VERIFIED WITH: PHARM A PHAM [phone] BY GF    Culture (A)  Final    GROUP A STREP (S.PYOGENES) ISOLATED HEALTH  DEPARTMENT NOTIFIED FAXED 11/01/18 CTJ Performed at [facility], [address], [place], [place] [address]    Report Status 11/01/2018 FINAL  Final   Organism ID, Bacteria GROUP A STREP (S.PYOGENES) ISOLATED  Final      Susceptibility   Group a strep (s.pyogenes) isolated -  MIC*    PENICILLIN <=0.06 SENSITIVE Sensitive     CEFTRIAXONE <=0.12 SENSITIVE Sensitive     ERYTHROMYCIN <=0.12 SENSITIVE Sensitive     LEVOFLOXACIN 0.5 SENSITIVE Sensitive     VANCOMYCIN <=0.12 SENSITIVE Sensitive     TIGECYCLINE <=0.06 SENSITIVE Sensitive     * GROUP A STREP (S.PYOGENES) ISOLATED  Urine culture     Status: None   Collection Time: 10/29/18  6:23 PM  Result Value Ref Range Status   Specimen Description   Final    URINE, RANDOM Performed at [facility], [address], [place], [place] [address]    Special Requests   Final    NONE Performed at [facility] [address], [place], [place] [address]    Culture   Final    NO GROWTH Performed at [facility], [address] [place], [place] [address]    Report Status 10/31/2018 FINAL  Final  Blood Culture ID Panel (Reflexed)     Status: Abnormal   Collection Time: 10/29/18  6:23 PM  Result Value Ref Range Status   Enterococcus species NOT DETECTED NOT DETECTED Final   Listeria monocytogenes NOT DETECTED NOT DETECTED Final   Staphylococcus species NOT DETECTED NOT DETECTED Final   Staphylococcus aureus (BCID) NOT DETECTED NOT DETECTED Final   Streptococcus species DETECTED (A) NOT DETECTED Final    Comment: CRITICAL RESULT CALLED TO, READ BACK BY AND VERIFIED WITH: PHARMD A PHAM [phone] BY GF    Streptococcus agalactiae NOT DETECTED NOT DETECTED Final   Streptococcus pneumoniae NOT DETECTED NOT DETECTED Final   Streptococcus pyogenes DETECTED (A) NOT DETECTED Final    Comment: CRITICAL RESULT CALLED TO, READ BACK BY AND VERIFIED WITH: PHARMD A PHAM [phone] BY GF     Acinetobacter baumannii NOT DETECTED NOT DETECTED Final   Enterobacteriaceae species NOT DETECTED NOT DETECTED Final   Enterobacter cloacae complex NOT DETECTED NOT DETECTED Final   Escherichia coli NOT DETECTED NOT DETECTED Final   Klebsiella oxytoca NOT DETECTED NOT DETECTED Final   Klebsiella pneumoniae NOT DETECTED NOT DETECTED Final   Proteus species NOT DETECTED NOT DETECTED Final   Serratia marcescens NOT DETECTED NOT DETECTED Final   Haemophilus influenzae NOT DETECTED NOT DETECTED Final   Neisseria meningitidis NOT DETECTED NOT DETECTED Final   Pseudomonas aeruginosa NOT DETECTED NOT DETECTED Final   Candida albicans NOT DETECTED NOT DETECTED Final   Candida glabrata NOT DETECTED NOT DETECTED Final   Candida krusei NOT DETECTED NOT DETECTED Final   Candida parapsilosis NOT DETECTED NOT DETECTED Final   Candida tropicalis NOT DETECTED NOT DETECTED Final    Comment: Performed at [facility], [address], [place], [place] [address]  MRSA PCR Screening     Status: None   Collection Time: 10/29/18 10:58 PM  Result Value Ref Range Status   MRSA by PCR NEGATIVE NEGATIVE Final    Comment:        The GeneXpert MRSA Assay (FDA approved for NASAL specimens only), is one component of a comprehensive MRSA colonization surveillance program. It is not intended to diagnose MRSA infection nor to guide or monitor treatment for MRSA infections. Performed at [facility], [address], [place], [place] [address]   Blood Culture (routine x 2)     Status: None (Preliminary result)   Collection Time: 10/30/18  3:08 AM  Result Value Ref Range Status   Specimen Description   Final  BLOOD BLOOD RIGHT HAND Performed at [facility], [address], [place], [place] [address]    Special Requests   Final    BOTTLES DRAWN AEROBIC ONLY Blood Culture adequate volume Performed at [facility], [address] [place],  [place] [address]    Culture   Final    NO GROWTH 4 DAYS Performed at [facility], [address], [place], [place] [address]    Report Status PENDING  Incomplete   Time coordinating discharge: 35 minutes  SIGNED:  Merlene Laughter, DO [facility] 11/03/2018, 7:08 PM Pager is on AMION  If 7PM-7AM, please contact night-coverage [email or web address] Password [email or web address]

## 2018-11-03 NOTE — Progress Notes (Addendum)
Patient is from [facility]. CSW aware patient expected to discharge today. [facility] aware of plan.  CSW will fax completed discharge summary and co-signed FL2 to facility prior to discharge. Fax to Caitlyn at [phone]  Patient will need PTAR. PTAR CALLED.  Signed prescriptions and DNR in chart.  RN CALL FOR REPORT: [phone]   Enid Cutter, LCSW-A Clinical Social Worker [phone]

## 2018-11-03 NOTE — Care Management Note (Addendum)
Case Management Note  Patient Details  Name: ABIGAYLE WILINSKI MRN: [id number] Date of Birth: 1922/02/05  Subjective/Objective:                  discharged  Action/Plan DCD to [facility] will use [facility] for rn, pt, ot,and aide   Expected Discharge Date:  11/03/18               Expected Discharge Plan:  Assisted Living / Rest Home  In-House Referral:  Clinical Social Work  Discharge planning Services  CM Consult  Post Acute Care Choice:  Home Health Choice offered to:  NA  DME Arranged:    DME Agency:     HH Arranged:  PT, OT, Nurse's Aide HH Agency:     Status of Service:  Completed, signed off  If discussed at [employer] of Stay Meetings, dates discussed:    Additional Comments:  Golda Acre, RN 11/03/2018, 3:40 PM

## 2018-11-04 LAB — CREATININE, SERUM: Creatinine, Ser: 0.55 mg/dL (ref 0.44–1.00)

## 2018-11-04 LAB — CULTURE, BLOOD (ROUTINE X 2)
CULTURE: NO GROWTH
SPECIAL REQUESTS: ADEQUATE

## 2018-11-04 MED ORDER — PREDNISONE 10 MG (21) PO TBPK: ORAL_TABLET | ORAL | 0 refills | Status: DC

## 2018-11-04 MED ORDER — ALBUTEROL SULFATE (2.5 MG/3ML) 0.083% IN NEBU: 2.5000 mg | INHALATION_SOLUTION | Freq: Four times a day (QID) | RESPIRATORY_TRACT | 12 refills | Status: DC | PRN

## 2018-11-04 NOTE — Care Management Note (Signed)
Case Management Note  Patient Details  Name: Brittany Schultz MRN: [id number] Date of Birth: [birth year]  Subjective/Objective:                  discharge  Action/Plan: home with hhc  Expected Discharge Date:  11/03/18               Expected Discharge Plan:  Home w Home Health Services  In-House Referral:  Clinical Social Work  Discharge planning Services  CM Consult  Post Acute Care Choice:  Home Health Choice offered to:  NA  DME Arranged:    DME Agency:     HH Arranged:  PT, OT, Nurse's Aide, RN HH Agency:  [facility] (now [facility])  Status of Service:  Completed, signed off  If discussed at [employer] of Stay Meetings, dates discussed:    Additional Comments:  Golda Acre, RN 11/04/2018, 9:57 AM

## 2018-11-04 NOTE — Discharge Summary (Addendum)
Physician Discharge Summary  Brittany Schultz:[id number] DOB: [birth year] DOA: 10/29/2018  PCP: Angela Cox, MD  Admit date: 10/29/2018 Discharge date: 11/04/2018  Admitted From: ALF Disposition:  ALF with Home Health Services  Recommendations for Outpatient Follow-up:  1. Follow up with PCP in 1-2 weeks 2. Follow up with Pulmonary as an outpatient for outpatient PFT's and formal evaluation for COPD 3. Follow up with Wound Care Clinic; [facility] works well for the patient  4. Please obtain CMP/CBC, Mag, Phos in one week 5. Repeat CXR with 3-6 weeks 6. Please follow up on the following pending results:  Home Health: Yes Equipment/Devices: None Recommended by PT  Discharge Condition: Stable CODE STATUS: DO NOT RESUSCITATE  Diet recommendation: SOFT Diet    ADDENDUM: Patient was deemed medically stable to be discharged yesterday but unfortunately could not leave due do paperwork. There have been no acute events overnight.  She is still stable for discharge today and recommending outpatient follow-up with pulmonology for official PFTs formal COPD evaluation.  Patient does sound a little rhonchorous and more wheezy today but she is stable for discharge as she is not hypoxic and not complaining of any dyspnea.She as not spiked a temperature. She will be sent out on Albuterol Nebs and a Steroid Taper. She will need to follow-up PCP, Pulmonology, and wound care clinic in outpatient setting.  Brief/Interim Summary: Brittany Schultz is a 84 y.o. year old female with medical history significant for AF on anticoagulation, chronic venous stasis lower extremity ulcers, HTN, and other comorbidities who presented on 10/29/2018 with reports from her facility [facility]) with one day of of increased lethargy, less oriented (reportedly normallly alert and oriented x 4), worsening drainage from chronic lower extremity ulcers and fever and was found to have severe sepsis presumed secondary to  cellulitis of chronic LE ulcers.  ED course: Patient was found to have fever of 102.1, with blood pressure 104/50 requiring fluid resuscitation with 1.7 L normal saline, blood cultures were obtained, lactic acid was 2.2.  Flu panel was negative CMP was unremarkable.  Initial white count was 20.1.  UA was unremarkable.  INR was 2.33  Workup revealed:  X-ray of left fibula with no acute abnormalities, x-ray of right fibula with no evidence of osteomyelitis X-rays of right and left ankles show no evidence of fractures Chest x-ray shows COPD otherwise no acute changes. Empirically started on aztreonam, Flagyl, vancomycin  She is found to have a strep biology knees bacteremia and likely her source was her lower leg.  She was placed on IV cefazolin and has been changed to p.o. Keflex and she will be continued on p.o. Keflex for another 7 days.  She improved and she was deemed medically stable to be discharged.  She will need to follow-up with her primary care physician and have an outpatient referral to pulmonology for official PFTs.  She also need to see the wound clear clinic and per patient's niece [facility] has worked well for the patient in the past but will leave that up to the discretion of the wound care clinic.  Discharge Diagnoses:  Principal Problem:   Cellulitis Active Problems:   Essential hypertension, benign   A-fib (HCC)   Hypothyroidism   Venous stasis ulcer of left lower extremity (HCC)   Long term current use of anticoagulant therapy   Sepsis (HCC)   Sepsis due to cellulitis (HCC)   Encephalopathy in sepsis  Sepsis secondary to S. Pyogens bacteremia, suspected source lower leg  cellulitis. -Hemodynamically stable and sepsis physiology resolved.   -Dr. Caleb Popp had a discussion with ID via phone curbside (Dr. Johny Sax) on 11/18 since blood cultures pansensitive completed last dose of IV cefazolin on 11/18 and transitioned to Keflex 500 mg every 6 hours for additional 7  days starting 11/02/17. -Monitored over 24 hours to ensure remains afebrile given patient has had multiple treatments for recurrent infections of the legs and because she remains afebrile anticipate discharge hopefully back to ALF PT recommends SNF. -WBC is 11.7 and stable for the last few days    Acute Toxic Encephalopathy -Improving. Alert to self and place.   -Likely delirium with acute exacerbation in the setting of sepsis physiology infection.   -Continue to reorient and continue delirium precautions delirium precautions  HTN -At Goal -Resume home lasix and Spironolactone at D/C along with Home Hydralazine.  -Continue  home coreg.   A Fibrillation -Stable. C/w Home Coreg and Carelto  Depression -Stable  -Continue home zoloft  Hypothyroidism -C/w Home Synthroid  Suspected COPD -Mildly Decompensated currently but not hypoxic -C/w Albuterol 2.5 mg Neb q6hprn Wheezing and SOB -Started Prednisone Taper with Sterapred -Already on Abx -Outpatient PFT's with Pulmonary -CXR 11/03/18 showed Stable enlarged cardiac silhouette. Small RIGHT effusion similar prior. Lungs are hyperinflated. No infiltrate or pneumothorax. Apical calcific scarring. -Added Guaifenesin and will continue at D/C -C/w Flutter Valve and Incentive Spirometry -Patient has no SOB but does have some wheezing bilaterally; Will need Repeat CXR in 3-6 Weeks  Pleural Effusion -Right Sided and Small -Resume home Diuretics and stop IVF -Repeat CXR in 3-6 weeks   Non-Gap Metabolic Acidosis -Improving -CO2 is now 19 -Follow up as an outpatient and repeat CMP  Discharge Instructions  Discharge Instructions    Call MD for:  difficulty breathing, headache or visual disturbances   Complete by:  As directed    Call MD for:  extreme fatigue   Complete by:  As directed    Call MD for:  hives   Complete by:  As directed    Call MD for:  persistant dizziness or light-headedness   Complete by:  As directed     Call MD for:  persistant nausea and vomiting   Complete by:  As directed    Call MD for:  redness, tenderness, or signs of infection (pain, swelling, redness, odor or green/yellow discharge around incision site)   Complete by:  As directed    Call MD for:  severe uncontrolled pain   Complete by:  As directed    Call MD for:  temperature >100.4   Complete by:  As directed    Diet - low sodium heart healthy   Complete by:  As directed    Discharge instructions   Complete by:  As directed    You were cared for by a hospitalist during your hospital stay. If you have any questions about your discharge medications or the care you received while you were in the hospital after you are discharged, you can call the unit and ask to speak with the hospitalist on call if the hospitalist that took care of you is not available. Once you are discharged, your primary care physician will handle any further medical issues. Please note that NO REFILLS for any discharge medications will be authorized once you are discharged, as it is imperative that you return to your primary care physician (or establish a relationship with a primary care physician if you do not have one) for your  aftercare needs so that they can reassess your need for medications and monitor your lab values.  Follow up with PCP and Wound Clinic. Have PCP refer for outpatient official PFT's for COPD diagnosis. Take all medications as prescribed. If symptoms change or worsen please return to the ED for evaluation   Increase activity slowly   Complete by:  As directed      Allergies as of 11/04/2018      Reactions   Sulfa Antibiotics Rash   Rash w/ sulfonomides furosemide, meloxicam   Codeine    unknown   Penicillins    unknown   Sulfonylureas    unknown   Clindamycin/lincomycin Itching, Rash      Medication List    STOP taking these medications   doxycycline 100 MG capsule Commonly known as:  VIBRAMYCIN   warfarin 2 MG  tablet Commonly known as:  COUMADIN     TAKE these medications   acetaminophen 325 MG tablet Commonly known as:  TYLENOL Take 650 mg by mouth 3 (three) times daily.   albuterol (2.5 MG/3ML) 0.083% nebulizer solution Commonly known as:  PROVENTIL Take 3 mLs (2.5 mg total) by nebulization every 6 (six) hours as needed for wheezing or shortness of breath.   carvedilol 6.25 MG tablet Commonly known as:  COREG Take 6.25 mg by mouth daily.   cephALEXin 500 MG capsule Commonly known as:  KEFLEX Take 1 capsule (500 mg total) by mouth every 6 (six) hours for 6 days.   DESITIN 13 % Crea Generic drug:  Zinc Oxide Apply 1 application topically 2 (two) times daily.   diclofenac sodium 1 % Gel Commonly known as:  VOLTAREN Apply 2 g topically 3 (three) times daily. Both lower extremities   furosemide 40 MG tablet Commonly known as:  LASIX Take 1 tablet (40 mg total) by mouth daily.   guaiFENesin 600 MG 12 hr tablet Commonly known as:  MUCINEX Take 1 tablet (600 mg total) by mouth 2 (two) times daily for 10 doses.   hydrALAZINE 25 MG tablet Commonly known as:  APRESOLINE Take 1 tablet (25 mg total) by mouth every 8 (eight) hours. What changed:  when to take this   hydrOXYzine 25 MG tablet Commonly known as:  ATARAX/VISTARIL Take 1 tablet (25 mg total) by mouth every 8 (eight) hours as needed.   JUICE PLUS FIBRE PO Take 1 capsule by mouth 3 (three) times daily. Take one capsule of each three times a day [employer]   levothyroxine 100 MCG tablet Commonly known as:  SYNTHROID, LEVOTHROID Take 100 mcg by mouth at bedtime.   lip balm ointment Apply topically as needed for lip care.   mineral oil external liquid Place 2 drops into both ears once a week.   potassium chloride SA 20 MEQ tablet Commonly known as:  K-DUR,KLOR-CON Take 20 mEq by mouth daily.   predniSONE 10 MG (21) Tbpk tablet Commonly known as:  STERAPRED UNI-PAK 21 TAB Take 6  tablets on day 1, 5 tablets on day 2, 4 tablets on day 3, 3 tablets on day 4, 2 tablets on day 5, 1 tablet day 6 and stop on day 7   rivaroxaban 20 MG Tabs tablet Commonly known as:  XARELTO Take 20 mg by mouth daily.   sertraline 50 MG tablet Commonly known as:  ZOLOFT Take 75 mg by mouth daily.   spironolactone 25 MG tablet Commonly known as:  ALDACTONE Take 1 tablet (25 mg total)  by mouth daily.   traMADol 50 MG tablet Commonly known as:  ULTRAM Take 1 tablet (50 mg total) by mouth as directed. Take 1 tablet BID & Take 1 tablet daily PRN for Pain.   vitamin B-12 1000 MCG tablet Commonly known as:  CYANOCOBALAMIN Take 1,000 mcg by mouth daily.   VITAMIN D (ERGOCALCIFEROL) PO Take 1,000 Units by mouth daily.      Follow-up Information    Angela Cox, MD. Call.   Specialty:  Internal Medicine Why:  Follow up within 1 week  Contact information: [address] [place] [address] [phone]          Allergies  Allergen Reactions  . Sulfa Antibiotics Rash    Rash w/ sulfonomides furosemide, meloxicam  . Codeine     unknown  . Penicillins     unknown  . Sulfonylureas     unknown  . Clindamycin/Lincomycin Itching and Rash   Consultations:  Case was discussed with ID by my partner Dr. Caleb Popp  Procedures/Studies: Dg Tibia/fibula Left  Result Date: 10/29/2018 CLINICAL DATA:  Assess cellulitis.  Sepsis. EXAM: LEFT TIBIA AND FIBULA - 2 VIEW COMPARISON:  None. FINDINGS: No osseous erosions are seen to suggest osteomyelitis. The tibia and fibula appear grossly intact. Minimal degenerative change is noted at the patellofemoral compartment. The visualized portions of the left knee are grossly unremarkable. The ankle mortise is not well characterized. Mild degenerative change is suggested at the tibiotalar articulation. Scattered vascular calcifications are seen. IMPRESSION: 1. No osseous erosions seen to suggest osteomyelitis. Tibia and fibula  appear grossly intact. 2. Scattered vascular calcifications seen. Electronically Signed   By: Roanna Raider M.D.   On: 10/29/2018 22:39   Dg Tibia/fibula Right  Result Date: 10/29/2018 CLINICAL DATA:  Assess cellulitis.  Sepsis. EXAM: RIGHT TIBIA AND FIBULA - 2 VIEW COMPARISON:  None. FINDINGS: No osseous erosions are seen to suggest osteomyelitis. Degenerative change is noted at the patellofemoral compartment, with joint space narrowing. An enthesophyte is noted at the upper pole of the patella. The tibia and fibula appear grossly intact. The ankle mortise is incompletely assessed, but appears grossly unremarkable. Soft tissue swelling is noted about the medial aspect of the knee and proximal lower leg. Scattered vascular calcifications are seen. IMPRESSION: 1. No osseous erosions seen to suggest osteomyelitis. 2. Scattered vascular calcifications seen. 3. Degenerative change at the patellofemoral compartment. Electronically Signed   By: Roanna Raider M.D.   On: 10/29/2018 22:41   Dg Ankle 2 Views Left  Result Date: 10/29/2018 CLINICAL DATA:  Assess cellulitis.  Sepsis. EXAM: LEFT ANKLE - 2 VIEW COMPARISON:  None. FINDINGS: There is no evidence of fracture or dislocation. No osseous erosions are definitely characterized to suggest osteomyelitis. However, there is diffuse osteopenia of visualized osseous structures, limiting evaluation. Soft tissue swelling is noted about the lower leg and ankle, with a dressing overlying the medial aspect of the ankle. Diffuse vascular calcifications are seen. Mild degenerative change is noted at the talar dome. A plantar calcaneal spur is seen. An os peroneum is noted. IMPRESSION: 1. No evidence of fracture or dislocation. No definite evidence for osteomyelitis. However, diffuse osteopenia of visualized osseous structures limits evaluation for erosions. 2. Diffuse vascular calcifications seen. 3. Soft tissue swelling about the lower leg and ankle. Electronically Signed    By: Roanna Raider M.D.   On: 10/29/2018 22:43   Dg Ankle 2 Views Right  Result Date: 10/29/2018 CLINICAL DATA:  Assess cellulitis.  Sepsis. EXAM: RIGHT ANKLE - 2 VIEW COMPARISON:  None. FINDINGS: There is no evidence of fracture or dislocation. No osseous erosions are seen to suggest osteomyelitis. The ankle mortise is grossly unremarkable in appearance. Mild degenerative change is noted about the midfoot. A small plantar calcaneal spur is seen. An os peroneum is noted. Scattered vascular calcifications are noted. No significant soft tissue swelling is characterized on radiograph. IMPRESSION: 1. No osseous erosions seen to suggest osteomyelitis. No evidence of fracture or dislocation. 2. Scattered vascular calcifications seen. Electronically Signed   By: Roanna Raider M.D.   On: 10/29/2018 22:42   Dg Chest Port 1 View  Result Date: 11/03/2018 CLINICAL DATA:  dyspnea EXAM: PORTABLE CHEST 1 VIEW COMPARISON:  Chest radiograph 10/29/2018 FINDINGS: Stable enlarged cardiac silhouette. Small RIGHT effusion similar prior. Lungs are hyperinflated. No infiltrate or pneumothorax. Apical calcific scarring. IMPRESSION: 1. Hyperinflated lungs. 2. Persistent RIGHT pleural effusion. Electronically Signed   By: Genevive Bi M.D.   On: 11/03/2018 10:38   Dg Chest Port 1 View  Result Date: 10/29/2018 CLINICAL DATA:  Dyspnea EXAM: PORTABLE CHEST 1 VIEW COMPARISON:  07/01/2016 CXR FINDINGS: Emphysematous hyperinflation of the lungs with stable cardiomegaly and aortic atherosclerosis. Scarring and atelectasis at the right lung base. Blunting of the right costophrenic angle may reflect a small effusion or pleural thickening. Osteoarthritis of the glenohumeral joint on the right with inferomedial spurring off the humeral head. Mild AC joint osteoarthritis is also noted on the right. IMPRESSION: COPD with aortic atherosclerosis. Electronically Signed   By: Tollie Eth M.D.   On: 10/29/2018 18:47    Subjective: Seen  and examined at bedside and was doing well.  Had no complaints and was alert and oriented to self.  No chest pain, lightheadedness or dizziness.  Rested well.  Nursing reports no acute overnight changes and patient has been deemed stable for discharge and she has been afebrile for last 24 to 48 hours.  Discharge Exam: Vitals:   11/03/18 2012 11/04/18 0613  BP: (!) 145/77 (!) 157/82  Pulse: 100 90  Resp: 20 16  Temp: 97.8 F (36.6 C) 97.9 F (36.6 C)  SpO2: 94% 94%   Vitals:   11/03/18 0811 11/03/18 1415 11/03/18 2012 11/04/18 0613  BP: 140/80 132/83 (!) 145/77 (!) 157/82  Pulse: 88 100 100 90  Resp: 18 16 20 16   Temp:  98.4 F (36.9 C) 97.8 F (36.6 C) 97.9 F (36.6 C)  TempSrc:  Oral Oral Oral  SpO2:  94% 94% 94%  Weight:      Height:       General: Pt is alert, awake, not in acute distress Cardiovascular: Irregularly Irregular, S1/S2 +, no rubs, no gallops Respiratory: Diminished bilaterally, no wheezing, no rhonchi; Coarse breath sounds slightly  Abdominal: Soft, NT, ND, bowel sounds + Extremities: Bilateral Legs in boots to prevent offloading.   The results of significant diagnostics from this hospitalization (including imaging, microbiology, ancillary and laboratory) are listed below for reference.    Microbiology: Recent Results (from the past 240 hour(s))  Blood Culture (routine x 2)     Status: Abnormal   Collection Time: 10/29/18  6:23 PM  Result Value Ref Range Status   Specimen Description   Final    BLOOD RIGHT HAND Performed at [facility], [address], [place], [place] [address]    Special Requests   Final    BOTTLES DRAWN AEROBIC AND ANAEROBIC Blood Culture adequate volume Performed at [facility], [address], [place], [place] [address]    Culture  Setup Time   Final    GRAM POSITIVE COCCI IN CHAINS IN BOTH AEROBIC AND ANAEROBIC BOTTLES CRITICAL RESULT CALLED TO, READ BACK BY AND VERIFIED WITH: PHARM A  PHAM [phone] BY GF    Culture (A)  Final    GROUP A STREP (S.PYOGENES) ISOLATED HEALTH DEPARTMENT NOTIFIED FAXED 11/01/18 CTJ Performed at [facility], [address], [place], [place] [address]    Report Status 11/01/2018 FINAL  Final   Organism ID, Bacteria GROUP A STREP (S.PYOGENES) ISOLATED  Final      Susceptibility   Group a strep (s.pyogenes) isolated - MIC*    PENICILLIN <=0.06 SENSITIVE Sensitive     CEFTRIAXONE <=0.12 SENSITIVE Sensitive     ERYTHROMYCIN <=0.12 SENSITIVE Sensitive     LEVOFLOXACIN 0.5 SENSITIVE Sensitive     VANCOMYCIN <=0.12 SENSITIVE Sensitive     TIGECYCLINE <=0.06 SENSITIVE Sensitive     * GROUP A STREP (S.PYOGENES) ISOLATED  Urine culture     Status: None   Collection Time: 10/29/18  6:23 PM  Result Value Ref Range Status   Specimen Description   Final    URINE, RANDOM Performed at [facility], [address], [place], [place] [address]    Special Requests   Final    NONE Performed at [facility], [address], [place], [place] [address]    Culture   Final    NO GROWTH Performed at [facility], [address] [place], [place] [address]    Report Status 10/31/2018 FINAL  Final  Blood Culture ID Panel (Reflexed)     Status: Abnormal   Collection Time: 10/29/18  6:23 PM  Result Value Ref Range Status   Enterococcus species NOT DETECTED NOT DETECTED Final   Listeria monocytogenes NOT DETECTED NOT DETECTED Final   Staphylococcus species NOT DETECTED NOT DETECTED Final   Staphylococcus aureus (BCID) NOT DETECTED NOT DETECTED Final   Streptococcus species DETECTED (A) NOT DETECTED Final    Comment: CRITICAL RESULT CALLED TO, READ BACK BY AND VERIFIED WITH: PHARMD A PHAM [phone] BY GF    Streptococcus agalactiae NOT DETECTED NOT DETECTED Final   Streptococcus pneumoniae NOT DETECTED NOT DETECTED Final   Streptococcus pyogenes DETECTED (A) NOT DETECTED Final    Comment: CRITICAL  RESULT CALLED TO, READ BACK BY AND VERIFIED WITH: PHARMD A PHAM [phone] BY GF    Acinetobacter baumannii NOT DETECTED NOT DETECTED Final   Enterobacteriaceae species NOT DETECTED NOT DETECTED Final   Enterobacter cloacae complex NOT DETECTED NOT DETECTED Final   Escherichia coli NOT DETECTED NOT DETECTED Final   Klebsiella oxytoca NOT DETECTED NOT DETECTED Final   Klebsiella pneumoniae NOT DETECTED NOT DETECTED Final   Proteus species NOT DETECTED NOT DETECTED Final   Serratia marcescens NOT DETECTED NOT DETECTED Final   Haemophilus influenzae NOT DETECTED NOT DETECTED Final   Neisseria meningitidis NOT DETECTED NOT DETECTED Final   Pseudomonas aeruginosa NOT DETECTED NOT DETECTED Final   Candida albicans NOT DETECTED NOT DETECTED Final   Candida glabrata NOT DETECTED NOT DETECTED Final   Candida krusei NOT DETECTED NOT DETECTED Final   Candida parapsilosis NOT DETECTED NOT DETECTED Final   Candida tropicalis NOT DETECTED NOT DETECTED Final    Comment: Performed at [facility], [address], [place], [place] [address]  MRSA PCR Screening     Status: None  Collection Time: 10/29/18 10:58 PM  Result Value Ref Range Status   MRSA by PCR NEGATIVE NEGATIVE Final    Comment:        The GeneXpert MRSA Assay (FDA approved for NASAL specimens only), is one component of a comprehensive MRSA colonization surveillance program. It is not intended to diagnose MRSA infection nor to guide or monitor treatment for MRSA infections. Performed at [facility], [address], [place], [place] [address]   Blood Culture (routine x 2)     Status: None (Preliminary result)   Collection Time: 10/30/18  3:08 AM  Result Value Ref Range Status   Specimen Description   Final    BLOOD BLOOD RIGHT HAND Performed at [facility], [address], [place], [place] [address]    Special Requests   Final    BOTTLES DRAWN AEROBIC ONLY Blood Culture adequate  volume Performed at [facility], [address] [place], [place] [address]    Culture   Final    NO GROWTH 4 DAYS Performed at [facility], [address] [place], [place] [address]    Report Status PENDING  Incomplete    Labs: BNP (last 3 results) No results for input(s): BNP in the last 8760 hours. Basic Metabolic Panel: Recent Labs  Lab 10/30/18 0308 10/31/18 0349 11/01/18 0610 11/02/18 0600 11/03/18 0620 11/04/18 0608  NA 140 140 139 141 139  --   K 4.1 4.0 4.0 4.0 3.7  --   CL 111 112* 110 113* 110  --   CO2 17* 18* 17* 18* 19*  --   GLUCOSE 114* 74 86 71 82  --   BUN 24* 26* 24* 19 17  --   CREATININE 0.76 0.90 0.87 0.71 0.66 0.55  CALCIUM 7.8* 7.9* 8.2* 8.3* 8.5*  --   MG  --  1.7  --   --   --   --    Liver Function Tests: Recent Labs  Lab 10/29/18 1823 10/30/18 0308 10/31/18 0349  AST 89* 64* 37  ALT 57* 48* 34  ALKPHOS 141* 107 98  BILITOT 1.0 1.4* 0.9  PROT 7.4 5.9* 6.6  ALBUMIN 3.4* 2.7* 2.9*   No results for input(s): LIPASE, AMYLASE in the last 168 hours. No results for input(s): AMMONIA in the last 168 hours. CBC: Recent Labs  Lab 10/29/18 1823 10/30/18 0308 10/31/18 0349 11/01/18 0610 11/02/18 0600 11/03/18 0620  WBC 20.1* 23.4* 13.7* 12.0* 10.6* 11.7*  NEUTROABS 18.5*  --   --   --   --   --   HGB 12.6 10.4* 11.7* 12.1 12.2 12.5  HCT 39.3 33.8* 36.8 38.4 39.0 39.5  MCV 98.7 101.2* 98.7 99.7 97.3 97.3  PLT 215 191 197 212 249 261   Cardiac Enzymes: No results for input(s): CKTOTAL, CKMB, CKMBINDEX, TROPONINI in the last 168 hours. BNP: Invalid input(s): POCBNP CBG: No results for input(s): GLUCAP in the last 168 hours. D-Dimer No results for input(s): DDIMER in the last 72 hours. Hgb A1c No results for input(s): HGBA1C in the last 72 hours. Lipid Profile No results for input(s): CHOL, HDL, LDLCALC, TRIG, CHOLHDL, LDLDIRECT in the last 72 hours. Thyroid function studies No results for input(s): TSH,  T4TOTAL, T3FREE, THYROIDAB in the last 72 hours.  Invalid input(s): FREET3 Anemia work up No results for input(s): VITAMINB12, FOLATE, FERRITIN, TIBC, IRON, RETICCTPCT in the last 72 hours. Urinalysis    Component Value Date/Time  COLORURINE YELLOW 10/29/2018 1823   APPEARANCEUR CLEAR 10/29/2018 1823   LABSPEC 1.015 10/29/2018 1823   PHURINE 5.0 10/29/2018 1823   GLUCOSEU NEGATIVE 10/29/2018 1823   HGBUR NEGATIVE 10/29/2018 1823   BILIRUBINUR NEGATIVE 10/29/2018 1823   KETONESUR NEGATIVE 10/29/2018 1823   PROTEINUR NEGATIVE 10/29/2018 1823   UROBILINOGEN 0.2 10/30/2014 2242   NITRITE NEGATIVE 10/29/2018 1823   LEUKOCYTESUR NEGATIVE 10/29/2018 1823   Sepsis Labs Invalid input(s): PROCALCITONIN,  WBC,  LACTICIDVEN Microbiology Recent Results (from the past 240 hour(s))  Blood Culture (routine x 2)     Status: Abnormal   Collection Time: 10/29/18  6:23 PM  Result Value Ref Range Status   Specimen Description   Final    BLOOD RIGHT HAND Performed at [facility], [address] [place], [place] [address]    Special Requests   Final    BOTTLES DRAWN AEROBIC AND ANAEROBIC Blood Culture adequate volume Performed at [facility], [address] [place], [place] [address]    Culture  Setup Time   Final    GRAM POSITIVE COCCI IN CHAINS IN BOTH AEROBIC AND ANAEROBIC BOTTLES CRITICAL RESULT CALLED TO, READ BACK BY AND VERIFIED WITH: PHARM A PHAM [id number] BY GF    Culture (A)  Final    GROUP A STREP (S.PYOGENES) ISOLATED HEALTH DEPARTMENT NOTIFIED FAXED 11/01/18 CTJ Performed at [facility], [address] [place], [place] [address]    Report Status 11/01/2018 FINAL  Final   Organism ID, Bacteria GROUP A STREP (S.PYOGENES) ISOLATED  Final      Susceptibility   Group a strep (s.pyogenes) isolated - MIC*    PENICILLIN <=0.06 SENSITIVE Sensitive     CEFTRIAXONE <=0.12 SENSITIVE Sensitive     ERYTHROMYCIN <=0.12 SENSITIVE  Sensitive     LEVOFLOXACIN 0.5 SENSITIVE Sensitive     VANCOMYCIN <=0.12 SENSITIVE Sensitive     TIGECYCLINE <=0.06 SENSITIVE Sensitive     * GROUP A STREP (S.PYOGENES) ISOLATED  Urine culture     Status: None   Collection Time: 10/29/18  6:23 PM  Result Value Ref Range Status   Specimen Description   Final    URINE, RANDOM Performed at [facility], [address], [place], [place] [address]    Special Requests   Final    NONE Performed at [facility], [address] [place], [place] [address]    Culture   Final    NO GROWTH Performed at [facility], [address] [place], [place] [address]    Report Status 10/31/2018 FINAL  Final  Blood Culture ID Panel (Reflexed)     Status: Abnormal   Collection Time: 10/29/18  6:23 PM  Result Value Ref Range Status   Enterococcus species NOT DETECTED NOT DETECTED Final   Listeria monocytogenes NOT DETECTED NOT DETECTED Final   Staphylococcus species NOT DETECTED NOT DETECTED Final   Staphylococcus aureus (BCID) NOT DETECTED NOT DETECTED Final   Streptococcus species DETECTED (A) NOT DETECTED Final    Comment: CRITICAL RESULT CALLED TO, READ BACK BY AND VERIFIED WITH: PHARMD A PHAM [phone] BY GF    Streptococcus agalactiae NOT DETECTED NOT DETECTED Final   Streptococcus pneumoniae NOT DETECTED NOT DETECTED Final   Streptococcus pyogenes DETECTED (A) NOT DETECTED Final    Comment: CRITICAL RESULT CALLED TO, READ BACK BY AND VERIFIED WITH: PHARMD A PHAM [phone] BY GF    Acinetobacter baumannii NOT DETECTED NOT  DETECTED Final   Enterobacteriaceae species NOT DETECTED NOT DETECTED Final   Enterobacter cloacae complex NOT DETECTED NOT DETECTED Final   Escherichia coli NOT DETECTED NOT DETECTED Final   Klebsiella oxytoca NOT DETECTED NOT DETECTED Final   Klebsiella pneumoniae NOT DETECTED NOT DETECTED Final   Proteus species NOT DETECTED NOT DETECTED Final   Serratia marcescens NOT  DETECTED NOT DETECTED Final   Haemophilus influenzae NOT DETECTED NOT DETECTED Final   Neisseria meningitidis NOT DETECTED NOT DETECTED Final   Pseudomonas aeruginosa NOT DETECTED NOT DETECTED Final   Candida albicans NOT DETECTED NOT DETECTED Final   Candida glabrata NOT DETECTED NOT DETECTED Final   Candida krusei NOT DETECTED NOT DETECTED Final   Candida parapsilosis NOT DETECTED NOT DETECTED Final   Candida tropicalis NOT DETECTED NOT DETECTED Final    Comment: Performed at [facility], [address], [place], [place] [address]  MRSA PCR Screening     Status: None   Collection Time: 10/29/18 10:58 PM  Result Value Ref Range Status   MRSA by PCR NEGATIVE NEGATIVE Final    Comment:        The GeneXpert MRSA Assay (FDA approved for NASAL specimens only), is one component of a comprehensive MRSA colonization surveillance program. It is not intended to diagnose MRSA infection nor to guide or monitor treatment for MRSA infections. Performed at [facility], [address], [place], [place] [address]   Blood Culture (routine x 2)     Status: None (Preliminary result)   Collection Time: 10/30/18  3:08 AM  Result Value Ref Range Status   Specimen Description   Final    BLOOD BLOOD RIGHT HAND Performed at [facility], [address], [place], [place] [address]    Special Requests   Final    BOTTLES DRAWN AEROBIC ONLY Blood Culture adequate volume Performed at [facility], [address] [place], [place] [address]    Culture   Final    NO GROWTH 4 DAYS Performed at [facility], [address] [place], [place] [address]    Report Status PENDING  Incomplete   Time coordinating discharge: 35 minutes  SIGNED:  Merlene Laughter, DO [facility] 11/04/2018, 11:33 AM Pager is on AMION  If 7PM-7AM, please contact night-coverage [email or web address] Password [email or web address]

## 2018-11-04 NOTE — Progress Notes (Signed)
Called report to [facility]. Gave report to RN

## 2018-11-04 NOTE — Progress Notes (Addendum)
Patient discharging to [facility]. CSW confirmed ability to return and faxed appropriate documents.  [facility] called for transport and d/c packet complete. RN call report: [phone].  CSW signing off.   Enid Cutter, MSW, [employer] Clinical Social Work [phone] (Weekend)

## 2018-11-08 ENCOUNTER — Encounter (HOSPITAL_BASED_OUTPATIENT_CLINIC_OR_DEPARTMENT_OTHER): Payer: Medicare Other | Attending: Internal Medicine

## 2018-11-08 DIAGNOSIS — L97822 Non-pressure chronic ulcer of other part of left lower leg with fat layer exposed: Secondary | ICD-10-CM | POA: Insufficient documentation

## 2018-11-08 DIAGNOSIS — I482 Chronic atrial fibrillation, unspecified: Secondary | ICD-10-CM | POA: Insufficient documentation

## 2018-11-08 DIAGNOSIS — I10 Essential (primary) hypertension: Secondary | ICD-10-CM | POA: Diagnosis not present

## 2018-11-08 DIAGNOSIS — Z7901 Long term (current) use of anticoagulants: Secondary | ICD-10-CM | POA: Insufficient documentation

## 2018-11-08 DIAGNOSIS — I739 Peripheral vascular disease, unspecified: Secondary | ICD-10-CM | POA: Insufficient documentation

## 2018-11-08 DIAGNOSIS — L97812 Non-pressure chronic ulcer of other part of right lower leg with fat layer exposed: Secondary | ICD-10-CM | POA: Insufficient documentation

## 2018-11-08 DIAGNOSIS — I87313 Chronic venous hypertension (idiopathic) with ulcer of bilateral lower extremity: Secondary | ICD-10-CM | POA: Insufficient documentation

## 2018-11-16 ENCOUNTER — Encounter (HOSPITAL_BASED_OUTPATIENT_CLINIC_OR_DEPARTMENT_OTHER): Payer: Medicare Other | Attending: Internal Medicine

## 2018-11-16 DIAGNOSIS — M069 Rheumatoid arthritis, unspecified: Secondary | ICD-10-CM | POA: Diagnosis not present

## 2018-11-16 DIAGNOSIS — I10 Essential (primary) hypertension: Secondary | ICD-10-CM | POA: Diagnosis not present

## 2018-11-16 DIAGNOSIS — L97812 Non-pressure chronic ulcer of other part of right lower leg with fat layer exposed: Secondary | ICD-10-CM | POA: Diagnosis not present

## 2018-11-16 DIAGNOSIS — L97521 Non-pressure chronic ulcer of other part of left foot limited to breakdown of skin: Secondary | ICD-10-CM | POA: Diagnosis not present

## 2018-11-16 DIAGNOSIS — I87313 Chronic venous hypertension (idiopathic) with ulcer of bilateral lower extremity: Secondary | ICD-10-CM | POA: Insufficient documentation

## 2018-11-16 DIAGNOSIS — L97822 Non-pressure chronic ulcer of other part of left lower leg with fat layer exposed: Secondary | ICD-10-CM | POA: Diagnosis not present

## 2018-11-16 DIAGNOSIS — I482 Chronic atrial fibrillation, unspecified: Secondary | ICD-10-CM | POA: Diagnosis not present

## 2018-11-16 DIAGNOSIS — I739 Peripheral vascular disease, unspecified: Secondary | ICD-10-CM | POA: Diagnosis not present

## 2018-11-19 ENCOUNTER — Other Ambulatory Visit: Payer: Self-pay

## 2018-11-19 ENCOUNTER — Emergency Department (HOSPITAL_COMMUNITY): Payer: Medicare Other

## 2018-11-19 ENCOUNTER — Encounter (HOSPITAL_COMMUNITY): Payer: Self-pay

## 2018-11-19 ENCOUNTER — Observation Stay (HOSPITAL_COMMUNITY)
Admission: EM | Admit: 2018-11-19 | Discharge: 2018-11-21 | Disposition: A | Discharge status: Home / Self Care | Payer: Medicare Other | Attending: Internal Medicine | Admitting: Internal Medicine

## 2018-11-19 DIAGNOSIS — I482 Chronic atrial fibrillation, unspecified: Secondary | ICD-10-CM | POA: Diagnosis not present

## 2018-11-19 DIAGNOSIS — G9340 Encephalopathy, unspecified: Secondary | ICD-10-CM | POA: Diagnosis not present

## 2018-11-19 DIAGNOSIS — N179 Acute kidney failure, unspecified: Principal | ICD-10-CM | POA: Insufficient documentation

## 2018-11-19 DIAGNOSIS — E860 Dehydration: Secondary | ICD-10-CM | POA: Insufficient documentation

## 2018-11-19 DIAGNOSIS — G9341 Metabolic encephalopathy: Secondary | ICD-10-CM | POA: Diagnosis not present

## 2018-11-19 DIAGNOSIS — I110 Hypertensive heart disease with heart failure: Secondary | ICD-10-CM | POA: Insufficient documentation

## 2018-11-19 DIAGNOSIS — K922 Gastrointestinal hemorrhage, unspecified: Secondary | ICD-10-CM | POA: Insufficient documentation

## 2018-11-19 DIAGNOSIS — Z79899 Other long term (current) drug therapy: Secondary | ICD-10-CM | POA: Diagnosis not present

## 2018-11-19 DIAGNOSIS — I5032 Chronic diastolic (congestive) heart failure: Secondary | ICD-10-CM | POA: Diagnosis not present

## 2018-11-19 DIAGNOSIS — F329 Major depressive disorder, single episode, unspecified: Secondary | ICD-10-CM | POA: Diagnosis not present

## 2018-11-19 DIAGNOSIS — E039 Hypothyroidism, unspecified: Secondary | ICD-10-CM | POA: Insufficient documentation

## 2018-11-19 DIAGNOSIS — I10 Essential (primary) hypertension: Secondary | ICD-10-CM | POA: Diagnosis not present

## 2018-11-19 DIAGNOSIS — R7989 Other specified abnormal findings of blood chemistry: Secondary | ICD-10-CM

## 2018-11-19 DIAGNOSIS — Z7901 Long term (current) use of anticoagulants: Secondary | ICD-10-CM | POA: Diagnosis not present

## 2018-11-19 DIAGNOSIS — D649 Anemia, unspecified: Secondary | ICD-10-CM

## 2018-11-19 DIAGNOSIS — Z1211 Encounter for screening for malignant neoplasm of colon: Secondary | ICD-10-CM

## 2018-11-19 DIAGNOSIS — R509 Fever, unspecified: Secondary | ICD-10-CM | POA: Diagnosis present

## 2018-11-19 DIAGNOSIS — N19 Unspecified kidney failure: Secondary | ICD-10-CM | POA: Diagnosis present

## 2018-11-19 DIAGNOSIS — I4891 Unspecified atrial fibrillation: Secondary | ICD-10-CM

## 2018-11-19 LAB — BASIC METABOLIC PANEL
ANION GAP: 10 (ref 5–15)
BUN: 19 mg/dL (ref 8–23)
CHLORIDE: 99 mmol/L (ref 98–111)
CO2: 30 mmol/L (ref 22–32)
Calcium: 8.6 mg/dL — ABNORMAL LOW (ref 8.9–10.3)
Creatinine, Ser: 1.08 mg/dL — ABNORMAL HIGH (ref 0.44–1.00)
GFR calc Af Amer: 51 mL/min — ABNORMAL LOW (ref >60)
GFR calc non Af Amer: 44 mL/min — ABNORMAL LOW (ref >60)
Glucose, Bld: 91 mg/dL (ref 70–99)
POTASSIUM: 4.1 mmol/L (ref 3.5–5.1)
Sodium: 139 mmol/L (ref 135–145)

## 2018-11-19 LAB — CBC WITH DIFFERENTIAL/PLATELET
ABS IMMATURE GRANULOCYTES: 0.04 10*3/uL (ref 0.00–0.07)
Basophils Absolute: 0.1 10*3/uL (ref 0.0–0.1)
Basophils Relative: 1 %
Eosinophils Absolute: 0.2 10*3/uL (ref 0.0–0.5)
Eosinophils Relative: 3 %
HEMATOCRIT: 32.4 % — AB (ref 36.0–46.0)
HEMOGLOBIN: 10.3 g/dL — AB (ref 12.0–15.0)
IMMATURE GRANULOCYTES: 1 %
LYMPHS ABS: 1.3 10*3/uL (ref 0.7–4.0)
Lymphocytes Relative: 16 %
MCH: 31.5 pg (ref 26.0–34.0)
MCHC: 31.8 g/dL (ref 30.0–36.0)
MCV: 99.1 fL (ref 80.0–100.0)
MONO ABS: 0.9 10*3/uL (ref 0.1–1.0)
MONOS PCT: 11 %
NEUTROS ABS: 5.3 10*3/uL (ref 1.7–7.7)
Neutrophils Relative %: 68 %
Platelets: 286 10*3/uL (ref 150–400)
RBC: 3.27 MIL/uL — ABNORMAL LOW (ref 3.87–5.11)
RDW: 17.9 % — ABNORMAL HIGH (ref 11.5–15.5)
WBC: 7.8 10*3/uL (ref 4.0–10.5)
nRBC: 0 % (ref 0.0–0.2)

## 2018-11-19 LAB — INFLUENZA PANEL BY PCR (TYPE A & B)
Influenza A By PCR: NEGATIVE
Influenza B By PCR: NEGATIVE

## 2018-11-19 LAB — URINALYSIS, ROUTINE W REFLEX MICROSCOPIC
Bilirubin Urine: NEGATIVE
Glucose, UA: NEGATIVE mg/dL
Hgb urine dipstick: NEGATIVE
Ketones, ur: NEGATIVE mg/dL
Leukocytes, UA: NEGATIVE
Nitrite: NEGATIVE
Protein, ur: NEGATIVE mg/dL
Specific Gravity, Urine: 1.017 (ref 1.005–1.030)
pH: 7 (ref 5.0–8.0)

## 2018-11-19 LAB — I-STAT CG4 LACTIC ACID, ED: Lactic Acid, Venous: 1.05 mmol/L (ref 0.5–1.9)

## 2018-11-19 LAB — POC OCCULT BLOOD, ED: Fecal Occult Bld: POSITIVE — AB

## 2018-11-19 MED ORDER — ACETAMINOPHEN 325 MG PO TABS
650.0000 mg | ORAL_TABLET | Freq: Four times a day (QID) | ORAL | Status: DC | PRN
  Administered 2018-11-19: 650 mg via ORAL
  Filled 2018-11-19: qty 2

## 2018-11-19 MED ORDER — SERTRALINE HCL 50 MG PO TABS
75.0000 mg | ORAL_TABLET | Freq: Every day | ORAL | Status: DC
  Administered 2018-11-19 – 2018-11-20 (×2): 75 mg via ORAL
  Filled 2018-11-19 (×3): qty 1

## 2018-11-19 MED ORDER — ENSURE ENLIVE PO LIQD
237.0000 mL | ORAL | Status: DC
  Administered 2018-11-19: 237 mL via ORAL

## 2018-11-19 MED ORDER — ACETAMINOPHEN 650 MG RE SUPP: 650.0000 mg | Freq: Four times a day (QID) | RECTAL | Status: DC | PRN

## 2018-11-19 MED ORDER — MINERAL OIL LIGHT OIL: 2.0000 [drp] | TOPICAL_OIL | Status: DC

## 2018-11-19 MED ORDER — TRAMADOL HCL 50 MG PO TABS
50.0000 mg | ORAL_TABLET | Freq: Two times a day (BID) | ORAL | Status: DC
  Administered 2018-11-19: 50 mg via ORAL
  Filled 2018-11-19 (×2): qty 1

## 2018-11-19 MED ORDER — ELDERTONIC PO LIQD
15.0000 mL | Freq: Every day | ORAL | Status: DC
  Administered 2018-11-19: 15 mL via ORAL
  Filled 2018-11-19 (×2): qty 15

## 2018-11-19 MED ORDER — PANTOPRAZOLE SODIUM 40 MG PO TBEC
40.0000 mg | DELAYED_RELEASE_TABLET | Freq: Every day | ORAL | Status: DC
  Administered 2018-11-19 – 2018-11-20 (×2): 40 mg via ORAL
  Filled 2018-11-19 (×3): qty 1

## 2018-11-19 MED ORDER — ONDANSETRON HCL 4 MG PO TABS: 4.0000 mg | ORAL_TABLET | Freq: Four times a day (QID) | ORAL | Status: DC | PRN

## 2018-11-19 MED ORDER — VITAMIN B-1 100 MG PO TABS
100.0000 mg | ORAL_TABLET | Freq: Every day | ORAL | Status: DC
  Administered 2018-11-19 – 2018-11-20 (×2): 100 mg via ORAL
  Filled 2018-11-19 (×3): qty 1

## 2018-11-19 MED ORDER — TRAMADOL HCL 50 MG PO TABS: 50.0000 mg | ORAL_TABLET | Freq: Every day | ORAL | Status: DC | PRN

## 2018-11-19 MED ORDER — SODIUM CHLORIDE 0.9 % IV BOLUS
500.0000 mL | Freq: Once | INTRAVENOUS | Status: AC
  Administered 2018-11-19: 500 mL via INTRAVENOUS

## 2018-11-19 MED ORDER — MINERAL OIL OIL: 2.0000 [drp] | TOPICAL_OIL | Status: DC

## 2018-11-19 MED ORDER — LEVOTHYROXINE SODIUM 100 MCG PO TABS
100.0000 ug | ORAL_TABLET | Freq: Every day | ORAL | Status: DC
  Administered 2018-11-19 – 2018-11-20 (×2): 100 ug via ORAL
  Filled 2018-11-19 (×2): qty 1

## 2018-11-19 MED ORDER — DEXTROSE IN LACTATED RINGERS 5 % IV SOLN
INTRAVENOUS | Status: DC
  Administered 2018-11-19: 14:00:00 via INTRAVENOUS

## 2018-11-19 MED ORDER — VITAMIN B-12 1000 MCG PO TABS
1000.0000 ug | ORAL_TABLET | Freq: Every day | ORAL | Status: DC
  Administered 2018-11-19 – 2018-11-20 (×2): 1000 ug via ORAL
  Filled 2018-11-19 (×3): qty 1

## 2018-11-19 MED ORDER — ALBUTEROL SULFATE (2.5 MG/3ML) 0.083% IN NEBU: 2.5000 mg | INHALATION_SOLUTION | Freq: Four times a day (QID) | RESPIRATORY_TRACT | Status: DC | PRN

## 2018-11-19 MED ORDER — ONDANSETRON HCL 4 MG/2ML IJ SOLN: 4.0000 mg | Freq: Four times a day (QID) | INTRAMUSCULAR | Status: DC | PRN

## 2018-11-19 MED ORDER — HEPARIN SODIUM (PORCINE) 5000 UNIT/ML IJ SOLN
5000.0000 [IU] | Freq: Three times a day (TID) | INTRAMUSCULAR | Status: DC
  Administered 2018-11-19 – 2018-11-20 (×2): 5000 [IU] via SUBCUTANEOUS
  Filled 2018-11-19 (×3): qty 1

## 2018-11-19 MED ORDER — CARVEDILOL 6.25 MG PO TABS
6.2500 mg | ORAL_TABLET | Freq: Every day | ORAL | Status: DC
  Administered 2018-11-19 – 2018-11-20 (×2): 6.25 mg via ORAL
  Filled 2018-11-19 (×3): qty 1

## 2018-11-19 NOTE — ED Notes (Signed)
Family at bedside. 

## 2018-11-19 NOTE — ED Triage Notes (Signed)
Per EMS, patient coming from [facility] with a fever. She was being treated for sepsis and strep throat last month (10/29/18). Nursing staff at SNF did not know if she is currently on antibiotics. Patient is A&O x4.

## 2018-11-19 NOTE — ED Provider Notes (Signed)
[facility]-EMERGENCY DEPT Provider Note   CSN: [id number] Arrival date & time: 11/19/18  0631     History   Chief Complaint Chief Complaint  Patient presents with  . Fever    HPI Brittany Schultz is a 84 y.o. female with history of A. fib on anticoagulation, CHF, HLD, HTN, PVD, chronic lower extremity wounds presents for evaluation of acute onset of fever.  She was sent from [facility] facility for evaluation of a fever.  Unknown when her temperature was taken but apparently she had a temperature of 91.9 F was called.  When EMS checked her vitals, her temperature was 101 F.  She was not given any antipyretics and her temperature here is 97.9 F.  She is currently oriented to person and place but not time, and denies any complaints.  She denies headaches, chest pain, abdominal pain, shortness of breath, nausea, or vomiting.  Of note, patient was recently discharged on 11/04/2018 for sepsis due to cellulitis of chronic wounds of the bilateral lower extremities.  She was discharged on a 7-day course of Keflex.  Her facility is unsure if she is currently on any antibiotics.  The history is provided by the patient.    Past Medical History:  Diagnosis Date  . A-fib [facility])    hospitalized 04/2009  . CHF (congestive heart failure) (HCC)   . Deaf   . Dermatitis 01/2014  . History of shingles   . Hx of colonic polyps    Adenomatous polyps  . Hyperlipidemia   . Hypertension   . Lack of coordination   . Long term (current) use of anticoagulants    warfarin for stroke risk reduction re: AF  . Muscle weakness (generalized)   . Open wound of left lower leg 02/2104  . Osteoarthritis of left hip   . Other malaise and fatigue   . Peripheral vascular disease, unspecified (HCC)   . PVD (peripheral vascular disease) (HCC)   . Shortness of breath 03/23/14  . Thrombocytopenia (HCC) April 2015   144,000  . Unspecified hypothyroidism   . Unstable gait   . Varicose veins of  lower extremities with ulcer (HCC)   . Venous insufficiency of both lower extremities    vascular evaluation 10/2013, 01/2014 Dr.Powell  . Venous stasis ulcer of left lower extremity (HCC)   . Vitamin D deficiency     Patient Active Problem List   Diagnosis Date Noted  . Renal failure 11/19/2018  . Encephalopathy in sepsis 10/31/2018  . Sepsis (HCC) 10/29/2018  . Sepsis due to cellulitis (HCC) 10/29/2018  . Upper airway cough syndrome 07/01/2016  . Pleural effusion on right 07/01/2016  . Acute CHF (HCC) 10/31/2014  . Acute encephalopathy 10/31/2014  . Atrial fibrillation (HCC) 10/31/2014  . Subacute confusional state 10/30/2014  . Cellulitis 10/30/2014  . 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 Lymphedema 04/20/2014  . Debility 03/30/2014  . Shortness of breath 03/23/2014  . Deaf   . Varicose veins of lower extremities with ulcer (HCC)   . Peripheral vascular disease (HCC)   . Muscle weakness (generalized)   . Other malaise and fatigue   . Lack of coordination   . Unstable gait   . Hyperlipidemia   . Venous insufficiency of both lower extremities   . Open wound of left lower leg   . Hypertension   . Osteoarthritis of left hip   . Long term current use of anticoagulant therapy   .  Thrombocytopenia (HCC) 03/15/2014  . Cellulitis and abscess of leg, except foot 02/25/2014  . Generalized weakness 02/21/2014  . Unspecified constipation 02/21/2014  . Dermatitis 02/21/2014  . Unspecified hypothyroidism 02/21/2014  . Essential hypertension, benign 02/19/2014  . Congestive heart failure (HCC) 02/19/2014  . A-fib (HCC) 02/19/2014  . Macular rash 02/19/2014  . Osteoarthritis 02/19/2014  . Hypothyroidism 02/19/2014  . Venous stasis ulcer of left lower extremity (HCC) 02/19/2014    Past Surgical History:  Procedure Laterality Date  . CATARACT EXTRACTION W/ INTRAOCULAR LENS  IMPLANT, BILATERAL Bilateral      OB History   None       Home Medications    Prior to Admission medications   Medication Sig Start Date End Date Taking? Authorizing Provider  acetaminophen (TYLENOL) 325 MG tablet Take 650 mg by mouth 3 (three) times daily.  03/17/14  Yes Krell, Claudette T, NP  albuterol (PROVENTIL) (2.5 MG/3ML) 0.083% nebulizer solution Take 3 mLs (2.5 mg total) by nebulization every 6 (six) hours as needed for wheezing or shortness of breath. 11/04/18  Yes Sheikh, Omair Latif, DO  carvedilol (COREG) 6.25 MG tablet Take 6.25 mg by mouth daily.    Yes [provider]  diclofenac sodium (VOLTAREN) 1 % GEL Apply 2 g topically 3 (three) times daily. Both lower extremities 06/25/18  Yes [provider]  furosemide (LASIX) 40 MG tablet Take 1 tablet (40 mg total) by mouth daily. 11/02/14  Yes Leroy Sea, MD  hydrALAZINE (APRESOLINE) 25 MG tablet Take 1 tablet (25 mg total) by mouth every 8 (eight) hours. Patient taking differently: Take 25 mg by mouth every 8 (eight) hours.  11/02/14  Yes Leroy Sea, MD  hydrOXYzine (ATARAX/VISTARIL) 25 MG tablet Take 1 tablet (25 mg total) by mouth every 8 (eight) hours as needed. 04/20/14  Yes Krell, Claudette T, NP  levothyroxine (SYNTHROID, LEVOTHROID) 100 MCG tablet Take 100 mcg by mouth at bedtime.    Yes [provider]  lip balm (CARMEX) ointment Apply topically as needed for lip care. 11/03/18  Yes Sheikh, Omair Latif, DO  Nutritional Supplements (JUICE PLUS FIBRE PO) Take 1 capsule by mouth 3 (three) times daily. Take one capsule of each three times a day [employer]   Yes [provider]  potassium chloride SA (K-DUR,KLOR-CON) 20 MEQ tablet Take 20 mEq by mouth daily. 07/01/18  Yes [provider]  rivaroxaban (XARELTO) 20 MG TABS tablet Take 20 mg by mouth daily.    Yes [provider]  sertraline (ZOLOFT) 50 MG tablet Take 75 mg by mouth daily.  10/07/18  Yes [provider]   spironolactone (ALDACTONE) 25 MG tablet Take 1 tablet (25 mg total) by mouth daily. 04/20/14  Yes Krell, Claudette T, NP  traMADol (ULTRAM) 50 MG tablet Take 1 tablet (50 mg total) by mouth as directed. Take 1 tablet BID & Take 1 tablet daily PRN for Pain. 11/03/18  Yes Sheikh, Omair Latif, DO  vitamin B-12 (CYANOCOBALAMIN) 1000 MCG tablet Take 1,000 mcg by mouth daily.   Yes [provider]  VITAMIN D, ERGOCALCIFEROL, PO Take 1,000 Units by mouth daily.    Yes [provider]  Zinc Oxide (DESITIN) 13 % CREA Apply 1 application topically 2 (two) times daily.   Yes [provider]  mineral oil external liquid Place 2 drops into both ears once a week.    [provider]  predniSONE (STERAPRED UNI-PAK 21 TAB) 10 MG (21)  TBPK tablet Take 6 tablets on day 1, 5 tablets on day 2, 4 tablets on day 3, 3 tablets on day 4, 2 tablets on day 5, 1 tablet day 6 and stop on day 7 Patient not taking: Reported on 11/19/2018 11/04/18   Merlene Laughter, DO    Family History Family History  Problem Relation Age of Onset  . Hypertension Mother   . Stroke Mother   . Heart disease Mother   . Hypertension Father   . Hypertension Brother     Social History Social History   Tobacco Use  . Smoking status: Never Smoker  . Smokeless tobacco: Never Used  Substance Use Topics  . Alcohol use: No  . Drug use: No     Allergies   Sulfa antibiotics; Codeine; Penicillins; Sulfonylureas; and Clindamycin/lincomycin   Review of Systems Review of Systems  Constitutional: Positive for fever (?possible).  Respiratory: Negative for cough and shortness of breath.   Cardiovascular: Negative for chest pain.  Gastrointestinal: Negative for abdominal pain, nausea and vomiting.  Genitourinary: Negative for dysuria and hematuria.  Neurological: Negative for light-headedness and headaches.  All other systems reviewed and are negative.    Physical Exam Updated Vital Signs BP 127/78  (BP Location: Left Arm)   Pulse 86   Temp 97.9 F (36.6 C) (Oral)   Resp 18   SpO2 95%   Physical Exam  Constitutional: She appears well-developed and well-nourished. No distress.  HENT:  Head: Normocephalic and atraumatic.  Eyes: Conjunctivae are normal. Right eye exhibits no discharge. Left eye exhibits no discharge.  Neck: No JVD present. No tracheal deviation present.  Cardiovascular: Normal rate.  Irregularly irregular rhythm, 2+ radial pulses bilaterally.  Unable to assess DP/PT pulses due to bandages covering the bilateral lower extremity  Pulmonary/Chest: Effort normal and breath sounds normal.  Abdominal: Soft. Bowel sounds are normal. She exhibits no distension. There is no tenderness. There is no guarding.  Mild suprapubic discomfort  Musculoskeletal: She exhibits no edema.  Bandages to bilateral lower extremities.   Neurological: She is alert.  Fluent speech with no evidence of dysarthria or aphasia.  No facial droop.  Cranial nerves appear grossly intact.  Oriented to person and place but not time.  She knows that Garnet Koyanagi is the president.  Skin: Skin is warm and dry. No erythema.  Psychiatric: She has a normal mood and affect. Her behavior is normal.  Nursing note and vitals reviewed.    ED Treatments / Results  Labs (all labs ordered are listed, but only abnormal results are displayed) Labs Reviewed  CBC WITH DIFFERENTIAL/PLATELET - Abnormal; Notable for the following components:      Result Value   RBC 3.27 (*)    Hemoglobin 10.3 (*)    HCT 32.4 (*)    RDW 17.9 (*)    All other components within normal limits  BASIC METABOLIC PANEL - Abnormal; Notable for the following components:   Creatinine, Ser 1.08 (*)    Calcium 8.6 (*)    GFR calc non Af Amer 44 (*)    GFR calc Af Amer 51 (*)    All other components within normal limits  POC OCCULT BLOOD, ED - Abnormal; Notable for the following components:   Fecal Occult Bld POSITIVE (*)    All other  components within normal limits  URINALYSIS, ROUTINE W REFLEX MICROSCOPIC  INFLUENZA PANEL BY PCR (TYPE A & B)  I-STAT CG4 LACTIC ACID, ED    EKG None  Radiology Dg Chest 2 View  Result Date: 11/19/2018 CLINICAL DATA:  Fever EXAM: CHEST - 2 VIEW COMPARISON:  11/03/2018 FINDINGS: Cardiac enlargement with mild vascular congestion. Negative for pulmonary edema. Small bilateral pleural effusions similar to the prior study. Mild right lower lobe atelectasis. Negative for pneumonia. IMPRESSION: Small bilateral pleural effusions unchanged. Mild vascular congestion without edema Mild right lower lobe atelectasis. Electronically Signed   By: Marlan Palau M.D.   On: 11/19/2018 07:50    Procedures Procedures (including critical care time)  Medications Ordered in ED Medications  sodium chloride 0.9 % bolus 500 mL (0 mLs Intravenous Stopped 11/19/18 0931)     Initial Impression / Assessment and Plan / ED Course  I have reviewed the triage vital signs and the nursing notes.  Pertinent labs & imaging results that were available during my care of the patient were reviewed by me and considered in my medical decision making (see chart for details).     Patient sent from her assisted living facility presents for evaluation of fever.  She was hypothermic at the facility and apparently had oral temperature of 101 F with EMS.  Her rectal temperature is normal here.  She is nontoxic in appearance and vital signs are otherwise stable.  Will obtain lab work and chest x-ray for further evaluation.  Lab work reviewed by me shows 2 g drop in hemoglobin in 2 weeks.  Point-of-care Hemoccult is positive.  Per RN no frank rectal bleeding.  Her creatinine has also doubled as compared to lab work obtained 2 weeks ago.  UA does not suggest UTI or nephrolithiasis.  Influenza test negative.  Chest x-ray stable with no evidence of pneumonia.  I spoke with the patient's grandson who is her power of attorney who reports  that she has been more confused as of late.  Feel she would benefit from trending of hemoglobin and fluid rehydration as she does appear dehydrated.  Spoke with Dr. Ella Jubilee with [facility] who agrees to assume care patient bring her into the hospital for further evaluation and management. Discussed with Dr. Rodena Medin who agrees with assessment and plan at this time.  Final Clinical Impressions(s) / ED Diagnoses   Final diagnoses:  Elevated serum creatinine  Encounter for Hemoccult screening    ED Discharge Orders    None       Bennye Alm 11/19/18 1158    Wynetta Fines, MD 11/19/18 1551

## 2018-11-19 NOTE — ED Notes (Signed)
[facility]PA [facility] Provider at bedside.

## 2018-11-19 NOTE — ED Notes (Signed)
PT AT PRESENT HAS INCREASED CONFUSION. FAMILY IS AWARE.  FAMILY WILL BRING BIL HEARING AIDS THIS AFTERNOON. FAMILY STATES THIS PROMOTES HER CONFUSION.

## 2018-11-19 NOTE — Progress Notes (Signed)
Initial Nutrition Assessment  DOCUMENTATION CODES:   Not applicable  INTERVENTION:    Ensure Enlive po once daily, each supplement provides 350 kcal and 20 grams of protein  Magic cup TID with meals, each supplement provides 290 kcal and 9 grams of protein  Provide MVI daily  Liberalize diet to regular  NUTRITION DIAGNOSIS:   Increased nutrient needs related to acute illness as evidenced by estimated needs.  GOAL:   Patient will meet greater than or equal to 90% of their needs  MONITOR:   PO intake, Supplement acceptance, Labs, Weight trends, I & O's  REASON FOR ASSESSMENT:   Consult Assessment of nutrition requirement/status  ASSESSMENT:   Patient with PMH significant for CHF, deafness, HLD, HTN, PVD, and venous insufficiency. Presents this admission with AKI due to dehydration and sub acute GI bleed with ABLA.    Pt remains slightly confused but pleasant during RD visit. Obtained history from granddaughter at bedside. Per granddaughter, pt experience a gradual decline in PO intake. States she lives in ALF and is provided three meals daily that consist of a protein, vegetable, and grain. Pt eats a larger breakfast (eggs, bacon, toast), a larger lunch (meat, vegetable, grain), and skips dinner. They tried to encourage intake of protein supplements but pt''s intake was inconsistent. Pt is currently on a heart healthy diet, recommend liberalizing as pt is 84 yrs old and to maximize calories/protein. Granddaughter denies pt has any issues with chewing or swallowing. Discussed the importance of protein intake to aid in wound healing. Pt amendable to supplementation.   Pt's UBW reported as 180 lb. Records indicate pt weighed 120 lb in 10/29/18 and 157 lb this admission. Unsure if this wt reading was an error or indicative of fluid status. Nutrition-Focused physical exam completed. Bilateral lower extremities were wrapped from ankle to thigh due to wounds. Unable to assess for  muscle depletions in these areas.   Medications reviewed and include: thiamine, Vit B12, D5 in LR @ 75 ml/hr Labs reviewed.   NUTRITION - FOCUSED PHYSICAL EXAM:    Most Recent Value  Orbital Region  No depletion  Upper Arm Region  Mild depletion  Thoracic and Lumbar Region  Unable to assess  Buccal Region  No depletion  Temple Region  Mild depletion  Clavicle Bone Region  Moderate depletion  Clavicle and Acromion Bone Region  Moderate depletion  Scapular Bone Region  Unable to assess  Dorsal Hand  Severe depletion  Patellar Region  Mild depletion  Anterior Thigh Region  No depletion  Posterior Calf Region  Unable to assess  Edema (RD Assessment)  Unable to assess     Diet Order:   Diet Order            Diet regular Room service appropriate? Yes; Fluid consistency: Thin  Diet effective now              EDUCATION NEEDS:   Education needs have been addressed  Skin:  Skin Assessment: Skin Integrity Issues: Skin Integrity Issues:: Other (Comment) Other: venous stasis ulcer BLE  Last BM:  PTA  Height:   Ht Readings from Last 1 Encounters:  10/29/18 5\' 4"  (1.626 m)    Weight:   Wt Readings from Last 1 Encounters:  11/19/18 71.2 kg    Ideal Body Weight:  54.5 kg  BMI:  Body mass index is 26.93 kg/m.  Estimated Nutritional Needs:   Kcal:  1600-1800 kcal  Protein:  80-95 grams  Fluid:  >/= 1.6 L/day  Mariana Single RD, LDN Clinical Nutrition Pager # [phone]

## 2018-11-19 NOTE — H&P (Signed)
History and Physical    ZELMA SNEAD XBL:[id number] DOB: 1922-05-09 DOA: 11/19/2018  PCP: Angela Cox, MD   Patient coming from: SNF   Chief Complaint: Fever and confusion  HPI: Brittany Schultz is a 84 y.o. female with medical history significant of hypertension, dyslipidemia, heart failure, atrial fibrillation, venous insufficiency and peripheral vascular disease.  Patient was brought to the hospital due to fever.  Apparently she has been confused and disoriented at the skilled nursing facility.  I was not able to reach her granddaughter over the phone to get further information, patient is disoriented, at times agitated, not cooperative with the interrogation, and not giving detailed information.  ED Course: patient was found confused and disorientated, with double serum creatinine, drop 2 grams of hb and positive hem occult stools. Referred for admission and further evaluation.   Review of Systems: Unable to obtained due to patient's not cooperation and disorientation.   Past Medical History:  Diagnosis Date  . A-fib [facility])    hospitalized 04/2009  . CHF (congestive heart failure) (HCC)   . Deaf   . Dermatitis 01/2014  . History of shingles   . Hx of colonic polyps    Adenomatous polyps  . Hyperlipidemia   . Hypertension   . Lack of coordination   . Long term (current) use of anticoagulants    warfarin for stroke risk reduction re: AF  . Muscle weakness (generalized)   . Open wound of left lower leg 02/2104  . Osteoarthritis of left hip   . Other malaise and fatigue   . Peripheral vascular disease, unspecified (HCC)   . PVD (peripheral vascular disease) (HCC)   . Shortness of breath 03/23/14  . Thrombocytopenia (HCC) April 2015   144,000  . Unspecified hypothyroidism   . Unstable gait   . Varicose veins of lower extremities with ulcer (HCC)   . Venous insufficiency of both lower extremities    vascular evaluation 10/2013, 01/2014 Dr.Powell  . Venous stasis  ulcer of left lower extremity (HCC)   . Vitamin D deficiency     Past Surgical History:  Procedure Laterality Date  . CATARACT EXTRACTION W/ INTRAOCULAR LENS  IMPLANT, BILATERAL Bilateral      reports that she has never smoked. She has never used smokeless tobacco. She reports that she does not drink alcohol or use drugs.  Allergies  Allergen Reactions  . Sulfa Antibiotics Rash    Rash w/ sulfonomides furosemide, meloxicam  . Codeine     unknown  . Penicillins     unknown  . Sulfonylureas     unknown  . Clindamycin/Lincomycin Itching and Rash    Family History  Problem Relation Age of Onset  . Hypertension Mother   . Stroke Mother   . Heart disease Mother   . Hypertension Father   . Hypertension Brother      Prior to Admission medications   Medication Sig Start Date End Date Taking? Authorizing Provider  acetaminophen (TYLENOL) 325 MG tablet Take 650 mg by mouth 3 (three) times daily.  03/17/14  Yes Krell, Claudette T, NP  albuterol (PROVENTIL) (2.5 MG/3ML) 0.083% nebulizer solution Take 3 mLs (2.5 mg total) by nebulization every 6 (six) hours as needed for wheezing or shortness of breath. 11/04/18  Yes Sheikh, Omair Latif, DO  carvedilol (COREG) 6.25 MG tablet Take 6.25 mg by mouth daily.    Yes [provider]  diclofenac sodium (VOLTAREN) 1 % GEL Apply 2 g topically 3 (three) times  daily. Both lower extremities 06/25/18  Yes [provider]  furosemide (LASIX) 40 MG tablet Take 1 tablet (40 mg total) by mouth daily. 11/02/14  Yes Leroy Sea, MD  hydrALAZINE (APRESOLINE) 25 MG tablet Take 1 tablet (25 mg total) by mouth every 8 (eight) hours. Patient taking differently: Take 25 mg by mouth every 8 (eight) hours.  11/02/14  Yes Leroy Sea, MD  hydrOXYzine (ATARAX/VISTARIL) 25 MG tablet Take 1 tablet (25 mg total) by mouth every 8 (eight) hours as needed. 04/20/14  Yes Krell, Claudette T, NP  levothyroxine (SYNTHROID, LEVOTHROID) 100 MCG tablet  Take 100 mcg by mouth at bedtime.    Yes [provider]  lip balm (CARMEX) ointment Apply topically as needed for lip care. 11/03/18  Yes Sheikh, Omair Latif, DO  Nutritional Supplements (JUICE PLUS FIBRE PO) Take 1 capsule by mouth 3 (three) times daily. Take one capsule of each three times a day [employer]   Yes [provider]  potassium chloride SA (K-DUR,KLOR-CON) 20 MEQ tablet Take 20 mEq by mouth daily. 07/01/18  Yes [provider]  rivaroxaban (XARELTO) 20 MG TABS tablet Take 20 mg by mouth daily.    Yes [provider]  sertraline (ZOLOFT) 50 MG tablet Take 75 mg by mouth daily.  10/07/18  Yes [provider]  spironolactone (ALDACTONE) 25 MG tablet Take 1 tablet (25 mg total) by mouth daily. 04/20/14  Yes Krell, Claudette T, NP  traMADol (ULTRAM) 50 MG tablet Take 1 tablet (50 mg total) by mouth as directed. Take 1 tablet BID & Take 1 tablet daily PRN for Pain. 11/03/18  Yes Sheikh, Omair Latif, DO  vitamin B-12 (CYANOCOBALAMIN) 1000 MCG tablet Take 1,000 mcg by mouth daily.   Yes [provider]  VITAMIN D, ERGOCALCIFEROL, PO Take 1,000 Units by mouth daily.    Yes [provider]  Zinc Oxide (DESITIN) 13 % CREA Apply 1 application topically 2 (two) times daily.   Yes [provider]  mineral oil external liquid Place 2 drops into both ears once a week.    [provider]  predniSONE (STERAPRED UNI-PAK 21 TAB) 10 MG (21) TBPK tablet Take 6 tablets on day 1, 5 tablets on day 2, 4 tablets on day 3, 3 tablets on day 4, 2 tablets on day 5, 1 tablet day 6 and stop on day 7 Patient not taking: Reported on 11/19/2018 11/04/18   Merlene Laughter, DO    Physical Exam: Vitals:   11/19/18 0634 11/19/18 0637 11/19/18 0707 11/19/18 0900  BP: 121/61   127/73  Pulse: 85   70  Resp: 16   18  Temp: 97.9 F (36.6 C)  98.5 F (36.9 C)   TempSrc: Oral  Rectal   SpO2: 97% 98%  96%     Vitals:   11/19/18 0634 11/19/18 0637 11/19/18 0707 11/19/18 0900  BP: 121/61   127/73  Pulse: 85   70  Resp: 16   18  Temp: 97.9 F (36.6 C)  98.5 F (36.9 C)   TempSrc: Oral  Rectal   SpO2: 97% 98%  96%   General: deconditioned, agitated.  Neurology: Awake and alert, non focal, disorientated in time and situation.  Head and Neck. Head normocephalic. Neck supple with no adenopathy or thyromegaly.   E ENT: no pallor, no icterus, oral mucosa dry Cardiovascular: No JVD. S1-S2 present, rhythmic, no gallops, rubs, or murmurs. No lower extremity edema. Pulmonary:  positive breath sounds bilaterally, adequate air movement, no wheezing, rhonchi or rales. Gastrointestinal. Abdomen with no organomegaly, non tender, no rebound or guarding Skin. Bilateral compression bandages to her lower extremities.  Musculoskeletal: no joint deformities    Labs on Admission: I have personally reviewed following labs and imaging studies  CBC: Recent Labs  Lab 11/19/18 0644  WBC 7.8  NEUTROABS 5.3  HGB 10.3*  HCT 32.4*  MCV 99.1  PLT 286   Basic Metabolic Panel: Recent Labs  Lab 11/19/18 0644  NA 139  K 4.1  CL 99  CO2 30  GLUCOSE 91  BUN 19  CREATININE 1.08*  CALCIUM 8.6*   GFR: CrCl cannot be calculated (Unknown ideal weight.). Liver Function Tests: No results for input(s): AST, ALT, ALKPHOS, BILITOT, PROT, ALBUMIN in the last 168 hours. No results for input(s): LIPASE, AMYLASE in the last 168 hours. No results for input(s): AMMONIA in the last 168 hours. Coagulation Profile: No results for input(s): INR, PROTIME in the last 168 hours. Cardiac Enzymes: No results for input(s): CKTOTAL, CKMB, CKMBINDEX, TROPONINI in the last 168 hours. BNP (last 3 results) No results for input(s): PROBNP in the last 8760 hours. HbA1C: No results for input(s): HGBA1C in the last 72 hours. CBG: No results for input(s): GLUCAP in the last 168 hours. Lipid Profile: No results for input(s):  CHOL, HDL, LDLCALC, TRIG, CHOLHDL, LDLDIRECT in the last 72 hours. Thyroid Function Tests: No results for input(s): TSH, T4TOTAL, FREET4, T3FREE, THYROIDAB in the last 72 hours. Anemia Panel: No results for input(s): VITAMINB12, FOLATE, FERRITIN, TIBC, IRON, RETICCTPCT in the last 72 hours. Urine analysis:    Component Value Date/Time   COLORURINE YELLOW 11/19/2018 0836   APPEARANCEUR CLEAR 11/19/2018 0836   LABSPEC 1.017 11/19/2018 0836   PHURINE 7.0 11/19/2018 0836   GLUCOSEU NEGATIVE 11/19/2018 0836   HGBUR NEGATIVE 11/19/2018 0836   BILIRUBINUR NEGATIVE 11/19/2018 0836   KETONESUR NEGATIVE 11/19/2018 0836   PROTEINUR NEGATIVE 11/19/2018 0836   UROBILINOGEN 0.2 10/30/2014 2242   NITRITE NEGATIVE 11/19/2018 0836   LEUKOCYTESUR NEGATIVE 11/19/2018 0836    Radiological Exams on Admission: Dg Chest 2 View  Result Date: 11/19/2018 CLINICAL DATA:  Fever EXAM: CHEST - 2 VIEW COMPARISON:  11/03/2018 FINDINGS: Cardiac enlargement with mild vascular congestion. Negative for pulmonary edema. Small bilateral pleural effusions similar to the prior study. Mild right lower lobe atelectasis. Negative for pneumonia. IMPRESSION: Small bilateral pleural effusions unchanged. Mild vascular congestion without edema Mild right lower lobe atelectasis. Electronically Signed   By: Marlan Palau M.D.   On: 11/19/2018 07:50    EKG: Independently reviewed. NA  Assessment/Plan Active Problems:   Renal failure  84 year old female who is a resident from a skilled nursing facility, she was brought to the hospital mainly because of fever, apparently she has been confused and disoriented.  Unable to get further details due to patient's confusion and lack of cooperation.  On her initial physical examination her temperature rectal 98.5 F, blood pressure 121/61, heart rate 85, respiratory rate 16, oxygen saturation 97%.  Dry mucous membranes, lungs clear to auscultation bilaterally, heart S1-S2 present and  rhythmic, abdomen soft nontender, lower extremities with compression bandages bilaterally.  Sodium 139, potassium 4.1, chloride 99, bicarb 30, glucose 91, BUN 19, creatinine 1.0 (baseline 0.55), white count 7.8 hemoglobin 10.3,(down from 12,5 on 11/03/2018), hematocrit 32.4, platelets 286, her urinalysis is negative for infection.  Fecal Hemoccult test was positive.  Chest radiograph with increased lung markings, left rotation, small bilateral  pleural effusions more right and left.   Patient will be admitted to the hospital with working diagnosis of acute kidney injury due to dehydration, complicated by metabolic encephalopathy and occult subacute GI bleed.  1.  Acute kidney injury due to due to dehydration, complicated by contraction alkalosis..  Patient will be admitted to the medical ward, will continue duration with IV fluids, balance electrolyte solutions, lactated Ringers at 75 mL/h.  We will encourage p.o. intake and follow-up kidney function in the morning.  Avoid hypotension and nephrotoxic agents.  Hold furosemide and spironolactone.  2.  Sub-acute gastrointestinal bleed, possible upper gastrointestinal tract, with acute blood loss anemia.  Patient had a 2 g hemoglobin drop from November 03, 2018, will continue close monitoring of hemoglobin and hematocrit, hold on rivaroxaban for now.  Considering patient's confusion, advanced age and comorbidities will hold on endoscopic procedures for now.  Currently no indications for PRBC transfusion.  Check iron panel. Add proton pump inhibitors.   3.  Metabolic encephalopathy.  Unclear baseline mentation, patient is disoriented times time and situation.  Will continue conservative management with hydration, monitor hemoglobin and hematocrit.  Neurochecks per unit protocol, physical therapy evaluation. Add thiamine and multivitamins.   4.  Chronic atrial fibrillation.  Continue rate control with carvedilol 6.25 mg twice daily, hold rivaroxaban due to drop  in hemoglobin. No telemetry monitoring for now.   5.  Hypertension.  Continue carvedilol, hold on hydralazine, furosemide and spironolactone to avoid further hypotension.  6.  Depression.  Continue sertraline.  7.  Hypothyroidism.  Continue levothyroxine.  DVT prophylaxis: scd  Code Status: DNR   Family Communication: no family at the bedside   Disposition Plan: med-surg Consults called:   Admission status: Observation     Zykeria Laguardia Annett Gula MD [facility] Pager [phone]  If 7PM-7AM, please contact night-coverage [email or web address] Password [email or web address]  11/19/2018, 9:51 AM

## 2018-11-19 NOTE — ED Notes (Signed)
Patient transported to X-ray 

## 2018-11-19 NOTE — ED Notes (Signed)
ED TO INPATIENT HANDOFF REPORT  Name/Age/Gender Brittany Schultz 84 y.o. female  Code Status Code Status History    Date Active Date Inactive Code Status Order ID Comments User Context   10/29/2018 2309 11/04/2018 1439 DNR [id number]  Kathrynn Running, MD Inpatient   10/30/2014 1912 11/02/2014 2311 DNR [id number]  Doree Albee, MD Inpatient    Questions for Most Recent Historical Code Status (Order [id number])    Question Answer Comment   In the event of cardiac or respiratory ARREST Do not call a "code blue"    In the event of cardiac or respiratory ARREST Do not perform Intubation, CPR, defibrillation or ACLS    In the event of cardiac or respiratory ARREST Use medication by any route, position, wound care, and other measures to relive pain and suffering. May use oxygen, suction and manual treatment of airway obstruction as needed for comfort.         Advance Directive Documentation     Most Recent Value  Type of Advance Directive  Out of facility DNR (pink MOST or yellow form), Living will, Healthcare Power of Attorney  Pre-existing out of facility DNR order (yellow form or pink MOST form)  -  "MOST" Form in Place?  -      Home/SNF/Other Home  Chief Complaint fever  Level of Care/Admitting Diagnosis ED Disposition    ED Disposition Condition Comment   Admit  Hospital Area: [facility] [[id number]]  Level of Care: Med-Surg [16]  Diagnosis: Renal failure [227997]  Admitting Physician: Coralie Keens [[id number]]  Attending Physician: Coralie Keens [[id number]]  PT Class (Do Not Modify): Observation [104]  PT Acc Code (Do Not Modify): Observation [10022]       Medical History Past Medical History:  Diagnosis Date  . A-fib [facility])    hospitalized 04/2009  . CHF (congestive heart failure) (HCC)   . Deaf   . Dermatitis 01/2014  . History of shingles   . Hx of colonic polyps    Adenomatous polyps  . Hyperlipidemia   . Hypertension   .  Lack of coordination   . Long term (current) use of anticoagulants    warfarin for stroke risk reduction re: AF  . Muscle weakness (generalized)   . Open wound of left lower leg 02/2104  . Osteoarthritis of left hip   . Other malaise and fatigue   . Peripheral vascular disease, unspecified (HCC)   . PVD (peripheral vascular disease) (HCC)   . Shortness of breath 03/23/14  . Thrombocytopenia (HCC) April 2015   144,000  . Unspecified hypothyroidism   . Unstable gait   . Varicose veins of lower extremities with ulcer (HCC)   . Venous insufficiency of both lower extremities    vascular evaluation 10/2013, 01/2014 Dr.Powell  . Venous stasis ulcer of left lower extremity (HCC)   . Vitamin D deficiency     Allergies Allergies  Allergen Reactions  . Sulfa Antibiotics Rash    Rash w/ sulfonomides furosemide, meloxicam  . Codeine     unknown  . Penicillins     unknown  . Sulfonylureas     unknown  . Clindamycin/Lincomycin Itching and Rash    IV Location/Drains/Wounds Patient Lines/Drains/Airways Status   Active Line/Drains/Airways    Name:   Placement date:   Placement time:   Site:   Days:   Peripheral IV 11/19/18 Right Wrist   11/19/18    0638    Wrist   less than 1  Wound / Incision (Open or Dehisced) 11/01/14 Leg Anterior;Left   11/01/14    1111    Leg   1479   Wound / Incision (Open or Dehisced) 11/01/14 Leg Posterior;Left   11/01/14    1113    Leg   1479   Wound / Incision (Open or Dehisced) 11/01/14 Leg Right;Circumferential   11/01/14    1115    Leg   1479   Wound / Incision (Open or Dehisced) 10/30/18 Venous stasis ulcer Leg Left   10/30/18    2005    Leg   20   Wound / Incision (Open or Dehisced) 10/30/18 Venous stasis ulcer Leg Right   10/30/18    2005    Leg   20          Labs/Imaging Results for orders placed or performed during the hospital encounter of 11/19/18 (from the past 48 hour(s))  CBC with Differential     Status: Abnormal   Collection Time: 11/19/18   6:44 AM  Result Value Ref Range   WBC 7.8 4.0 - 10.5 K/uL   RBC 3.27 (L) 3.87 - 5.11 MIL/uL   Hemoglobin 10.3 (L) 12.0 - 15.0 g/dL   HCT 30.9 (L) 40.7 - 68.0 %   MCV 99.1 80.0 - 100.0 fL   MCH 31.5 26.0 - 34.0 pg   MCHC 31.8 30.0 - 36.0 g/dL   RDW 88.1 (H) 10.3 - 15.9 %   Platelets 286 150 - 400 K/uL   nRBC 0.0 0.0 - 0.2 %   Neutrophils Relative % 68 %   Neutro Abs 5.3 1.7 - 7.7 K/uL   Lymphocytes Relative 16 %   Lymphs Abs 1.3 0.7 - 4.0 K/uL   Monocytes Relative 11 %   Monocytes Absolute 0.9 0.1 - 1.0 K/uL   Eosinophils Relative 3 %   Eosinophils Absolute 0.2 0.0 - 0.5 K/uL   Basophils Relative 1 %   Basophils Absolute 0.1 0.0 - 0.1 K/uL   Immature Granulocytes 1 %   Abs Immature Granulocytes 0.04 0.00 - 0.07 K/uL    Comment: Performed at [facility], [address] [place], [place] [address]  Basic metabolic panel     Status: Abnormal   Collection Time: 11/19/18  6:44 AM  Result Value Ref Range   Sodium 139 135 - 145 mmol/L   Potassium 4.1 3.5 - 5.1 mmol/L   Chloride 99 98 - 111 mmol/L   CO2 30 22 - 32 mmol/L   Glucose, Bld 91 70 - 99 mg/dL   BUN 19 8 - 23 mg/dL   Creatinine, Ser 2.92 (H) 0.44 - 1.00 mg/dL   Calcium 8.6 (L) 8.9 - 10.3 mg/dL   GFR calc non Af Amer 44 (L) >60 mL/min   GFR calc Af Amer 51 (L) >60 mL/min   Anion gap 10 5 - 15    Comment: Performed at [facility], [address], [place], [place] [address]  I-Stat CG4 Lactic Acid, ED     Status: None   Collection Time: 11/19/18  6:51 AM  Result Value Ref Range   Lactic Acid, Venous 1.05 0.5 - 1.9 mmol/L  Urinalysis, Routine w reflex microscopic     Status: None   Collection Time: 11/19/18  8:36 AM  Result Value Ref Range   Color, Urine YELLOW YELLOW   APPearance CLEAR CLEAR   Specific Gravity, Urine 1.017 1.005 - 1.030   pH 7.0 5.0 - 8.0   Glucose,  UA NEGATIVE NEGATIVE mg/dL   Hgb urine dipstick NEGATIVE NEGATIVE   Bilirubin Urine NEGATIVE NEGATIVE   Ketones,  ur NEGATIVE NEGATIVE mg/dL   Protein, ur NEGATIVE NEGATIVE mg/dL   Nitrite NEGATIVE NEGATIVE   Leukocytes, UA NEGATIVE NEGATIVE    Comment: Performed at [facility], [address], [place], [place] [address]  Influenza panel by PCR (type A & B)     Status: None   Collection Time: 11/19/18  8:36 AM  Result Value Ref Range   Influenza A By PCR NEGATIVE NEGATIVE   Influenza B By PCR NEGATIVE NEGATIVE    Comment: (NOTE) The Xpert Xpress Flu assay is intended as an aid in the diagnosis of  influenza and should not be used as a sole basis for treatment.  This  assay is FDA approved for nasopharyngeal swab specimens only. Nasal  washings and aspirates are unacceptable for Xpert Xpress Flu testing. Performed at [facility], [address] [place], [place] [address]   POC occult blood, ED     Status: Abnormal   Collection Time: 11/19/18  8:48 AM  Result Value Ref Range   Fecal Occult Bld POSITIVE (A) NEGATIVE   Dg Chest 2 View  Result Date: 11/19/2018 CLINICAL DATA:  Fever EXAM: CHEST - 2 VIEW COMPARISON:  11/03/2018 FINDINGS: Cardiac enlargement with mild vascular congestion. Negative for pulmonary edema. Small bilateral pleural effusions similar to the prior study. Mild right lower lobe atelectasis. Negative for pneumonia. IMPRESSION: Small bilateral pleural effusions unchanged. Mild vascular congestion without edema Mild right lower lobe atelectasis. Electronically Signed   By: Marlan Palau M.D.   On: 11/19/2018 07:50   None  Pending Labs Unresulted Labs (From admission, onward)    Start     Ordered   Signed and Held  CBC  (heparin)  Once,   R    Comments:  Baseline for heparin therapy IF NOT ALREADY DRAWN.  Notify MD if PLT < 100 K.    Signed and Held   Signed and Held  Creatinine, serum  (heparin)  Once,   R    Comments:  Baseline for heparin therapy IF NOT ALREADY DRAWN.    Signed and Held   Signed and Held  Basic metabolic panel   Tomorrow morning,   R     Signed and Held   Signed and Held  CBC  Tomorrow morning,   R     Signed and Held          Vitals/Pain Today's Vitals   11/19/18 0727 11/19/18 0840 11/19/18 0900 11/19/18 1000  BP:   127/73 (!) 141/82  Pulse:   70 91  Resp:   18 16  Temp:      TempSrc:      SpO2:   96% 96%  PainSc: 0-No pain 0-No pain      Isolation Precautions No active isolations  Medications Medications  sodium chloride 0.9 % bolus 500 mL (500 mLs Intravenous New Bag/Given (Non-Interop) 11/19/18 0830)    Mobility walks with device

## 2018-11-19 NOTE — Consult Note (Signed)
WOC Nurse wound consult note Patient receiving care in [facility].  Performed assessment and BLE care with assistance of primary RN, Aundra Millet. Reason for Consult: BLE compression wraps (3 layer) Wound type: Unclear etiology, although perhaps related to BLE edema, which is now resolved.  The patient states she has had these wounds on her legs for over a year.  Both legs had what appeared to be an alginate over the wound beds, ABD pads, then covered with a 3 layer compression wrap.  Each wrap had many creases along the entire length of the wraps. The entire circumferential right lower leg, gaiter and achilles areas, have multiple wounds many of which have a thin yellow film over them.  The surrounding skin is darkened, thin, and appears very fragile.  No odor was detected.   The left lower leg medial ankle area has two distinct shallow ulcers both with a thin yellow layer.  This leg was dressed in the same manner as described above.  Plan of care for both lower legs:  Cleanse wounds on bilateral lower legs with saline.  Apply enough Xeroform gauze [employer] [place] # [id number]) over the wounds to cover all of them.  Spiral wrap Kerlex and Ace wraps from just behind the toes to just below the knees.  Change daily.  This was done today at the time of my visit. Monitor the wound area(s) for worsening of condition such as: Signs/symptoms of infection,  Increase in size,  Development of or worsening of odor, Development of pain, or increased pain at the affected locations.  Notify the medical team if any of these develop.  Thank you for the consult.  Discussed plan of care with the patient and bedside nurse.  WOC nurse will not follow at this time.  Please re-consult the WOC team if needed.  Helmut Muster, RN, MSN, CWOCN, CNS-BC, pager [phone]

## 2018-11-19 NOTE — ED Notes (Signed)
Bed: YQ65 Expected date:  Expected time:  Means of arrival:  Comments: EMS 84 yo with fever 101 CBG 121 BP 124/70

## 2018-11-19 NOTE — ED Notes (Signed)
Pt has full rainbow blood draw including 1 set of blood cultures sent to lab

## 2018-11-19 NOTE — Care Management Note (Signed)
Case Management Note  CM noted pt's return from [facility].  Contacted Kathlene November with [facility] who pt has active [facility] services with to advise pt was being placed in OBS.  He will follow for needs.  Nirav Sweda, Lynnae Sandhoff, RN 11/19/2018, 10:31 AM

## 2018-11-20 DIAGNOSIS — R195 Other fecal abnormalities: Secondary | ICD-10-CM

## 2018-11-20 DIAGNOSIS — N179 Acute kidney failure, unspecified: Secondary | ICD-10-CM | POA: Diagnosis not present

## 2018-11-20 LAB — IRON AND TIBC
Iron: 42 ug/dL (ref 28–170)
Saturation Ratios: 17 % (ref 10.4–31.8)
TIBC: 248 ug/dL — ABNORMAL LOW (ref 250–450)
UIBC: 206 ug/dL

## 2018-11-20 LAB — CBC
HCT: 28.8 % — ABNORMAL LOW (ref 36.0–46.0)
Hemoglobin: 9.1 g/dL — ABNORMAL LOW (ref 12.0–15.0)
MCH: 31.6 pg (ref 26.0–34.0)
MCHC: 31.6 g/dL (ref 30.0–36.0)
MCV: 100 fL (ref 80.0–100.0)
Platelets: 265 10*3/uL (ref 150–400)
RBC: 2.88 MIL/uL — ABNORMAL LOW (ref 3.87–5.11)
RDW: 18.1 % — ABNORMAL HIGH (ref 11.5–15.5)
WBC: 6.7 10*3/uL (ref 4.0–10.5)
nRBC: 0 % (ref 0.0–0.2)

## 2018-11-20 LAB — BASIC METABOLIC PANEL
Anion gap: 11 (ref 5–15)
BUN: 16 mg/dL (ref 8–23)
CO2: 25 mmol/L (ref 22–32)
Calcium: 8 mg/dL — ABNORMAL LOW (ref 8.9–10.3)
Chloride: 103 mmol/L (ref 98–111)
Creatinine, Ser: 0.8 mg/dL (ref 0.44–1.00)
GFR calc Af Amer: >60 mL/min (ref >60)
GFR calc non Af Amer: >60 mL/min (ref >60)
Glucose, Bld: 84 mg/dL (ref 70–99)
Potassium: 3.7 mmol/L (ref 3.5–5.1)
Sodium: 139 mmol/L (ref 135–145)

## 2018-11-20 LAB — FERRITIN: Ferritin: 152 ng/mL (ref 11–307)

## 2018-11-20 LAB — TRANSFERRIN: Transferrin: 177 mg/dL — ABNORMAL LOW (ref 192–382)

## 2018-11-20 MED ORDER — ADULT MULTIVITAMIN LIQUID CH
15.0000 mL | Freq: Every day | ORAL | Status: DC
  Administered 2018-11-20: 15 mL via ORAL
  Filled 2018-11-20 (×2): qty 15

## 2018-11-20 NOTE — Progress Notes (Signed)
The POA, [name], for the pt wants to speak with the doctor regarding update and possible discharge. He can be reached at [phone]. Provider was notified.

## 2018-11-20 NOTE — Evaluation (Signed)
Physical Therapy Evaluation Patient Details Name: Brittany Schultz MRN: [id number] DOB: [birth year] Today's Date: 11/20/2018   History of Present Illness  84 y.o. female with medical history significant of hypertension, dyslipidemia, heart failure, atrial fibrillation, venous insufficiency and peripheral vascular disease.  Patient was brought to the hospital due to fever. She has been confused and disoriented at the skilled nursing facility. Dx AKI, dehydration, subacute GIB, metabolic encephalopathy.   Clinical Impression  Pt admitted with above diagnosis. Pt currently with functional limitations due to the deficits listed below (see PT Problem List). Max assist for supine to sit and for sit to stand. Unable to determine pt's baseline with mobility as pt is poor historian.  Pt will benefit from skilled PT to increase their independence and safety with mobility to allow discharge to the venue listed below.       Follow Up Recommendations SNF;Supervision/Assistance - 24 hour    Equipment Recommendations  None recommended by PT    Recommendations for Other Services       Precautions / Restrictions Precautions Precautions: Fall Restrictions Weight Bearing Restrictions: No      Mobility  Bed Mobility Overal bed mobility: Needs Assistance Bed Mobility: Rolling;Supine to Sit Rolling: Max assist   Supine to sit: Max assist;HOB elevated     General bed mobility comments: max A to raise trunk, used pad to pivot hips, posterior lean initially  Transfers Overall transfer level: Needs assistance Equipment used: Rolling walker (2 wheeled) Transfers: Sit to/from [employer] Sit to Stand: +2 physical assistance;Max assist;+2 safety/equipment Stand pivot transfers: Mod assist;+2 physical assistance;+2 safety/equipment       General transfer comment: assist to rise/steady, increased time to take a few pivotal steps to recliner  Ambulation/Gait                 Stairs            Wheelchair Mobility    Modified Rankin (Stroke Patients Only)       Balance Overall balance assessment: Needs assistance Sitting-balance support: Bilateral upper extremity supported;Feet supported Sitting balance-Leahy Scale: Poor Sitting balance - Comments: posterior lean initially, then able to maintain neutral   Standing balance support: Bilateral upper extremity supported Standing balance-Leahy Scale: Poor Standing balance comment: relies on BUE support                             Pertinent Vitals/Pain Faces Pain Scale: Hurts little more Pain Location: LEs with movement Pain Descriptors / Indicators: Grimacing;Guarding Pain Intervention(s): Limited activity within patient's tolerance;Monitored during session;Repositioned    Home Living Family/patient expects to be discharged to:: Assisted living                 Additional Comments: unknown, pt poor historian, seems familiar with a RW    Prior Function           Comments: pt poor historian 2* confusion, pt reported she uses a WC     Hand Dominance        Extremity/Trunk Assessment        Lower Extremity Assessment RLE Deficits / Details: AAROM limited ankle movement with decreased sensation, knee flexion about 35-40 degrees and hip flexion limited in sitting; strength grossly 3+/5 knee flexion 3/5 hip flexion;  lower legs wrapped  and with discoloration from chronic PVD LLE Deficits / Details: AAROM limited ankle movement with decreased sensation, knee flexion about 35-40 degrees and hip flexion limited  in sitting; strength grossly 3+/5 knee flexion 3/5 hip flexion; lower legs wrapped        Communication   Communication: HOH  Cognition Arousal/Alertness: Awake/alert Behavior During Therapy: WFL for tasks assessed/performed Overall Cognitive Status: No family/caregiver present to determine baseline cognitive functioning                                  General Comments: unreliable historian, oriented to self only, reports walked with walker and gets up to wheelchair on her own, but LE assessment reveals significant atrophy in lower legs and stiffness in joints      General Comments      Exercises     Assessment/Plan    PT Assessment Patient needs continued PT services  PT Problem List Decreased range of motion;Decreased strength;Decreased mobility;Decreased balance;Decreased knowledge of use of DME;Decreased activity tolerance       PT Treatment Interventions Functional mobility training;Balance training;Therapeutic exercise;Patient/family education;Therapeutic activities    PT Goals (Current goals can be found in the Care Plan section)  Acute Rehab PT Goals Patient Stated Goal: none stated, wants to go to "her room" PT Goal Formulation: Patient unable to participate in goal setting Time For Goal Achievement: 12/04/18 Potential to Achieve Goals: Fair    Frequency Min 2X/week   Barriers to discharge        Co-evaluation               AM-PAC PT "6 Clicks" Mobility  Outcome Measure Help needed turning from your back to your side while in a flat bed without using bedrails?: A Lot Help needed moving from lying on your back to sitting on the side of a flat bed without using bedrails?: A Lot Help needed moving to and from a bed to a chair (including a wheelchair)?: A Lot Help needed standing up from a chair using your arms (e.g., wheelchair or bedside chair)?: A Lot Help needed to walk in hospital room?: Total Help needed climbing 3-5 steps with a railing? : Total 6 Click Score: 10    End of Session Equipment Utilized During Treatment: Gait belt Activity Tolerance: Patient tolerated treatment well Patient left: with call bell/phone within reach;in chair;with chair alarm set Nurse Communication: Mobility status PT Visit Diagnosis: Difficulty in walking, not elsewhere classified (R26.2);Muscle weakness  (generalized) (M62.81)    Time: 1751-0258 PT Time Calculation (min) (ACUTE ONLY): 16 min   Charges:   PT Evaluation $PT Eval Moderate Complexity: 1 Mod          Tamala Ser PT 11/20/2018  Acute Rehabilitation Services Pager [phone] Office [phone]

## 2018-11-20 NOTE — Progress Notes (Signed)
PROGRESS NOTE    Brittany Schultz  MOQ:[id number] DOB: [birth year] DOA: 11/19/2018 PCP: Angela Cox, MD   Brief Narrative: HPI per Dr. Erin Hearing on 11/19/18 Brittany Schultz is a 84 y.o. female with medical history significant of hypertension, dyslipidemia, heart failure, atrial fibrillation, venous insufficiency and peripheral vascular disease.  Patient was brought to the hospital due to fever.  Apparently she has been confused and disoriented at the skilled nursing facility.  I was not able to reach her granddaughter over the phone to get further information, patient is disoriented, at times agitated, not cooperative with the interrogation, and not giving detailed information.  ED Course: patient was found confused and disorientated, with double serum creatinine, drop 2 grams of hb and positive hem occult stools. Referred for admission and further evaluation.   **Examined at bedside still confused.  No complaints.  Had GI evaluate the patient today because of her FOBT positive and drop in hemoglobin.  Assessment & Plan:   Active Problems:   Renal failure  Acute kidney injury due to due to dehydration, complicated by contraction alkalosis.. -Patient was admitted to the medical ward -C/w IVF Rehydration with Lactated Ringers at 75 mL/hr and will stop later today -We will encourage p.o. intake  -Avoid hypotension and nephrotoxic agents.   -Hold furosemide and spironolactone for now and resume tomorrow if stable -Patient's BUN/Cr went from 19/1.08 -> 16/0.80 -Continue to Monitor and Repeat CMP in AM  Sub-acute gastrointestinal bleed, possible upper gastrointestinal tract, with acute blood loss anemia -Patient had a 2 g hemoglobin drop from November 03, 2018,  -will continue close monitoring of hemoglobin and hematocrit, hold on rivaroxaban for now.   -Considering patient's confusion, advanced age and comorbidities will hold on endoscopic procedures for now but will  still get GI evaluation.   -Currently no indications for PRBC transfusion.   -Check Anemia Panel and an iron level 42, U IBC of 206, TIBC of 248, saturation ratio 17%, ferritin level 152, transferrin level 177.  -Hb/Hct has now drown to 9.1/28.8 -Added proton pump inhibitors and will continue.  -Repeat CBC in AM   Metabolic Encephalopathy -Unclear baseline mentation -Patient is disoriented times time and situation on admission but slight improved.   -Will continue conservative management with hydration, monitor hemoglobin and hematocrit. -Neurochecks per unit protocol, physical therapy evaluation.  -U/A Negative  -Add thiamine and multivitamins.  -Delirium Precautions  Chronic Atrial Fibrillation -Continue rate control with Carvedilol 6.25 mg twice daily,  -Hold Rivaroxaban due to drop in hemoglobin. No telemetry monitoring for now.   Hypertension -Continue carvedilol -Currently Holding on hydralazine, furosemide and spironolactone to avoid further hypotension.  Depression -Continue Sertraline.  Hypothyroidism -Continue Levothyroxine.  DVT prophylaxis: SCDs Code Status: DO NOT RESUSCITATE Family Communication: No family present at bedside Disposition Plan: Anticipate D/C Back to SNF in the next 24-48 hours pending stable Hb and improvement in mentation to baseline  Consultants:   Gastroenterology Dr. Randa Evens   Procedures:  None   Antimicrobials:  Anti-infectives (From admission, onward)   None     Subjective: Examined at bedside about eating this morning.  Denies any complaints but was confused somewhat and slightly agitated.  Nursing reports no visible blood.  No chest pain, lightheadedness or dizziness.  No other concerns or complaints at this time.  Objective: Vitals:   11/19/18 1226 11/19/18 2142 11/20/18 0549 11/20/18 1352  BP:  136/79 126/66 127/76  Pulse:  81 74 99  Resp:  16 20 16  Temp:  98 F (36.7 C) 97.9 F (36.6 C) 97.7 F (36.5 C)    TempSrc:  Oral Oral Oral  SpO2:  92% 93% 98%  Weight: 71.2 kg     Height: 5\' 4"  (1.626 m)       Intake/Output Summary (Last 24 hours) at 11/20/2018 2022 Last data filed at 11/20/2018 1831 Gross per 24 hour  Intake 1040.53 ml  Output 900 ml  Net 140.53 ml   Filed Weights   11/19/18 1226  Weight: 71.2 kg   Examination: Physical Exam:  Constitutional: WN/WD elderly Caucasian female in NAD and appears calm and comfortable Eyes: Lids and conjunctivae normal, sclerae anicteric  ENMT: External Ears, Nose appear normal. Grossly normal hearing. Neck: Appears normal, supple, no cervical masses, normal ROM, no appreciable thyromegaly; no JVD Respiratory: Diminished to auscultation bilaterally, no wheezing, rales, rhonchi or crackles. Normal respiratory effort and patient is not tachypenic. No accessory muscle use.  Cardiovascular: Irregularly Irregular, no murmurs / rubs / gallops. S1 and S2 auscultated. No extremity edema.  Abdomen: Soft, non-tender, non-distended. No masses palpated. No appreciable hepatosplenomegaly. Bowel sounds positive x4.  GU: Deferred. Musculoskeletal: No clubbing / cyanosis of digits/nails. Normal strength and muscle tone.  Skin: No rashes, lesions, ulcers on a limited skin evaluation. No induration; Warm and dry.  Neurologic: CN 2-12 grossly intact with no focal deficits. Romberg sign and cerebellar reflexes not assessed.  Psychiatric: Impaired judgment and insight. Alert and oriented x 3. Normal mood and appropriate affect.   Data Reviewed: I have personally reviewed following labs and imaging studies  CBC: Recent Labs  Lab 11/19/18 0644 11/20/18 0519  WBC 7.8 6.7  NEUTROABS 5.3  --   HGB 10.3* 9.1*  HCT 32.4* 28.8*  MCV 99.1 100.0  PLT 286 265   Basic Metabolic Panel: Recent Labs  Lab 11/19/18 0644 11/20/18 0519  NA 139 139  K 4.1 3.7  CL 99 103  CO2 30 25  GLUCOSE 91 84  BUN 19 16  CREATININE 1.08* 0.80  CALCIUM 8.6* 8.0*    GFR: Estimated Creatinine Clearance: 41.6 mL/min (by C-G formula based on SCr of 0.8 mg/dL). Liver Function Tests: No results for input(s): AST, ALT, ALKPHOS, BILITOT, PROT, ALBUMIN in the last 168 hours. No results for input(s): LIPASE, AMYLASE in the last 168 hours. No results for input(s): AMMONIA in the last 168 hours. Coagulation Profile: No results for input(s): INR, PROTIME in the last 168 hours. Cardiac Enzymes: No results for input(s): CKTOTAL, CKMB, CKMBINDEX, TROPONINI in the last 168 hours. BNP (last 3 results) No results for input(s): PROBNP in the last 8760 hours. HbA1C: No results for input(s): HGBA1C in the last 72 hours. CBG: No results for input(s): GLUCAP in the last 168 hours. Lipid Profile: No results for input(s): CHOL, HDL, LDLCALC, TRIG, CHOLHDL, LDLDIRECT in the last 72 hours. Thyroid Function Tests: No results for input(s): TSH, T4TOTAL, FREET4, T3FREE, THYROIDAB in the last 72 hours. Anemia Panel: Recent Labs    11/20/18 0519  FERRITIN 152  TIBC 248*  IRON 42   Sepsis Labs: Recent Labs  Lab 11/19/18 0651  LATICACIDVEN 1.05    No results found for this or any previous visit (from the past 240 hour(s)).   Radiology Studies: Dg Chest 2 View  Result Date: 11/19/2018 CLINICAL DATA:  Fever EXAM: CHEST - 2 VIEW COMPARISON:  11/03/2018 FINDINGS: Cardiac enlargement with mild vascular congestion. Negative for pulmonary edema. Small bilateral pleural effusions similar to the prior study. Mild right  lower lobe atelectasis. Negative for pneumonia. IMPRESSION: Small bilateral pleural effusions unchanged. Mild vascular congestion without edema Mild right lower lobe atelectasis. Electronically Signed   By: Marlan Palau M.D.   On: 11/19/2018 07:50   Scheduled Meds: . carvedilol  6.25 mg Oral Q breakfast  . feeding supplement (ENSURE ENLIVE)  237 mL Oral Q24H  . levothyroxine  100 mcg Oral QHS  . [START ON 11/22/2018] Mineral Oil  2 drop Both EARS Weekly   . multivitamin  15 mL Oral Daily  . pantoprazole  40 mg Oral Daily  . sertraline  75 mg Oral Daily  . thiamine  100 mg Oral Daily  . traMADol  50 mg Oral BID  . vitamin B-12  1,000 mcg Oral Daily   Continuous Infusions: . dextrose 5% lactated ringers Stopped (11/20/18 0146)    LOS: 0 days   Merlene Laughter, DO [facility] PAGER is on AMION  If 7PM-7AM, please contact night-coverage [email or web address] Password [email or web address] 11/20/2018, 8:22 PM

## 2018-11-20 NOTE — Consult Note (Signed)
[facility] CONSULT Reason for consult: Guaiac positive stools Referring Physician: [facility] hospitalist  Brittany Schultz is an 84 y.o. female.  HPI: She is a resident of SNF and became confused and apparently had a fever.  She was disoriented.  She was admitted for this reason and as part of her work-up she was found to have a drop in hemoglobin from 12.5-10.3 without gross signs of acute bleeding.Her stool was checked and was positive for occult blood utilizing the guaiac test.  Patient apparently has had polyps in the past cannot really find any type of colonoscopy note on her.  She has been on anticoagulants because of PAF.  She was on Xarelto on admission.  Because of her positive stools we are asked to see her.  It does not appear from reading the records that she was on acid reduction.  Past Medical History:  Diagnosis Date  . A-fib [facility])    hospitalized 04/2009  . CHF (congestive heart failure) (HCC)   . Deaf   . Dermatitis 01/2014  . History of shingles   . Hx of colonic polyps    Adenomatous polyps  . Hyperlipidemia   . Hypertension   . Lack of coordination   . Long term (current) use of anticoagulants    warfarin for stroke risk reduction re: AF  . Muscle weakness (generalized)   . Open wound of left lower leg 02/2104  . Osteoarthritis of left hip   . Other malaise and fatigue   . Peripheral vascular disease, unspecified (HCC)   . PVD (peripheral vascular disease) (HCC)   . Shortness of breath 03/23/14  . Thrombocytopenia (HCC) April 2015   144,000  . Unspecified hypothyroidism   . Unstable gait   . Varicose veins of lower extremities with ulcer (HCC)   . Venous insufficiency of both lower extremities    vascular evaluation 10/2013, 01/2014 Dr.Powell  . Venous stasis ulcer of left lower extremity (HCC)   . Vitamin D deficiency     Past Surgical History:  Procedure Laterality Date  . CATARACT EXTRACTION W/ INTRAOCULAR LENS  IMPLANT, BILATERAL Bilateral      Family History  Problem Relation Age of Onset  . Hypertension Mother   . Stroke Mother   . Heart disease Mother   . Hypertension Father   . Hypertension Brother     Social History:  reports that she has never smoked. She has never used smokeless tobacco. She reports that she does not drink alcohol or use drugs.  Allergies:  Allergies  Allergen Reactions  . Sulfa Antibiotics Rash    Rash w/ sulfonomides furosemide, meloxicam  . Codeine     unknown  . Penicillins     unknown  . Sulfonylureas     unknown  . Clindamycin/Lincomycin Itching and Rash    Medications; Prior to Admission medications   Medication Sig Start Date End Date Taking? Authorizing Provider  acetaminophen (TYLENOL) 325 MG tablet Take 650 mg by mouth 3 (three) times daily.  03/17/14  Yes Krell, Claudette T, NP  albuterol (PROVENTIL) (2.5 MG/3ML) 0.083% nebulizer solution Take 3 mLs (2.5 mg total) by nebulization every 6 (six) hours as needed for wheezing or shortness of breath. 11/04/18  Yes Sheikh, Omair Latif, DO  carvedilol (COREG) 6.25 MG tablet Take 6.25 mg by mouth daily.    Yes [provider]  diclofenac sodium (VOLTAREN) 1 % GEL Apply 2 g topically 3 (three) times daily. Both lower extremities 06/25/18  Yes [provider]  furosemide (LASIX) 40 MG tablet Take 1 tablet (40 mg total) by mouth daily. 11/02/14  Yes Leroy Sea, MD  hydrALAZINE (APRESOLINE) 25 MG tablet Take 1 tablet (25 mg total) by mouth every 8 (eight) hours. Patient taking differently: Take 25 mg by mouth every 8 (eight) hours.  11/02/14  Yes Leroy Sea, MD  hydrOXYzine (ATARAX/VISTARIL) 25 MG tablet Take 1 tablet (25 mg total) by mouth every 8 (eight) hours as needed. 04/20/14  Yes Krell, Claudette T, NP  levothyroxine (SYNTHROID, LEVOTHROID) 100 MCG tablet Take 100 mcg by mouth at bedtime.    Yes [provider]  lip balm (CARMEX) ointment Apply topically as needed for lip care. 11/03/18  Yes  Sheikh, Omair Latif, DO  Nutritional Supplements (JUICE PLUS FIBRE PO) Take 1 capsule by mouth 3 (three) times daily. Take one capsule of each three times a day [employer]   Yes [provider]  potassium chloride SA (K-DUR,KLOR-CON) 20 MEQ tablet Take 20 mEq by mouth daily. 07/01/18  Yes [provider]  rivaroxaban (XARELTO) 20 MG TABS tablet Take 20 mg by mouth daily.    Yes [provider]  sertraline (ZOLOFT) 50 MG tablet Take 75 mg by mouth daily.  10/07/18  Yes [provider]  spironolactone (ALDACTONE) 25 MG tablet Take 1 tablet (25 mg total) by mouth daily. 04/20/14  Yes Krell, Claudette T, NP  traMADol (ULTRAM) 50 MG tablet Take 1 tablet (50 mg total) by mouth as directed. Take 1 tablet BID & Take 1 tablet daily PRN for Pain. 11/03/18  Yes Sheikh, Omair Latif, DO  vitamin B-12 (CYANOCOBALAMIN) 1000 MCG tablet Take 1,000 mcg by mouth daily.   Yes [provider]  VITAMIN D, ERGOCALCIFEROL, PO Take 1,000 Units by mouth daily.    Yes [provider]  Zinc Oxide (DESITIN) 13 % CREA Apply 1 application topically 2 (two) times daily.   Yes [provider]  mineral oil external liquid Place 2 drops into both ears once a week.    [provider]  predniSONE (STERAPRED UNI-PAK 21 TAB) 10 MG (21) TBPK tablet Take 6 tablets on day 1, 5 tablets on day 2, 4 tablets on day 3, 3 tablets on day 4, 2 tablets on day 5, 1 tablet day 6 and stop on day 7 Patient not taking: Reported on 11/19/2018 11/04/18   Marguerita Merles Latif, DO   . carvedilol  6.25 mg Oral Q breakfast  . feeding supplement (ENSURE ENLIVE)  237 mL Oral Q24H  . levothyroxine  100 mcg Oral QHS  . [START ON 11/22/2018] Mineral Oil  2 drop Both EARS Weekly  . multivitamin  15 mL Oral Daily  . pantoprazole  40 mg Oral Daily  . sertraline  75 mg Oral Daily  . thiamine  100 mg Oral Daily  . traMADol  50 mg Oral BID  . vitamin B-12  1,000 mcg  Oral Daily   PRN Meds acetaminophen **OR** acetaminophen, albuterol, ondansetron **OR** ondansetron (ZOFRAN) IV, traMADol Results for orders placed or performed during the hospital encounter of 11/19/18 (from the past 48 hour(s))  CBC with Differential     Status: Abnormal   Collection Time: 11/19/18  6:44 AM  Result Value Ref Range   WBC 7.8 4.0 - 10.5 K/uL   RBC 3.27 (L) 3.87 - 5.11 MIL/uL   Hemoglobin 10.3 (L) 12.0 - 15.0 g/dL   HCT 03.7 (L) 04.8 - 88.9 %  MCV 99.1 80.0 - 100.0 fL   MCH 31.5 26.0 - 34.0 pg   MCHC 31.8 30.0 - 36.0 g/dL   RDW 13.0 (H) 86.5 - 78.4 %   Platelets 286 150 - 400 K/uL   nRBC 0.0 0.0 - 0.2 %   Neutrophils Relative % 68 %   Neutro Abs 5.3 1.7 - 7.7 K/uL   Lymphocytes Relative 16 %   Lymphs Abs 1.3 0.7 - 4.0 K/uL   Monocytes Relative 11 %   Monocytes Absolute 0.9 0.1 - 1.0 K/uL   Eosinophils Relative 3 %   Eosinophils Absolute 0.2 0.0 - 0.5 K/uL   Basophils Relative 1 %   Basophils Absolute 0.1 0.0 - 0.1 K/uL   Immature Granulocytes 1 %   Abs Immature Granulocytes 0.04 0.00 - 0.07 K/uL    Comment: Performed at [facility], [address] [place], [place] [address]  Basic metabolic panel     Status: Abnormal   Collection Time: 11/19/18  6:44 AM  Result Value Ref Range   Sodium 139 135 - 145 mmol/L   Potassium 4.1 3.5 - 5.1 mmol/L   Chloride 99 98 - 111 mmol/L   CO2 30 22 - 32 mmol/L   Glucose, Bld 91 70 - 99 mg/dL   BUN 19 8 - 23 mg/dL   Creatinine, Ser 5.28 (H) 0.44 - 1.00 mg/dL   Calcium 8.6 (L) 8.9 - 10.3 mg/dL   GFR calc non Af Amer 44 (L) >60 mL/min   GFR calc Af Amer 51 (L) >60 mL/min   Anion gap 10 5 - 15    Comment: Performed at [facility], [address] [place], [place] [address]  I-Stat CG4 Lactic Acid, ED     Status: None   Collection Time: 11/19/18  6:51 AM  Result Value Ref Range   Lactic Acid, Venous 1.05 0.5 - 1.9 mmol/L  Urinalysis, Routine w reflex microscopic     Status: None    Collection Time: 11/19/18  8:36 AM  Result Value Ref Range   Color, Urine YELLOW YELLOW   APPearance CLEAR CLEAR   Specific Gravity, Urine 1.017 1.005 - 1.030   pH 7.0 5.0 - 8.0   Glucose, UA NEGATIVE NEGATIVE mg/dL   Hgb urine dipstick NEGATIVE NEGATIVE   Bilirubin Urine NEGATIVE NEGATIVE   Ketones, ur NEGATIVE NEGATIVE mg/dL   Protein, ur NEGATIVE NEGATIVE mg/dL   Nitrite NEGATIVE NEGATIVE   Leukocytes, UA NEGATIVE NEGATIVE    Comment: Performed at [facility], [address], [place], [place] [address]  Influenza panel by PCR (type A & B)     Status: None   Collection Time: 11/19/18  8:36 AM  Result Value Ref Range   Influenza A By PCR NEGATIVE NEGATIVE   Influenza B By PCR NEGATIVE NEGATIVE    Comment: (NOTE) The Xpert Xpress Flu assay is intended as an aid in the diagnosis of  influenza and should not be used as a sole basis for treatment.  This  assay is FDA approved for nasopharyngeal swab specimens only. Nasal  washings and aspirates are unacceptable for Xpert Xpress Flu testing. Performed at [facility], [address], [place], [place] [address]   POC occult blood, ED     Status: Abnormal   Collection Time: 11/19/18  8:48 AM  Result Value Ref Range   Fecal Occult Bld POSITIVE (A) NEGATIVE  Basic metabolic panel     Status: Abnormal  Collection Time: 11/20/18  5:19 AM  Result Value Ref Range   Sodium 139 135 - 145 mmol/L   Potassium 3.7 3.5 - 5.1 mmol/L   Chloride 103 98 - 111 mmol/L   CO2 25 22 - 32 mmol/L   Glucose, Bld 84 70 - 99 mg/dL   BUN 16 8 - 23 mg/dL   Creatinine, Ser 5.59 0.44 - 1.00 mg/dL   Calcium 8.0 (L) 8.9 - 10.3 mg/dL   GFR calc non Af Amer >60 >60 mL/min   GFR calc Af Amer >60 >60 mL/min   Anion gap 11 5 - 15    Comment: Performed at [facility], [address], [place], [place] [address]  CBC     Status: Abnormal   Collection Time: 11/20/18  5:19 AM  Result Value Ref Range   WBC  6.7 4.0 - 10.5 K/uL   RBC 2.88 (L) 3.87 - 5.11 MIL/uL   Hemoglobin 9.1 (L) 12.0 - 15.0 g/dL   HCT 84.5 (L) 36.4 - 68.0 %   MCV 100.0 80.0 - 100.0 fL   MCH 31.6 26.0 - 34.0 pg   MCHC 31.6 30.0 - 36.0 g/dL   RDW 32.1 (H) 22.4 - 82.5 %   Platelets 265 150 - 400 K/uL   nRBC 0.0 0.0 - 0.2 %    Comment: Performed at [facility], [address] [place], [place] [address]  Iron and TIBC     Status: Abnormal   Collection Time: 11/20/18  5:19 AM  Result Value Ref Range   Iron 42 28 - 170 ug/dL   TIBC 488 (L) 891 - 694 ug/dL   Saturation Ratios 17 10.4 - 31.8 %   UIBC 206 ug/dL    Comment: Performed at [facility], [address], [place], [place] [address]  Ferritin     Status: None   Collection Time: 11/20/18  5:19 AM  Result Value Ref Range   Ferritin 152 11 - 307 ng/mL    Comment: Performed at [facility], [address], [place], [place] [address]  Transferrin     Status: Abnormal   Collection Time: 11/20/18  5:19 AM  Result Value Ref Range   Transferrin 177 (L) 192 - 382 mg/dL    Comment: Performed at [facility], [address] [place], [place] [address]    Dg Chest 2 View  Result Date: 11/19/2018 CLINICAL DATA:  Fever EXAM: CHEST - 2 VIEW COMPARISON:  11/03/2018 FINDINGS: Cardiac enlargement with mild vascular congestion. Negative for pulmonary edema. Small bilateral pleural effusions similar to the prior study. Mild right lower lobe atelectasis. Negative for pneumonia. IMPRESSION: Small bilateral pleural effusions unchanged. Mild vascular congestion without edema Mild right lower lobe atelectasis. Electronically Signed   By: Marlan Palau M.D.   On: 11/19/2018 07:50   ROS: Unobtainable             Blood pressure 127/76, pulse 99, temperature 97.7 F (36.5 C), temperature source Oral, resp. rate 16, height 5\' 4"  (1.626 m), weight 71.2 kg, SpO2 98 %.  Physical exam:   General--elderly  white female whom is confused and nonresponsive.  She smiles but makes no understandable response to questions ENT--nonicteric Neck--stiff Heart--tachycardic Lungs--clear Abdomen--soft and nontender Psych--   Assessment: 1.  Trace positive stools.  Patient is not having any gross bleeding and is on anticoagulation as an outpatient.  She is not on any acid reducing medications.  I think  treating her conservatively would be best.  I would hold any endoscopic procedures unless there is active bleeding. 2.  History of PAF with chronic anticoagulation 3.  Confusion and encephalopathy of unclear cause  Plan: Agree with treating empirically with PPI.  If she develops active bleeding please call us and we will readdress the issue of endoscopy.  I would hold her anticoagulation for now and if she is stable you can readdress whether or not to resume it in several days. We will sign off but please call us for further problems   Tresea Mall 11/20/2018, 3:47 PM   This note was created using voice recognition software and minor errors may Have occurred unintentionally. Pager: [phone] If no answer or after hours call [phone]

## 2018-11-21 DIAGNOSIS — N179 Acute kidney failure, unspecified: Secondary | ICD-10-CM | POA: Diagnosis not present

## 2018-11-21 LAB — CBC WITH DIFFERENTIAL/PLATELET
Abs Immature Granulocytes: 0.03 10*3/uL (ref 0.00–0.07)
Basophils Absolute: 0.1 10*3/uL (ref 0.0–0.1)
Basophils Relative: 1 %
Eosinophils Absolute: 0.3 10*3/uL (ref 0.0–0.5)
Eosinophils Relative: 3 %
HCT: 32.3 % — ABNORMAL LOW (ref 36.0–46.0)
Hemoglobin: 10.1 g/dL — ABNORMAL LOW (ref 12.0–15.0)
Immature Granulocytes: 0 %
LYMPHS ABS: 1 10*3/uL (ref 0.7–4.0)
Lymphocytes Relative: 13 %
MCH: 31.5 pg (ref 26.0–34.0)
MCHC: 31.3 g/dL (ref 30.0–36.0)
MCV: 100.6 fL — ABNORMAL HIGH (ref 80.0–100.0)
Monocytes Absolute: 0.6 10*3/uL (ref 0.1–1.0)
Monocytes Relative: 9 %
Neutro Abs: 5.4 10*3/uL (ref 1.7–7.7)
Neutrophils Relative %: 74 %
Platelets: 267 10*3/uL (ref 150–400)
RBC: 3.21 MIL/uL — ABNORMAL LOW (ref 3.87–5.11)
RDW: 18.2 % — ABNORMAL HIGH (ref 11.5–15.5)
WBC: 7.3 10*3/uL (ref 4.0–10.5)
nRBC: 0 % (ref 0.0–0.2)

## 2018-11-21 LAB — COMPREHENSIVE METABOLIC PANEL
ALT: 12 U/L (ref 0–44)
AST: 17 U/L (ref 15–41)
Albumin: 3.1 g/dL — ABNORMAL LOW (ref 3.5–5.0)
Alkaline Phosphatase: 65 U/L (ref 38–126)
Anion gap: 11 (ref 5–15)
BUN: 16 mg/dL (ref 8–23)
CO2: 25 mmol/L (ref 22–32)
Calcium: 8.3 mg/dL — ABNORMAL LOW (ref 8.9–10.3)
Chloride: 103 mmol/L (ref 98–111)
Creatinine, Ser: 0.73 mg/dL (ref 0.44–1.00)
GFR calc Af Amer: >60 mL/min (ref >60)
GFR calc non Af Amer: >60 mL/min (ref >60)
Glucose, Bld: 83 mg/dL (ref 70–99)
POTASSIUM: 3.8 mmol/L (ref 3.5–5.1)
Sodium: 139 mmol/L (ref 135–145)
Total Bilirubin: 1.2 mg/dL (ref 0.3–1.2)
Total Protein: 6.6 g/dL (ref 6.5–8.1)

## 2018-11-21 LAB — MAGNESIUM: Magnesium: 2.2 mg/dL (ref 1.7–2.4)

## 2018-11-21 LAB — PHOSPHORUS: Phosphorus: 2.9 mg/dL (ref 2.5–4.6)

## 2018-11-21 MED ORDER — THIAMINE HCL 100 MG PO TABS: 100.0000 mg | ORAL_TABLET | Freq: Every day | ORAL | 0 refills | Status: DC

## 2018-11-21 MED ORDER — TRAMADOL HCL 50 MG PO TABS: 50.0000 mg | ORAL_TABLET | ORAL | 0 refills | Status: DC

## 2018-11-21 MED ORDER — HALOPERIDOL LACTATE 5 MG/ML IJ SOLN
5.0000 mg | Freq: Once | INTRAMUSCULAR | Status: AC
  Administered 2018-11-21: 5 mg via INTRAVENOUS
  Filled 2018-11-21: qty 1

## 2018-11-21 MED ORDER — RIVAROXABAN 20 MG PO TABS: 20.0000 mg | ORAL_TABLET | Freq: Every day | ORAL | Status: DC

## 2018-11-21 MED ORDER — ADULT MULTIVITAMIN LIQUID CH: 15.0000 mL | Freq: Every day | ORAL | 0 refills | Status: DC

## 2018-11-21 MED ORDER — PANTOPRAZOLE SODIUM 40 MG PO TBEC: 40.0000 mg | DELAYED_RELEASE_TABLET | Freq: Every day | ORAL | 0 refills | Status: DC

## 2018-11-21 NOTE — Progress Notes (Signed)
Patient discharged to [facility]. All discharge medication and instructions sent to facility via [facility].

## 2018-11-21 NOTE — Discharge Summary (Signed)
Physician Discharge Summary  Brittany Schultz NMM:[id number] DOB: May 10, 1922 DOA: 11/19/2018  PCP: Angela Cox, MD  Admit date: 11/19/2018 Discharge date: 11/21/2018  Admitted From: ALF Disposition: ALF with Home Health PT/OT/RN/Aide/SW  Recommendations for Outpatient Follow-up:  1. Follow up with PCP in 1-2 weeks 2. Follow up with Cardiology in 1 week to see if Eliquis will need to be resumed 3. Follow-up with Gastroenterology Dr. Randa Evens in outpatient setting if necessary 4. Monitor for S/Sx of Bleeding 5. Please obtain CMP/CBC, Mag, Phos in one week 6. Please follow up on the following pending results:  Home Health: Yes Equipment/Devices: None Recommended by PT    Discharge Condition: Stable CODE STATUS: DO NOT RESUSCITATE Diet recommendation: Heart Healthy Diet  Brief/Interim Summary: HPI per Dr. Delrae Sawyers Arrien on 11/19/18 Brittany B Braswellis a 84 y.o.femalewith medical history significant ofhypertension, dyslipidemia, heart failure, atrial fibrillation, venous insufficiency and peripheral vascular disease. Patient was brought to the hospital due to fever. Apparently she has been confused and disoriented at the skilled nursing facility.  I was not able to reach her granddaughter over the phone to get further information, patient is disoriented, at times agitated, not cooperative with the interrogation,and not giving detailed information.  ED Course:patient was found confused and disorientated, with double serum creatinine, drop 2 grams of hb and positive hem occult stools. Referred for admission and further evaluation.  **Examined at bedside and was still confused yesterday and today was somnolent and drowsy and sleeping.  Had GI evaluate the patient today because of her FOBT positive and drop in hemoglobin but will not purse invasive endoscopy at this time. Will hold Anticoagulation and have her follow up with PCP/Cardiology within 1 week and defer to them to  resume.  I discussed with the Healthcare power of attorney and he feels that the best option would be to hold her Xarelto on transfer back to her skilled nursing facility as she is more familiar in the environment.  He is okay with holding and having PCP or cardiology resume in the outpatient setting.  Work-up revealed no source of infection and patient does have some hospital delirium and sundowning last night.  She is currently medically stable to be discharged and will need to follow-up with PCP, Cardiology, and and gastroenterology in outpatient setting if necessary.  Discharge Diagnoses:  Active Problems:   Renal failure  Acute kidney injury due to due to dehydration,complicated by contraction alkalosis.. -Patient was admitted to the medical ward -C/w IVF Rehydration with Lactated Ringers at 75 mL/hr and now stopped -We will encourage p.o. intake  -Avoid hypotension and nephrotoxic agents.  -Held furosemide and spironolactone for now and resume tomorrow if stable -Patient's BUN/Cr went from 19/1.08 -> 16/0.80 -> 16/0.73 -IVF now discontinued  -Continue to Monitor and Repeat CMP as an outpatient  Sub-acute gastrointestinal bleed, possible upper gastrointestinal tract,with acute blood loss anemia, now stable -Patient had a 2 g hemoglobin drop from November 03, 2018,  -will continue close monitoring of hemoglobin and hematocrit, hold on rivaroxaban for now and defer to PCP/Cardiology to resume in 1 week (11/28/18) -Considering patient's confusion, advanced age and comorbidities will hold on endoscopic procedures for now but will still get GI evaluation.  -Currently no indications for PRBC transfusion.  -Check Anemia Panel and an iron level 42, U IBC of 206, TIBC of 248, saturation ratio 17%, ferritin level 152, transferrin level 177.  -Hb/Hct had now gone to 9.1/28.8 and is improved today to 10.1/32.3  -Added proton pump  inhibitors and will continue at D/C -Repeat CBC at facility  within 1 week   Metabolic Encephalopathy and likely Hospital Delirium -Unclear baseline mentation but patient's HCPOA states she becomes disor -Patient is disoriented times time and situation on admission but slight improved.  -Will continue conservative management with hydration, monitor hemoglobin and hematocrit.  -Neurochecks per unit protocol -Thysical therapy evaluation recommending SNF but HCPOA wants to take her Back to ALF with Therapy.  -U/A Negative  -Add thiamine and multivitamins.  -Delirium Precautions -No Source of Infection   Chronic Atrial Fibrillation -Continue rate control with Carvedilol 6.25 mg twice daily,  -Hold Rivaroxaban due to drop in hemoglobin. -Follow up with PCP or Cardiology in 1 week to re-initiate Anticoagulation that was held because of Acute Drop in Hb and FOBT+  Hypertension -Continue Carvedilol -Currently Holding on hydralazine, furosemide and spironolactone to avoid further hypotension but can be resumed at D/C -Follow up with Cardiology  LE Wounds/Venous Stasis Ulcers -Follow up with Wound Clinic  Depression -Continue Sertraline.  Hypothyroidism -Continue Levothyroxine.  Chronic Diastolic CHF -Not currently exacerbated -Resume Home Meds including Furosemide, Carvedilol, Spironolactone  Discharge Instructions Discharge Instructions    Call MD for:  difficulty breathing, headache or visual disturbances   Complete by:  As directed    Call MD for:  extreme fatigue   Complete by:  As directed    Call MD for:  hives   Complete by:  As directed    Call MD for:  persistant dizziness or light-headedness   Complete by:  As directed    Call MD for:  persistant nausea and vomiting   Complete by:  As directed    Call MD for:  redness, tenderness, or signs of infection (pain, swelling, redness, odor or green/yellow discharge around incision site)   Complete by:  As directed    Call MD for:  severe uncontrolled pain   Complete by:  As  directed    Call MD for:  temperature >100.4   Complete by:  As directed    Diet - low sodium heart healthy   Complete by:  As directed    Discharge instructions   Complete by:  As directed    You were cared for by a hospitalist during your hospital stay. If you have any questions about your discharge medications or the care you received while you were in the hospital after you are discharged, you can call the unit and ask to speak with the hospitalist on call if the hospitalist that took care of you is not available. Once you are discharged, your primary care physician will handle any further medical issues. Please note that NO REFILLS for any discharge medications will be authorized once you are discharged, as it is imperative that you return to your primary care physician (or establish a relationship with a primary care physician if you do not have one) for your aftercare needs so that they can reassess your need for medications and monitor your lab values.  Follow up with PCP and Cardiology. Take all medications as prescribed and Hold Xarelto for a week and have PCP or Cardiology decide whether to resume. Follow up with GI if needed. If symptoms change or worsen please return to the ED for evaluation   Increase activity slowly   Complete by:  As directed      Allergies as of 11/21/2018      Reactions   Sulfa Antibiotics Rash   Rash w/ sulfonomides furosemide, meloxicam  Codeine    unknown   Penicillins    unknown   Sulfonylureas    unknown   Clindamycin/lincomycin Itching, Rash      Medication List    STOP taking these medications   predniSONE 10 MG (21) Tbpk tablet Commonly known as:  STERAPRED UNI-PAK 21 TAB     TAKE these medications   acetaminophen 325 MG tablet Commonly known as:  TYLENOL Take 650 mg by mouth 3 (three) times daily.   albuterol (2.5 MG/3ML) 0.083% nebulizer solution Commonly known as:  PROVENTIL Take 3 mLs (2.5 mg total) by nebulization every 6 (six)  hours as needed for wheezing or shortness of breath.   carvedilol 6.25 MG tablet Commonly known as:  COREG Take 6.25 mg by mouth daily.   DESITIN 13 % Crea Generic drug:  Zinc Oxide Apply 1 application topically 2 (two) times daily.   diclofenac sodium 1 % Gel Commonly known as:  VOLTAREN Apply 2 g topically 3 (three) times daily. Both lower extremities   furosemide 40 MG tablet Commonly known as:  LASIX Take 1 tablet (40 mg total) by mouth daily.   hydrALAZINE 25 MG tablet Commonly known as:  APRESOLINE Take 1 tablet (25 mg total) by mouth every 8 (eight) hours. What changed:  when to take this   hydrOXYzine 25 MG tablet Commonly known as:  ATARAX/VISTARIL Take 1 tablet (25 mg total) by mouth every 8 (eight) hours as needed.   JUICE PLUS FIBRE PO Take 1 capsule by mouth 3 (three) times daily. Take one capsule of each three times a day [employer]   levothyroxine 100 MCG tablet Commonly known as:  SYNTHROID, LEVOTHROID Take 100 mcg by mouth at bedtime.   lip balm ointment Apply topically as needed for lip care.   mineral oil external liquid Place 2 drops into both ears once a week.   multivitamin Liqd Take 15 mLs by mouth daily.   pantoprazole 40 MG tablet Commonly known as:  PROTONIX Take 1 tablet (40 mg total) by mouth daily.   potassium chloride SA 20 MEQ tablet Commonly known as:  K-DUR,KLOR-CON Take 20 mEq by mouth daily.   rivaroxaban 20 MG Tabs tablet Commonly known as:  XARELTO Take 1 tablet (20 mg total) by mouth daily. Defer to PCP or Cardiology to resume in 1 week Start taking on:  11/28/2018 What changed:    additional instructions  These instructions start on 11/28/2018. If you are unsure what to do until then, ask your doctor or other care provider.   sertraline 50 MG tablet Commonly known as:  ZOLOFT Take 75 mg by mouth daily.   spironolactone 25 MG tablet Commonly known as:  ALDACTONE Take 1 tablet (25  mg total) by mouth daily.   thiamine 100 MG tablet Take 1 tablet (100 mg total) by mouth daily.   traMADol 50 MG tablet Commonly known as:  ULTRAM Take 1 tablet (50 mg total) by mouth as directed. Take 1 tablet BID & Take 1 tablet daily PRN for Pain.   vitamin B-12 1000 MCG tablet Commonly known as:  CYANOCOBALAMIN Take 1,000 mcg by mouth daily.   VITAMIN D (ERGOCALCIFEROL) PO Take 1,000 Units by mouth daily.       Allergies  Allergen Reactions  . Sulfa Antibiotics Rash    Rash w/ sulfonomides furosemide, meloxicam  . Codeine     unknown  . Penicillins     unknown  . Sulfonylureas  unknown  . Clindamycin/Lincomycin Itching and Rash   Consultations:  Gastroenterology Dr. Randa Evens  Procedures/Studies: Dg Chest 2 View  Result Date: 11/19/2018 CLINICAL DATA:  Fever EXAM: CHEST - 2 VIEW COMPARISON:  11/03/2018 FINDINGS: Cardiac enlargement with mild vascular congestion. Negative for pulmonary edema. Small bilateral pleural effusions similar to the prior study. Mild right lower lobe atelectasis. Negative for pneumonia. IMPRESSION: Small bilateral pleural effusions unchanged. Mild vascular congestion without edema Mild right lower lobe atelectasis. Electronically Signed   By: Marlan Palau M.D.   On: 11/19/2018 07:50   Dg Tibia/fibula Left  Result Date: 10/29/2018 CLINICAL DATA:  Assess cellulitis.  Sepsis. EXAM: LEFT TIBIA AND FIBULA - 2 VIEW COMPARISON:  None. FINDINGS: No osseous erosions are seen to suggest osteomyelitis. The tibia and fibula appear grossly intact. Minimal degenerative change is noted at the patellofemoral compartment. The visualized portions of the left knee are grossly unremarkable. The ankle mortise is not well characterized. Mild degenerative change is suggested at the tibiotalar articulation. Scattered vascular calcifications are seen. IMPRESSION: 1. No osseous erosions seen to suggest osteomyelitis. Tibia and fibula appear grossly intact. 2. Scattered  vascular calcifications seen. Electronically Signed   By: Roanna Raider M.D.   On: 10/29/2018 22:39   Dg Tibia/fibula Right  Result Date: 10/29/2018 CLINICAL DATA:  Assess cellulitis.  Sepsis. EXAM: RIGHT TIBIA AND FIBULA - 2 VIEW COMPARISON:  None. FINDINGS: No osseous erosions are seen to suggest osteomyelitis. Degenerative change is noted at the patellofemoral compartment, with joint space narrowing. An enthesophyte is noted at the upper pole of the patella. The tibia and fibula appear grossly intact. The ankle mortise is incompletely assessed, but appears grossly unremarkable. Soft tissue swelling is noted about the medial aspect of the knee and proximal lower leg. Scattered vascular calcifications are seen. IMPRESSION: 1. No osseous erosions seen to suggest osteomyelitis. 2. Scattered vascular calcifications seen. 3. Degenerative change at the patellofemoral compartment. Electronically Signed   By: Roanna Raider M.D.   On: 10/29/2018 22:41   Dg Ankle 2 Views Left  Result Date: 10/29/2018 CLINICAL DATA:  Assess cellulitis.  Sepsis. EXAM: LEFT ANKLE - 2 VIEW COMPARISON:  None. FINDINGS: There is no evidence of fracture or dislocation. No osseous erosions are definitely characterized to suggest osteomyelitis. However, there is diffuse osteopenia of visualized osseous structures, limiting evaluation. Soft tissue swelling is noted about the lower leg and ankle, with a dressing overlying the medial aspect of the ankle. Diffuse vascular calcifications are seen. Mild degenerative change is noted at the talar dome. A plantar calcaneal spur is seen. An os peroneum is noted. IMPRESSION: 1. No evidence of fracture or dislocation. No definite evidence for osteomyelitis. However, diffuse osteopenia of visualized osseous structures limits evaluation for erosions. 2. Diffuse vascular calcifications seen. 3. Soft tissue swelling about the lower leg and ankle. Electronically Signed   By: Roanna Raider M.D.   On:  10/29/2018 22:43   Dg Ankle 2 Views Right  Result Date: 10/29/2018 CLINICAL DATA:  Assess cellulitis.  Sepsis. EXAM: RIGHT ANKLE - 2 VIEW COMPARISON:  None. FINDINGS: There is no evidence of fracture or dislocation. No osseous erosions are seen to suggest osteomyelitis. The ankle mortise is grossly unremarkable in appearance. Mild degenerative change is noted about the midfoot. A small plantar calcaneal spur is seen. An os peroneum is noted. Scattered vascular calcifications are noted. No significant soft tissue swelling is characterized on radiograph. IMPRESSION: 1. No osseous erosions seen to suggest osteomyelitis. No evidence of fracture or  dislocation. 2. Scattered vascular calcifications seen. Electronically Signed   By: Roanna Raider M.D.   On: 10/29/2018 22:42   Dg Chest Port 1 View  Result Date: 11/03/2018 CLINICAL DATA:  dyspnea EXAM: PORTABLE CHEST 1 VIEW COMPARISON:  Chest radiograph 10/29/2018 FINDINGS: Stable enlarged cardiac silhouette. Small RIGHT effusion similar prior. Lungs are hyperinflated. No infiltrate or pneumothorax. Apical calcific scarring. IMPRESSION: 1. Hyperinflated lungs. 2. Persistent RIGHT pleural effusion. Electronically Signed   By: Genevive Bi M.D.   On: 11/03/2018 10:38   Dg Chest Port 1 View  Result Date: 10/29/2018 CLINICAL DATA:  Dyspnea EXAM: PORTABLE CHEST 1 VIEW COMPARISON:  07/01/2016 CXR FINDINGS: Emphysematous hyperinflation of the lungs with stable cardiomegaly and aortic atherosclerosis. Scarring and atelectasis at the right lung base. Blunting of the right costophrenic angle may reflect a small effusion or pleural thickening. Osteoarthritis of the glenohumeral joint on the right with inferomedial spurring off the humeral head. Mild AC joint osteoarthritis is also noted on the right. IMPRESSION: COPD with aortic atherosclerosis. Electronically Signed   By: Tollie Eth M.D.   On: 10/29/2018 18:47    Subjective: Seen and examined and was somnolent  and drowsy from sleeping.  She was awoken was somewhat agitated.  No chest pain, lightheadedness or dizziness.  No nausea or vomiting.  No other concerns or complaints at this time.  I discussed with the healthcare power of attorney and decision was made to send her back to the facility and we will hold on anticoagulation early so we can defer to PCP or Cardiology to reinitiate.  Discharge Exam: Vitals:   11/20/18 2118 11/21/18 0652  BP: (!) 146/76 111/85  Pulse: 90 94  Resp: 16 19  Temp: 97.8 F (36.6 C) 97.9 F (36.6 C)  SpO2: 98% 98%   Vitals:   11/20/18 0549 11/20/18 1352 11/20/18 2118 11/21/18 0652  BP: 126/66 127/76 (!) 146/76 111/85  Pulse: 74 99 90 94  Resp: 20 16 16 19   Temp: 97.9 F (36.6 C) 97.7 F (36.5 C) 97.8 F (36.6 C) 97.9 F (36.6 C)  TempSrc: Oral Oral Oral Oral  SpO2: 93% 98% 98% 98%  Weight:      Height:       General: Pt is sleeping, not in acute distress; Drowsy when aroused Cardiovascular: Irregularly Irregular. S1/S2 +, no rubs, no gallops Respiratory: Diminished bilaterally, no wheezing, no rhonchi Abdominal: Soft, NT, ND, bowel sounds + Extremities: Trace edema, no cyanosis  The results of significant diagnostics from this hospitalization (including imaging, microbiology, ancillary and laboratory) are listed below for reference.    Microbiology: No results found for this or any previous visit (from the past 240 hour(s)).   Labs: BNP (last 3 results) No results for input(s): BNP in the last 8760 hours. Basic Metabolic Panel: Recent Labs  Lab 11/19/18 0644 11/20/18 0519 11/21/18 0530  NA 139 139 139  K 4.1 3.7 3.8  CL 99 103 103  CO2 30 25 25   GLUCOSE 91 84 83  BUN 19 16 16   CREATININE 1.08* 0.80 0.73  CALCIUM 8.6* 8.0* 8.3*  MG  --   --  2.2  PHOS  --   --  2.9   Liver Function Tests: Recent Labs  Lab 11/21/18 0530  AST 17  ALT 12  ALKPHOS 65  BILITOT 1.2  PROT 6.6  ALBUMIN 3.1*   No results for input(s): LIPASE, AMYLASE  in the last 168 hours. No results for input(s): AMMONIA in the last 168  hours. CBC: Recent Labs  Lab 11/19/18 0644 11/20/18 0519 11/21/18 0530  WBC 7.8 6.7 7.3  NEUTROABS 5.3  --  5.4  HGB 10.3* 9.1* 10.1*  HCT 32.4* 28.8* 32.3*  MCV 99.1 100.0 100.6*  PLT 286 265 267   Cardiac Enzymes: No results for input(s): CKTOTAL, CKMB, CKMBINDEX, TROPONINI in the last 168 hours. BNP: Invalid input(s): POCBNP CBG: No results for input(s): GLUCAP in the last 168 hours. D-Dimer No results for input(s): DDIMER in the last 72 hours. Hgb A1c No results for input(s): HGBA1C in the last 72 hours. Lipid Profile No results for input(s): CHOL, HDL, LDLCALC, TRIG, CHOLHDL, LDLDIRECT in the last 72 hours. Thyroid function studies No results for input(s): TSH, T4TOTAL, T3FREE, THYROIDAB in the last 72 hours.  Invalid input(s): FREET3 Anemia work up Recent Labs    11/20/18 0519  FERRITIN 152  TIBC 248*  IRON 42   Urinalysis    Component Value Date/Time   COLORURINE YELLOW 11/19/2018 0836   APPEARANCEUR CLEAR 11/19/2018 0836   LABSPEC 1.017 11/19/2018 0836   PHURINE 7.0 11/19/2018 0836   GLUCOSEU NEGATIVE 11/19/2018 0836   HGBUR NEGATIVE 11/19/2018 0836   BILIRUBINUR NEGATIVE 11/19/2018 0836   KETONESUR NEGATIVE 11/19/2018 0836   PROTEINUR NEGATIVE 11/19/2018 0836   UROBILINOGEN 0.2 10/30/2014 2242   NITRITE NEGATIVE 11/19/2018 0836   LEUKOCYTESUR NEGATIVE 11/19/2018 0836   Sepsis Labs Invalid input(s): PROCALCITONIN,  WBC,  LACTICIDVEN Microbiology No results found for this or any previous visit (from the past 240 hour(s)).  Time coordinating discharge: 35 minutes  SIGNED:  Merlene Laughter, DO [facility] 11/21/2018, 1:27 PM Pager is on AMION  If 7PM-7AM, please contact night-coverage [email or web address] Password [email or web address]

## 2018-11-21 NOTE — Progress Notes (Signed)
Patient discharging back to [facility]. Confirmed ability to return w/ staff member, Burna Mortimer, & faxed needed documents. Patient will transport via [facility].  RN call report: [phone].  Enid Cutter, MSW, LCSWA Clinical Social Work [phone]

## 2018-11-21 NOTE — [place] FL2 (Signed)
[place] MEDICAID FL2 LEVEL OF CARE SCREENING TOOL     IDENTIFICATION  Patient Name: Brittany Schultz Birthdate: 1922-05-17 Sex: female Admission Date (Current Location): 11/19/2018  [facility] and [place] Number:  Producer, television/film/video and Address:  [facility],  [address] [place], [place] [address]      Provider Number: [id number]  Attending Physician Name and Address:  Merlene Laughter, DO  Relative Name and Phone Number:  Berlinda Last: [phone]    Current Level of Care: Hospital Recommended Level of Care: Assisted Living Facility Prior Approval Number:    Date Approved/Denied:   PASRR Number:    Discharge Plan: Other (Comment)(ALF)    Current Diagnoses: Patient Active Problem List   Diagnosis Date Noted  . Renal failure 11/19/2018  . Encephalopathy in sepsis 10/31/2018  . Sepsis (HCC) 10/29/2018  . Sepsis due to cellulitis (HCC) 10/29/2018  . Upper airway cough syndrome 07/01/2016  . Pleural effusion on right 07/01/2016  . Acute CHF (HCC) 10/31/2014  . Acute encephalopathy 10/31/2014  . Atrial fibrillation (HCC) 10/31/2014  . Subacute confusional state 10/30/2014  . Cellulitis 10/30/2014  . 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 Lymphedema 04/20/2014  . Debility 03/30/2014  . Shortness of breath 03/23/2014  . Deaf   . Varicose veins of lower extremities with ulcer (HCC)   . Peripheral vascular disease (HCC)   . Muscle weakness (generalized)   . Other malaise and fatigue   . Lack of coordination   . Unstable gait   . Hyperlipidemia   . Venous insufficiency of both lower extremities   . Open wound of left lower leg   . Hypertension   . Osteoarthritis of left hip   . Long term current use of anticoagulant therapy   . Thrombocytopenia (HCC) 03/15/2014  . Cellulitis and abscess of leg, except foot 02/25/2014  . Generalized weakness 02/21/2014  . Unspecified constipation 02/21/2014  . Dermatitis  02/21/2014  . Unspecified hypothyroidism 02/21/2014  . Essential hypertension, benign 02/19/2014  . Congestive heart failure (HCC) 02/19/2014  . A-fib (HCC) 02/19/2014  . Macular rash 02/19/2014  . Osteoarthritis 02/19/2014  . Hypothyroidism 02/19/2014  . Venous stasis ulcer of left lower extremity (HCC) 02/19/2014    Orientation RESPIRATION BLADDER Height & Weight     Self  Normal Incontinent, External catheter Weight: 156 lb 14.4 oz (71.2 kg) Height:  5\' 4"  (162.6 cm)  BEHAVIORAL SYMPTOMS/MOOD NEUROLOGICAL BOWEL NUTRITION STATUS      Incontinent Diet(Regular)  AMBULATORY STATUS COMMUNICATION OF NEEDS Skin   Extensive Assist Verbally PU Stage and Appropriate Care   ([place])   R, L leg venous stasis ulcer L perinium wound                    Personal Care Assistance Level of Assistance  Bathing, Feeding, Dressing Bathing Assistance: Maximum assistance Feeding assistance: Limited assistance Dressing Assistance: Maximum assistance     Functional Limitations Info  Sight, Speech, Hearing Sight Info: Impaired Hearing Info: Impaired Speech Info: Adequate    SPECIAL CARE FACTORS FREQUENCY  PT (By licensed PT), OT (By licensed OT)     PT Frequency: 2x/week OT Frequency: 2x/week      Home Health PT, OT      Contractures Contractures Info: Not present    Additional Factors Info  Code Status, Allergies, Psychotropic Code Status Info: DNR Allergies Info: SULFA ANTIBIOTICS, CODEINE, PENICILLINS, SULFONYLUREAS, CLINDAMYCIN/LINCOMYCIN  Psychotropic Info: Zoloft  Current Medications (11/21/2018):  This is the current hospital active medication list Current Facility-Administered Medications  Medication Dose Route Frequency Provider Last Rate Last Dose  . acetaminophen (TYLENOL) tablet 650 mg  650 mg Oral Q6H PRN Arrien, York Ram, MD   650 mg at 11/19/18 2233   Or  . acetaminophen (TYLENOL) suppository 650 mg  650 mg Rectal Q6H PRN Arrien, York Ram, MD      . albuterol (PROVENTIL) (2.5 MG/3ML) 0.083% nebulizer solution 2.5 mg  2.5 mg Nebulization Q6H PRN Arrien, York Ram, MD      . carvedilol (COREG) tablet 6.25 mg  6.25 mg Oral Q breakfast Arrien, York Ram, MD   6.25 mg at 11/20/18 0752  . feeding supplement (ENSURE ENLIVE) (ENSURE ENLIVE) liquid 237 mL  237 mL Oral Q24H Coralie Keens, MD   237 mL at 11/19/18 1431  . levothyroxine (SYNTHROID, LEVOTHROID) tablet 100 mcg  100 mcg Oral QHS Arrien, York Ram, MD   100 mcg at 11/20/18 2312  . [START ON 11/22/2018] Mineral Oil OIL 2 drop  2 drop Both EARS Weekly Otho Bellows, RPH      . multivitamin liquid 15 mL  15 mL Oral Daily Arrien, York Ram, MD   15 mL at 11/20/18 1117  . ondansetron (ZOFRAN) tablet 4 mg  4 mg Oral Q6H PRN Arrien, York Ram, MD       Or  . ondansetron [facility]) injection 4 mg  4 mg Intravenous Q6H PRN Arrien, York Ram, MD      . pantoprazole (PROTONIX) EC tablet 40 mg  40 mg Oral Daily Arrien, York Ram, MD   40 mg at 11/20/18 1115  . sertraline (ZOLOFT) tablet 75 mg  75 mg Oral Daily Arrien, York Ram, MD   75 mg at 11/20/18 1115  . thiamine (VITAMIN B-1) tablet 100 mg  100 mg Oral Daily Arrien, York Ram, MD   100 mg at 11/20/18 1115  . traMADol (ULTRAM) tablet 50 mg  50 mg Oral BID Coralie Keens, MD   50 mg at 11/19/18 1549  . traMADol (ULTRAM) tablet 50 mg  50 mg Oral Daily PRN Otho Bellows, RPH      . vitamin B-12 (CYANOCOBALAMIN) tablet 1,000 mcg  1,000 mcg Oral Daily Arrien, York Ram, MD   1,000 mcg at 11/20/18 1115     Discharge Medications: acetaminophen 325 MG tablet Commonly known as:  TYLENOL Take 650 mg by mouth 3 (three) times daily.   albuterol (2.5 MG/3ML) 0.083% nebulizer solution Commonly known as:  PROVENTIL Take 3 mLs (2.5 mg total) by nebulization every 6 (six) hours as needed for wheezing or shortness of breath.   carvedilol 6.25 MG tablet Commonly  known as:  COREG Take 6.25 mg by mouth daily.   DESITIN 13 % Crea Generic drug:  Zinc Oxide Apply 1 application topically 2 (two) times daily.   diclofenac sodium 1 % Gel Commonly known as:  VOLTAREN Apply 2 g topically 3 (three) times daily. Both lower extremities   furosemide 40 MG tablet Commonly known as:  LASIX Take 1 tablet (40 mg total) by mouth daily.   hydrALAZINE 25 MG tablet Commonly known as:  APRESOLINE Take 1 tablet (25 mg total) by mouth every 8 (eight) hours. What changed:  when to take this   hydrOXYzine 25 MG tablet Commonly known as:  ATARAX/VISTARIL Take 1 tablet (25 mg total) by mouth every 8 (eight) hours as needed.   JUICE PLUS  FIBRE PO Take 1 capsule by mouth 3 (three) times daily. Take one capsule of each three times a day [employer]   levothyroxine 100 MCG tablet Commonly known as:  SYNTHROID, LEVOTHROID Take 100 mcg by mouth at bedtime.   lip balm ointment Apply topically as needed for lip care.   mineral oil external liquid Place 2 drops into both ears once a week.   multivitamin Liqd Take 15 mLs by mouth daily.   pantoprazole 40 MG tablet Commonly known as:  PROTONIX Take 1 tablet (40 mg total) by mouth daily.   potassium chloride SA 20 MEQ tablet Commonly known as:  K-DUR,KLOR-CON Take 20 mEq by mouth daily.   rivaroxaban 20 MG Tabs tablet Commonly known as:  XARELTO Take 1 tablet (20 mg total) by mouth daily. Defer to PCP or Cardiology to resume in 1 week Start taking on:  11/28/2018 What changed:    additional instructions  These instructions start on 11/28/2018. If you are unsure what to do until then, ask your doctor or other care provider.   sertraline 50 MG tablet Commonly known as:  ZOLOFT Take 75 mg by mouth daily.   spironolactone 25 MG tablet Commonly known as:  ALDACTONE Take 1 tablet (25 mg total) by mouth daily.   thiamine 100 MG tablet Take 1 tablet (100 mg  total) by mouth daily.   traMADol 50 MG tablet Commonly known as:  ULTRAM Take 1 tablet (50 mg total) by mouth as directed. Take 1 tablet BID & Take 1 tablet daily PRN for Pain.   vitamin B-12 1000 MCG tablet Commonly known as:  CYANOCOBALAMIN Take 1,000 mcg by mouth daily.   VITAMIN D (ERGOCALCIFEROL) PO Take 1,000 Units by mouth daily.     Relevant Imaging Results:  Relevant Lab Results:   Additional Information SSN: [id number]  [place] Cutter, [place]

## 2018-11-21 NOTE — Care Management Note (Signed)
Case Management Note  Patient Details  Name: Brittany Schultz MRN: [id number] Date of Birth: 06/10/1922  Subjective/Objective:   AKI, GI bleed,                  Action/Plan: Contacted [facility] to make aware of dc home today with resumption of HH. Pt is from [facility]F-[facility].   Expected Discharge Date:  11/21/18               Expected Discharge Plan:  Assisted Living / Rest Home([facility])  In-House Referral:  Clinical Social Work  Discharge planning Services  CM Consult  Post Acute Care Choice:  Home Health Choice offered to:  Patient  DME Arranged:  N/A DME Agency:  NA  HH Arranged:  RN, PT HH Agency:  [facility]  Status of Service:  Completed, signed off  If discussed at [employer], dates discussed:    Additional Comments:  Elliot Cousin, RN 11/21/2018, 3:25 PM

## 2018-11-22 DIAGNOSIS — I87313 Chronic venous hypertension (idiopathic) with ulcer of bilateral lower extremity: Secondary | ICD-10-CM | POA: Diagnosis not present

## 2018-11-24 NOTE — Progress Notes (Signed)
[facility] Telephone: [phone] Fax: [phone]  PATIENT NAME: Brittany Schultz DOB: November 19, 1922 MRN: [id number] [facility] RM 136.Move in date 04/28/2017  PRIMARY CARE PROVIDER:   Merlene Laughter, MD  REFERRING PROVIDER: Delle Reining, NP [employer]  RESPONSIBLE PARTY:   Patrecia Pace [phone], Haynes Hoehn [phone] great niece very involved. Mark in a PT by [employer]; not currentlyworking in this field.   HISTORY OF PRESENT ILLNESS:  Brittany Schultz is a 84 y.o. female with h/o HTN, dyslipidemia, acute renal failure, GI bleed, A-fib, CHF, venous insufficiency with associated LE wounds, and peripheral vascular disease. She is s/p hospital admission 12/06-12/08/19 and was treated for acute kidney injury due to dehydration, anemia thought r/t subacute GIB (OP GI consult; hold rivaroxaban, started PPI, hgb 10.1 at time of discharge), metabolic encephalopathy/hospital delirium. Palliative care consult to assist with goals of care.  ASSESSMENT:     1. Cognitive changes of acute illness/hospital delirium; currently at baseline. A & O x 3. Bright and alert affect.  2. Weakness r/t deconditioning/age/co-morbid illness: Dependent for hygiene, dressing, and transfers. PT following. She is often incontinent of bowel and bladder because of delay in making it to the bathroom in time (needs to wait for assist). No signs of weight loss.  Weight 158lbs. At height of 5'4" her BMI is 27.1kg/m2.  3. LE wounds r/t PVD: Wound clinic visits q wk; report some improvement. [facility] following within facility.   4. Right foot/heel/ankle pain: Tramadol recently added to regimen.  5. Goals of Care: Met with patient's great niece and HCPOA Haynes Hoehn. We discussed benefits vs burdens for w/u of patient's complex medical conditions. Rena expressed desire to minimize outside physician office visits for patient, due to cold weather/risk of patient exposure to community acquired  illnesses such as flu, and wish to minimize the family burden of these visits as well. 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  -Mearl Latin wishes to cancel outpatient GI testing/evaluation, understanding that there was not a definitive source of anemia; though positive FOBT patient's Hgb was stable at 10 at time of discharge (was not transfused).  -Rena wishes to continue weekly wound care clinic appointments for LE wounds  -We discussed risk of embolic stroke (setting of afib) off of Xarelto vs risk of GIB on this agent. Mearl Latin would like to restart the Xarelto as patient doesn't have a history of falls, is fairly cognitively intact, and has a stable Hgb. Mearl Latin recognizes risk of hemorraghic stroke should patient fall and strike her head in the setting of anticoagulation. She asks that NP Thu follow patient's Hbg level as she feels necessary, and revisit continuing vs discontinuing Xarelto as indicated. I will f/u with NP Thu for her disposition.  6. Advanced Care Directives: DNR status confirmed. We discussed details of the MOST form. Written educational material provided. Mearl Latin will review this with her husband, will complete the form, and drop it off to the facility to be placed onto patient's chart.   7. F/U: NP visit in 2-4 weeks. Handicap placard application completed and given to [facility].  I spent 90 minutes providing this consultation,  from noon to 1:30pm. More than 50% of this time was spent coordinating communication.   CODE STATUS: DNR  PPS: 30% HOSPICE ELIGIBILITY/DIAGNOSIS: TBD  PAST MEDICAL HISTORY:  Past Medical History:  Diagnosis Date  . A-fib [facility])    hospitalized 04/2009  .  CHF (congestive heart failure) ([place])   . Deaf   . Dermatitis 01/2014  . History of shingles   . Hx of colonic polyps    Adenomatous polyps  . Hyperlipidemia   . Hypertension   . Lack of coordination   . Long term (current) use of anticoagulants     warfarin for stroke risk reduction re: AF  . Muscle weakness (generalized)   . Open wound of left lower leg 02/2104  . Osteoarthritis of left hip   . Other malaise and fatigue   . Peripheral vascular disease, unspecified ([place])   . PVD (peripheral vascular disease) ([place])   . Shortness of breath 03/23/14  . Thrombocytopenia ([place]) April 2015   144,000  . Unspecified hypothyroidism   . Unstable gait   . Varicose veins of lower extremities with ulcer ([place])   . Venous insufficiency of both lower extremities    vascular evaluation 10/2013, 01/2014 Dr.Powell  . Venous stasis ulcer of left lower extremity ([place])   . Vitamin D deficiency     SOCIAL HX:  Social History   Tobacco Use  . Smoking status: Never Smoker  . Smokeless tobacco: Never Used  Substance Use Topics  . Alcohol use: No    ALLERGIES:  Allergies  Allergen Reactions  . Sulfa Antibiotics Rash    Rash w/ sulfonomides furosemide, meloxicam  . Codeine     unknown  . Penicillins     unknown  . Sulfonylureas     unknown  . Clindamycin/Lincomycin Itching and Rash     PERTINENT MEDICATIONS:  Outpatient Encounter Medications as of 11/25/2018  Medication Sig  . acetaminophen (TYLENOL) 325 MG tablet Take 650 mg by mouth 3 (three) times daily.   Marland Kitchen albuterol (PROVENTIL) (2.5 MG/3ML) 0.083% nebulizer solution Take 3 mLs (2.5 mg total) by nebulization every 6 (six) hours as needed for wheezing or shortness of breath.  . carvedilol (COREG) 6.25 MG tablet Take 6.25 mg by mouth daily.   . furosemide (LASIX) 40 MG tablet Take 1 tablet (40 mg total) by mouth daily.  . hydrALAZINE (APRESOLINE) 25 MG tablet Take 1 tablet (25 mg total) by mouth every 8 (eight) hours. (Patient taking differently: Take 25 mg by mouth every 8 (eight) hours. )  . hydrOXYzine (ATARAX/VISTARIL) 25 MG tablet Take 1 tablet (25 mg total) by mouth every 8 (eight) hours as needed.  Marland Kitchen levothyroxine (SYNTHROID, LEVOTHROID) 100 MCG tablet Take 100 mcg by mouth at  bedtime.   . lip balm (CARMEX) ointment Apply topically as needed for lip care.  . mineral oil external liquid Place 2 drops into both ears once a week.  . Multiple Vitamin (MULTIVITAMIN) LIQD Take 15 mLs by mouth daily.  . Nutritional Supplements (JUICE PLUS FIBRE PO) Take 1 capsule by mouth 3 (three) times daily. Take one capsule of each three times a day [place]  . pantoprazole (PROTONIX) 40 MG tablet Take 1 tablet (40 mg total) by mouth daily.  . potassium chloride SA (K-DUR,KLOR-CON) 20 MEQ tablet Take 20 mEq by mouth daily.  . sertraline (ZOLOFT) 50 MG tablet Take 75 mg by mouth daily.   Marland Kitchen spironolactone (ALDACTONE) 25 MG tablet Take 1 tablet (25 mg total) by mouth daily.  Marland Kitchen thiamine 100 MG tablet Take 1 tablet (100 mg total) by mouth daily.  . traMADol (ULTRAM) 50 MG tablet Take 1 tablet (50 mg total) by mouth as directed. Take 1 tablet BID & Take 1 tablet daily PRN  for Pain.  . vitamin B-12 (CYANOCOBALAMIN) 1000 MCG tablet Take 1,000 mcg by mouth daily.  Marland Kitchen VITAMIN D, ERGOCALCIFEROL, PO Take 1,000 Units by mouth daily.   . Zinc Oxide (DESITIN) 13 % CREA Apply 1 application topically 2 (two) times daily.  . [DISCONTINUED] diclofenac sodium (VOLTAREN) 1 % GEL Apply 2 g topically 3 (three) times daily. Both lower extremities  . [DISCONTINUED] rivaroxaban (XARELTO) 20 MG TABS tablet Take 1 tablet (20 mg total) by mouth daily. Defer to PCP or Cardiology to resume in 1 week   No facility-administered encounter medications on file as of 11/25/2018.     PHYSICAL EXAM:  VS: BP 106/68, HR 84, RR 12 General: NAD, pleasantly conversant, on topic, A & O X 3 Cardiovascular: regular rate and rhythm Pulmonary: clear ant fields Abdomen: soft, nontender, + bowel sounds Extremities: LE leg wraps in place, no joint deformities Neurological: Weakness but otherwise nonfocal  Julianne Handler, NP

## 2018-11-25 ENCOUNTER — Non-Acute Institutional Stay: Payer: Medicare Other | Admitting: Internal Medicine

## 2018-11-25 VITALS — BP 106/68 | HR 84 | Resp 12

## 2018-11-25 DIAGNOSIS — Z515 Encounter for palliative care: Secondary | ICD-10-CM

## 2018-11-25 DIAGNOSIS — R531 Weakness: Principal | ICD-10-CM

## 2018-11-26 ENCOUNTER — Encounter: Payer: Self-pay | Admitting: Internal Medicine

## 2018-11-29 DIAGNOSIS — I87313 Chronic venous hypertension (idiopathic) with ulcer of bilateral lower extremity: Secondary | ICD-10-CM | POA: Diagnosis not present

## 2018-12-13 DIAGNOSIS — I87313 Chronic venous hypertension (idiopathic) with ulcer of bilateral lower extremity: Secondary | ICD-10-CM | POA: Diagnosis not present

## 2018-12-27 ENCOUNTER — Encounter (HOSPITAL_BASED_OUTPATIENT_CLINIC_OR_DEPARTMENT_OTHER): Payer: Medicare Other | Attending: Internal Medicine

## 2018-12-27 DIAGNOSIS — L97822 Non-pressure chronic ulcer of other part of left lower leg with fat layer exposed: Secondary | ICD-10-CM | POA: Insufficient documentation

## 2018-12-27 DIAGNOSIS — L97522 Non-pressure chronic ulcer of other part of left foot with fat layer exposed: Secondary | ICD-10-CM | POA: Insufficient documentation

## 2018-12-27 DIAGNOSIS — I87313 Chronic venous hypertension (idiopathic) with ulcer of bilateral lower extremity: Secondary | ICD-10-CM | POA: Diagnosis not present

## 2018-12-27 DIAGNOSIS — I10 Essential (primary) hypertension: Secondary | ICD-10-CM | POA: Insufficient documentation

## 2018-12-27 DIAGNOSIS — L97812 Non-pressure chronic ulcer of other part of right lower leg with fat layer exposed: Secondary | ICD-10-CM | POA: Diagnosis not present

## 2018-12-27 DIAGNOSIS — M069 Rheumatoid arthritis, unspecified: Secondary | ICD-10-CM | POA: Insufficient documentation

## 2019-01-11 DIAGNOSIS — I87313 Chronic venous hypertension (idiopathic) with ulcer of bilateral lower extremity: Secondary | ICD-10-CM | POA: Diagnosis not present

## 2019-01-26 ENCOUNTER — Encounter (HOSPITAL_BASED_OUTPATIENT_CLINIC_OR_DEPARTMENT_OTHER): Payer: Medicare Other | Attending: Internal Medicine

## 2019-01-26 DIAGNOSIS — L97522 Non-pressure chronic ulcer of other part of left foot with fat layer exposed: Secondary | ICD-10-CM | POA: Insufficient documentation

## 2019-01-26 DIAGNOSIS — L97222 Non-pressure chronic ulcer of left calf with fat layer exposed: Secondary | ICD-10-CM | POA: Insufficient documentation

## 2019-01-26 DIAGNOSIS — I87313 Chronic venous hypertension (idiopathic) with ulcer of bilateral lower extremity: Secondary | ICD-10-CM | POA: Insufficient documentation

## 2019-01-26 DIAGNOSIS — L97212 Non-pressure chronic ulcer of right calf with fat layer exposed: Secondary | ICD-10-CM | POA: Insufficient documentation

## 2019-01-26 DIAGNOSIS — I10 Essential (primary) hypertension: Secondary | ICD-10-CM | POA: Insufficient documentation

## 2019-01-26 DIAGNOSIS — L97822 Non-pressure chronic ulcer of other part of left lower leg with fat layer exposed: Secondary | ICD-10-CM | POA: Insufficient documentation

## 2019-01-26 DIAGNOSIS — L97812 Non-pressure chronic ulcer of other part of right lower leg with fat layer exposed: Secondary | ICD-10-CM | POA: Insufficient documentation

## 2019-01-31 DIAGNOSIS — I10 Essential (primary) hypertension: Secondary | ICD-10-CM | POA: Diagnosis not present

## 2019-01-31 DIAGNOSIS — L97222 Non-pressure chronic ulcer of left calf with fat layer exposed: Secondary | ICD-10-CM | POA: Diagnosis not present

## 2019-01-31 DIAGNOSIS — I87313 Chronic venous hypertension (idiopathic) with ulcer of bilateral lower extremity: Secondary | ICD-10-CM | POA: Diagnosis present

## 2019-01-31 DIAGNOSIS — L97522 Non-pressure chronic ulcer of other part of left foot with fat layer exposed: Secondary | ICD-10-CM | POA: Diagnosis not present

## 2019-01-31 DIAGNOSIS — L97812 Non-pressure chronic ulcer of other part of right lower leg with fat layer exposed: Secondary | ICD-10-CM | POA: Diagnosis not present

## 2019-01-31 DIAGNOSIS — L97212 Non-pressure chronic ulcer of right calf with fat layer exposed: Secondary | ICD-10-CM | POA: Diagnosis not present

## 2019-01-31 DIAGNOSIS — L97822 Non-pressure chronic ulcer of other part of left lower leg with fat layer exposed: Secondary | ICD-10-CM | POA: Diagnosis not present

## 2019-02-05 ENCOUNTER — Emergency Department (HOSPITAL_COMMUNITY): Payer: Medicare Other

## 2019-02-05 ENCOUNTER — Inpatient Hospital Stay (HOSPITAL_COMMUNITY)
Admission: EM | Admit: 2019-02-05 | Discharge: 2019-02-09 | DRG: 551 | Disposition: A | Discharge status: Hospice | Payer: Medicare Other | Attending: Internal Medicine | Admitting: Internal Medicine

## 2019-02-05 ENCOUNTER — Other Ambulatory Visit: Payer: Self-pay

## 2019-02-05 ENCOUNTER — Encounter (HOSPITAL_COMMUNITY): Payer: Self-pay

## 2019-02-05 DIAGNOSIS — Z823 Family history of stroke: Secondary | ICD-10-CM

## 2019-02-05 DIAGNOSIS — S22059A Unspecified fracture of T5-T6 vertebra, initial encounter for closed fracture: Principal | ICD-10-CM | POA: Diagnosis present

## 2019-02-05 DIAGNOSIS — E039 Hypothyroidism, unspecified: Secondary | ICD-10-CM | POA: Diagnosis present

## 2019-02-05 DIAGNOSIS — I739 Peripheral vascular disease, unspecified: Secondary | ICD-10-CM | POA: Diagnosis present

## 2019-02-05 DIAGNOSIS — I4891 Unspecified atrial fibrillation: Secondary | ICD-10-CM | POA: Diagnosis present

## 2019-02-05 DIAGNOSIS — Z7901 Long term (current) use of anticoagulants: Secondary | ICD-10-CM

## 2019-02-05 DIAGNOSIS — R222 Localized swelling, mass and lump, trunk: Secondary | ICD-10-CM | POA: Diagnosis not present

## 2019-02-05 DIAGNOSIS — R0902 Hypoxemia: Secondary | ICD-10-CM

## 2019-02-05 DIAGNOSIS — Z515 Encounter for palliative care: Secondary | ICD-10-CM

## 2019-02-05 DIAGNOSIS — Z66 Do not resuscitate: Secondary | ICD-10-CM | POA: Diagnosis present

## 2019-02-05 DIAGNOSIS — L8990 Pressure ulcer of unspecified site, unspecified stage: Secondary | ICD-10-CM

## 2019-02-05 DIAGNOSIS — F419 Anxiety disorder, unspecified: Secondary | ICD-10-CM | POA: Diagnosis present

## 2019-02-05 DIAGNOSIS — M532X2 Spinal instabilities, cervical region: Secondary | ICD-10-CM | POA: Diagnosis not present

## 2019-02-05 DIAGNOSIS — J9859 Other diseases of mediastinum, not elsewhere classified: Secondary | ICD-10-CM | POA: Diagnosis not present

## 2019-02-05 DIAGNOSIS — Y92099 Unspecified place in other non-institutional residence as the place of occurrence of the external cause: Secondary | ICD-10-CM

## 2019-02-05 DIAGNOSIS — Z8249 Family history of ischemic heart disease and other diseases of the circulatory system: Secondary | ICD-10-CM | POA: Diagnosis not present

## 2019-02-05 DIAGNOSIS — Z880 Allergy status to penicillin: Secondary | ICD-10-CM

## 2019-02-05 DIAGNOSIS — S12600A Unspecified displaced fracture of seventh cervical vertebra, initial encounter for closed fracture: Secondary | ICD-10-CM | POA: Diagnosis present

## 2019-02-05 DIAGNOSIS — J90 Pleural effusion, not elsewhere classified: Secondary | ICD-10-CM | POA: Diagnosis not present

## 2019-02-05 DIAGNOSIS — I5032 Chronic diastolic (congestive) heart failure: Secondary | ICD-10-CM | POA: Diagnosis present

## 2019-02-05 DIAGNOSIS — S0003XA Contusion of scalp, initial encounter: Secondary | ICD-10-CM | POA: Diagnosis present

## 2019-02-05 DIAGNOSIS — E785 Hyperlipidemia, unspecified: Secondary | ICD-10-CM | POA: Diagnosis present

## 2019-02-05 DIAGNOSIS — S22000A Wedge compression fracture of unspecified thoracic vertebra, initial encounter for closed fracture: Secondary | ICD-10-CM | POA: Diagnosis present

## 2019-02-05 DIAGNOSIS — Z881 Allergy status to other antibiotic agents status: Secondary | ICD-10-CM | POA: Diagnosis not present

## 2019-02-05 DIAGNOSIS — S22020A Wedge compression fracture of second thoracic vertebra, initial encounter for closed fracture: Secondary | ICD-10-CM | POA: Diagnosis not present

## 2019-02-05 DIAGNOSIS — I8393 Asymptomatic varicose veins of bilateral lower extremities: Secondary | ICD-10-CM | POA: Diagnosis present

## 2019-02-05 DIAGNOSIS — Z882 Allergy status to sulfonamides status: Secondary | ICD-10-CM

## 2019-02-05 DIAGNOSIS — Z7189 Other specified counseling: Secondary | ICD-10-CM | POA: Diagnosis not present

## 2019-02-05 DIAGNOSIS — S129XXA Fracture of neck, unspecified, initial encounter: Secondary | ICD-10-CM | POA: Diagnosis present

## 2019-02-05 DIAGNOSIS — Z885 Allergy status to narcotic agent status: Secondary | ICD-10-CM | POA: Diagnosis not present

## 2019-02-05 DIAGNOSIS — W1830XA Fall on same level, unspecified, initial encounter: Secondary | ICD-10-CM | POA: Diagnosis present

## 2019-02-05 DIAGNOSIS — I878 Other specified disorders of veins: Secondary | ICD-10-CM | POA: Diagnosis present

## 2019-02-05 DIAGNOSIS — R1310 Dysphagia, unspecified: Secondary | ICD-10-CM | POA: Diagnosis present

## 2019-02-05 DIAGNOSIS — M1612 Unilateral primary osteoarthritis, left hip: Secondary | ICD-10-CM | POA: Diagnosis present

## 2019-02-05 DIAGNOSIS — I482 Chronic atrial fibrillation, unspecified: Secondary | ICD-10-CM | POA: Diagnosis present

## 2019-02-05 DIAGNOSIS — S0990XA Unspecified injury of head, initial encounter: Secondary | ICD-10-CM | POA: Diagnosis not present

## 2019-02-05 DIAGNOSIS — J9601 Acute respiratory failure with hypoxia: Secondary | ICD-10-CM | POA: Diagnosis present

## 2019-02-05 DIAGNOSIS — L89301 Pressure ulcer of unspecified buttock, stage 1: Secondary | ICD-10-CM | POA: Diagnosis present

## 2019-02-05 DIAGNOSIS — H9190 Unspecified hearing loss, unspecified ear: Secondary | ICD-10-CM | POA: Diagnosis present

## 2019-02-05 DIAGNOSIS — I110 Hypertensive heart disease with heart failure: Secondary | ICD-10-CM | POA: Diagnosis present

## 2019-02-05 DIAGNOSIS — W19XXXA Unspecified fall, initial encounter: Secondary | ICD-10-CM

## 2019-02-05 DIAGNOSIS — F0390 Unspecified dementia without behavioral disturbance: Secondary | ICD-10-CM | POA: Diagnosis present

## 2019-02-05 HISTORY — DX: Depression, unspecified: F32.A

## 2019-02-05 HISTORY — DX: Unspecified dementia, unspecified severity, without behavioral disturbance, psychotic disturbance, mood disturbance, and anxiety: F03.90

## 2019-02-05 HISTORY — DX: Chronic obstructive pulmonary disease, unspecified: J44.9

## 2019-02-05 HISTORY — DX: Anxiety disorder, unspecified: F41.9

## 2019-02-05 HISTORY — DX: Major depressive disorder, single episode, unspecified: F32.9

## 2019-02-05 LAB — CBC
HCT: 38 % (ref 36.0–46.0)
Hemoglobin: 11.9 g/dL — ABNORMAL LOW (ref 12.0–15.0)
MCH: 30.3 pg (ref 26.0–34.0)
MCHC: 31.3 g/dL (ref 30.0–36.0)
MCV: 96.7 fL (ref 80.0–100.0)
Platelets: 208 10*3/uL (ref 150–400)
RBC: 3.93 MIL/uL (ref 3.87–5.11)
RDW: 14.1 % (ref 11.5–15.5)
WBC: 17.4 10*3/uL — ABNORMAL HIGH (ref 4.0–10.5)
nRBC: 0 % (ref 0.0–0.2)

## 2019-02-05 LAB — COMPREHENSIVE METABOLIC PANEL
ALK PHOS: 87 U/L (ref 38–126)
ALT: 26 U/L (ref 0–44)
AST: 33 U/L (ref 15–41)
Albumin: 3.3 g/dL — ABNORMAL LOW (ref 3.5–5.0)
Anion gap: 11 (ref 5–15)
BUN: 26 mg/dL — ABNORMAL HIGH (ref 8–23)
CALCIUM: 8.2 mg/dL — AB (ref 8.9–10.3)
CO2: 24 mmol/L (ref 22–32)
Chloride: 101 mmol/L (ref 98–111)
Creatinine, Ser: 0.94 mg/dL (ref 0.44–1.00)
GFR calc Af Amer: >60 mL/min (ref >60)
GFR calc non Af Amer: 52 mL/min — ABNORMAL LOW (ref >60)
Glucose, Bld: 128 mg/dL — ABNORMAL HIGH (ref 70–99)
Potassium: 3.9 mmol/L (ref 3.5–5.1)
Sodium: 136 mmol/L (ref 135–145)
Total Bilirubin: 0.8 mg/dL (ref 0.3–1.2)
Total Protein: 7.3 g/dL (ref 6.5–8.1)

## 2019-02-05 MED ORDER — ONDANSETRON HCL 4 MG/2ML IJ SOLN: 4.0000 mg | Freq: Four times a day (QID) | INTRAMUSCULAR | Status: DC | PRN

## 2019-02-05 MED ORDER — OXYCODONE HCL 5 MG PO TABS: 5.0000 mg | ORAL_TABLET | ORAL | Status: DC | PRN

## 2019-02-05 MED ORDER — DEXTROSE-NACL 5-0.45 % IV SOLN
INTRAVENOUS | Status: AC
  Administered 2019-02-05 – 2019-02-06 (×2): via INTRAVENOUS

## 2019-02-05 MED ORDER — ONDANSETRON HCL 4 MG PO TABS: 4.0000 mg | ORAL_TABLET | Freq: Four times a day (QID) | ORAL | Status: DC | PRN

## 2019-02-05 MED ORDER — MORPHINE SULFATE (PF) 4 MG/ML IV SOLN
4.0000 mg | Freq: Once | INTRAVENOUS | Status: AC
  Administered 2019-02-05: 4 mg via INTRAVENOUS
  Filled 2019-02-05: qty 1

## 2019-02-05 MED ORDER — SODIUM CHLORIDE (PF) 0.9 % IJ SOLN
INTRAMUSCULAR | Status: AC
  Filled 2019-02-05: qty 50

## 2019-02-05 MED ORDER — MORPHINE SULFATE (PF) 2 MG/ML IV SOLN
2.0000 mg | INTRAVENOUS | Status: DC | PRN
  Administered 2019-02-05 (×4): 2 mg via INTRAVENOUS
  Filled 2019-02-05 (×4): qty 1

## 2019-02-05 MED ORDER — HYDROMORPHONE HCL 1 MG/ML IJ SOLN
0.5000 mg | INTRAMUSCULAR | Status: DC | PRN
  Administered 2019-02-07 – 2019-02-09 (×10): 0.5 mg via INTRAVENOUS
  Filled 2019-02-05 (×11): qty 0.5

## 2019-02-05 MED ORDER — IOHEXOL 300 MG/ML  SOLN
75.0000 mL | Freq: Once | INTRAMUSCULAR | Status: AC | PRN
  Administered 2019-02-05: 75 mL via INTRAVENOUS

## 2019-02-05 MED ORDER — ALBUTEROL SULFATE (2.5 MG/3ML) 0.083% IN NEBU: 2.5000 mg | INHALATION_SOLUTION | Freq: Four times a day (QID) | RESPIRATORY_TRACT | Status: DC | PRN

## 2019-02-05 NOTE — ED Provider Notes (Addendum)
[facility]-EMERGENCY DEPT Provider Note   CSN: [id number] Arrival date & time: 02/05/19  4098    History   Chief Complaint Chief Complaint  Patient presents with  . Fall    HPI Brittany Schultz is a 85 y.o. adult.     HPI Patient is a 85 year old female who presents the emergency department with complaints of neck pain after a fall backwards at the assisted living facility today.  She walks with a walker.  She was walking assisted.  She fell backwards for some reason and struck the back of her head and complains of neck pain at this time.  She has no weakness of her arms or legs at this time.  She denies pain in her hips or arms.  Denies chest or abdominal pain.  Patient had been in her normal state of health up to this point today.  The patient's granddaughter is at the bedside   Past Medical History:  Diagnosis Date  . A-fib [facility])    hospitalized 04/2009  . CHF (congestive heart failure) (HCC)   . Deaf   . Dermatitis 01/2014  . History of shingles   . Hx of colonic polyps    Adenomatous polyps  . Hyperlipidemia   . Hypertension   . Lack of coordination   . Long term (current) use of anticoagulants    warfarin for stroke risk reduction re: AF  . Muscle weakness (generalized)   . Open wound of left lower leg 02/2104  . Osteoarthritis of left hip   . Other malaise and fatigue   . Peripheral vascular disease, unspecified (HCC)   . PVD (peripheral vascular disease) (HCC)   . Shortness of breath 03/23/14  . Thrombocytopenia (HCC) April 2015   144,000  . Unspecified hypothyroidism   . Unstable gait   . Varicose veins of lower extremities with ulcer (HCC)   . Venous insufficiency of both lower extremities    vascular evaluation 10/2013, 01/2014 Dr.Powell  . Venous stasis ulcer of left lower extremity (HCC)   . Vitamin D deficiency     Patient Active Problem List   Diagnosis Date Noted  . Unstable cervical spine 02/05/2019  . Renal failure 11/19/2018   . Encephalopathy in sepsis 10/31/2018  . Sepsis (HCC) 10/29/2018  . Sepsis due to cellulitis (HCC) 10/29/2018  . Upper airway cough syndrome 07/01/2016  . Pleural effusion on right 07/01/2016  . Acute CHF (HCC) 10/31/2014  . Acute encephalopathy 10/31/2014  . Atrial fibrillation (HCC) 10/31/2014  . Subacute confusional state 10/30/2014  . Cellulitis 10/30/2014  . 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 Lymphedema 04/20/2014  . Debility 03/30/2014  . Shortness of breath 03/23/2014  . Deaf   . Varicose veins of lower extremities with ulcer (HCC)   . Peripheral vascular disease (HCC)   . Muscle weakness (generalized)   . Other malaise and fatigue   . Lack of coordination   . Unstable gait   . Hyperlipidemia   . Venous insufficiency of both lower extremities   . Open wound of left lower leg   . Hypertension   . Osteoarthritis of left hip   . Long term current use of anticoagulant therapy   . Thrombocytopenia (HCC) 03/15/2014  . Cellulitis and abscess of leg, except foot 02/25/2014  . Generalized weakness 02/21/2014  . Unspecified constipation 02/21/2014  . Dermatitis 02/21/2014  . Unspecified hypothyroidism 02/21/2014  . Essential hypertension, benign 02/19/2014  . Congestive  heart failure (HCC) 02/19/2014  . A-fib (HCC) 02/19/2014  . Macular rash 02/19/2014  . Osteoarthritis 02/19/2014  . Hypothyroidism 02/19/2014  . Venous stasis ulcer of left lower extremity (HCC) 02/19/2014    Past Surgical History:  Procedure Laterality Date  . CATARACT EXTRACTION W/ INTRAOCULAR LENS  IMPLANT, BILATERAL Bilateral      OB History   No obstetric history on file.      Home Medications    Prior to Admission medications   Medication Sig Start Date End Date Taking? Authorizing Provider  acetaminophen (TYLENOL) 325 MG tablet Take 650 mg by mouth 3 (three) times daily.  03/17/14  Yes Krell, Claudette T, NP  albuterol (PROVENTIL) (2.5  MG/3ML) 0.083% nebulizer solution Take 3 mLs (2.5 mg total) by nebulization every 6 (six) hours as needed for wheezing or shortness of breath. 11/04/18  Yes Sheikh, Omair Latif, DO  carvedilol (COREG) 6.25 MG tablet Take 6.25 mg by mouth daily.    Yes [provider]  furosemide (LASIX) 40 MG tablet Take 1 tablet (40 mg total) by mouth daily. 11/02/14  Yes Leroy SeaSingh, Prashant K, MD  hydrALAZINE (APRESOLINE) 25 MG tablet Take 1 tablet (25 mg total) by mouth every 8 (eight) hours. Patient taking differently: Take 25 mg by mouth 3 (three) times daily.  11/02/14  Yes Leroy SeaSingh, Prashant K, MD  hydrOXYzine (ATARAX/VISTARIL) 25 MG tablet Take 1 tablet (25 mg total) by mouth every 8 (eight) hours as needed. Patient taking differently: Take 25 mg by mouth every 8 (eight) hours as needed for itching.  04/20/14  Yes Krell, Claudette T, NP  LORazepam (ATIVAN) 0.5 MG tablet Take 0.25 mg by mouth every 12 (twelve) hours as needed for anxiety.   Yes [provider]  mineral oil external liquid Place 2 drops into both ears once a week.   Yes [provider]  Multiple Vitamin (MULTIVITAMIN) LIQD Take 15 mLs by mouth daily. 11/21/18  Yes Sheikh, Omair Latif, DO  Nutritional Supplements (JUICE PLUS FIBRE PO) Take 1 capsule by mouth 3 (three) times daily. Take one capsule of each three times a day [employer]   Yes [provider]  pantoprazole (PROTONIX) 40 MG tablet Take 1 tablet (40 mg total) by mouth daily. 11/21/18  Yes Sheikh, Omair Latif, DO  potassium chloride SA (K-DUR,KLOR-CON) 20 MEQ tablet Take 20 mEq by mouth daily. 07/01/18  Yes [provider]  sertraline (ZOLOFT) 100 MG tablet Take 100 mg by mouth daily.   Yes [provider]  spironolactone (ALDACTONE) 25 MG tablet Take 1 tablet (25 mg total) by mouth daily. 04/20/14  Yes Krell, Claudette T, NP  thiamine 100 MG tablet Take 1 tablet (100 mg total) by mouth daily. 11/21/18  Yes Sheikh,  Omair Latif, DO  traMADol (ULTRAM) 50 MG tablet Take 1 tablet (50 mg total) by mouth as directed. Take 1 tablet BID & Take 1 tablet daily PRN for Pain. Patient taking differently: Take 50 mg by mouth 2 (two) times daily. Can also take 1 extra tablet daily PRN break through pain 11/21/18  Yes Sheikh, Omair Latif, DO  VITAMIN D, ERGOCALCIFEROL, PO Take 1,000 Units by mouth daily.    Yes [provider]  Zinc Oxide (DESITIN) 13 % CREA Apply 1 application topically 2 (two) times daily.   Yes [provider]    Family History Family History  Problem Relation Age of Onset  . Hypertension Mother   . Stroke Mother   .  Heart disease Mother   . Hypertension Father   . Hypertension Brother     Social History Social History   Tobacco Use  . Smoking status: Never Smoker  . Smokeless tobacco: Never Used  Substance Use Topics  . Alcohol use: No  . Drug use: No     Allergies   Sulfa antibiotics; Codeine; Penicillins; Sulfonylureas; and Clindamycin/lincomycin   Review of Systems Review of Systems  All other systems reviewed and are negative.    Physical Exam Updated Vital Signs BP 126/78 (BP Location: Right Arm)   Pulse 97   Temp 98.6 F (37 C) (Oral)   Resp 16   Ht  (1.676 m)   Wt 70.8 kg   SpO2 93%   BMI 25.18 kg/m   Physical Exam Vitals signs and nursing note reviewed.  HENT:     Head: Normocephalic.     Comments: Small posterior scalp hematoma Neck:     Comments: Cervical tenderness without cervical step-off. Cardiovascular:     Rate and Rhythm: Normal rate and regular rhythm.  Pulmonary:     Effort: Pulmonary effort is normal. No respiratory distress.     Breath sounds: Normal breath sounds.  Abdominal:     General: There is no distension.     Tenderness: There is no abdominal tenderness.  Musculoskeletal: Normal range of motion.     Comments: Upper thoracic tenderness without thoracic step-off.  No lower thoracic or lumbar tenderness or  step-off.  Skin:    General: Skin is warm and dry.  Neurological:     General: No focal deficit present.     Mental Status: He is alert and oriented to person, place, and time.      ED Treatments / Results  Labs (all labs ordered are listed, but only abnormal results are displayed) Labs Reviewed  CBC - Abnormal; Notable for the following components:      Result Value   WBC 17.4 (*)    Hemoglobin 11.9 (*)    All other components within normal limits  COMPREHENSIVE METABOLIC PANEL - Abnormal; Notable for the following components:   Glucose, Bld 128 (*)    BUN 26 (*)    Calcium 8.2 (*)    Albumin 3.3 (*)    GFR calc non Af Amer 52 (*)    All other components within normal limits    EKG None  Radiology Ct Head Wo Contrast  Result Date: 02/05/2019 CLINICAL DATA:  Larey Seat backwards. Witnessed fall. C-spine trauma. High clinical risk. EXAM: CT HEAD WITHOUT CONTRAST CT CERVICAL SPINE WITHOUT CONTRAST TECHNIQUE: Multidetector CT imaging of the head and cervical spine was performed following the standard protocol without intravenous contrast. Multiplanar CT image reconstructions of the cervical spine were also generated. COMPARISON:  None. FINDINGS: CT HEAD FINDINGS Brain: Mild cerebral atrophy. Low-density in the left cerebral hemisphere is nonspecific and could be related to artifact. Difficult to exclude age-indeterminate infarct in this area. Negative for acute hemorrhage, mass lesion or midline shift or hydrocephalus. Subtle low-density in the white matter is suggestive for chronic changes. Vascular: No hyperdense vessel or unexpected calcification. Skull: No acute abnormality. Sinuses/Orbits: Minimal mucosal thickening in the paranasal sinuses. Other: Posterior scalp hematoma best seen on sequence 4, image 31. This scalp hematoma measures up to 3.6 cm based on the sagittal reformats, sequence 7, image 39. CT CERVICAL SPINE FINDINGS Alignment: Marked widening of the disc space at C6-C7 with  associated mild anterolisthesis. Skull base and vertebrae: Multiple cervical spine  fractures. Nondisplaced fracture involving the C5 spinous process. Bilateral laminal fractures at C6. Right laminal fracture C6 is displaced. Mildly distracted fracture involving the C6 spinous process. Right C7 facet intra-articular fracture. Displaced fracture of the C7 spinous process. Displaced fracture of the T1 spinous process. Anterior wedge compression fracture at C2. Soft tissues and spinal canal: Soft tissue swelling centered around C6-C7 in the cervicothoracic junction. Disc levels: Marked disc widening at C6-C7 suggesting disruption of the anterior and posterior longitudinal ligaments at this level. Upper chest: Extensive calcifications and scarring at the lung apices. There is concern for hematoma or abnormal soft tissue in the upper mediastinum. Small amount of right pleural fluid or thickening. Other: None IMPRESSION: 1. Unstable cervical spine injury with multiple fractures. Evidence for disruption of the anterior and posterior longitudinal ligaments at C6-C7. There is marked widening of the C6-C7 disc space with anterolisthesis. 2. Bilateral laminal fractures at C6. Right intra-articular facet fracture at C7. Spinous process fractures at C5, C6, C7 and T1. 3. Anterior wedge compression fracture at T2. 4. Cannot exclude upper mediastinal hematoma. Right pleural fluid or pleural thickening. 5. Negative for intracranial hemorrhage. 6. Indeterminate low-density in the left cerebellar hemisphere. This area is difficult to evaluate due to artifact. Age-indeterminate infarct cannot be excluded. 7. Large scalp hematoma along the posterior vertex. No underlying fracture. These results were called by telephone at the time of interpretation on 02/05/2019 at 12:22 pm to Dr. Azalia BilisKEVIN Tehila Sokolow , who verbally acknowledged these results. Electronically Signed   By: Richarda OverlieAdam  Henn M.D.   On: 02/05/2019 12:29   Ct Chest W Contrast  Result  Date: 02/05/2019 CLINICAL DATA:  Shortness of breath and chest pain. Status post witnessed fall. EXAM: CT CHEST WITH CONTRAST TECHNIQUE: Multidetector CT imaging of the chest was performed during intravenous contrast administration. CONTRAST:  75mL OMNIPAQUE IOHEXOL 300 MG/ML  SOLN COMPARISON:  None. FINDINGS: Cardiovascular: Heart is enlarged. Atherosclerotic calcification is noted in the wall of the thoracic aorta. Coronary artery calcification is evident. No substantial pericardial effusion. Mediastinum/Nodes: Large soft tissue mass is identified in the central upper mediastinum, apparently incorporating the proximal esophagus. This lesion measures 3.7 x 3.8 x 4.1 cm. Possible lymph node or second area of esophageal enlargement posterior to the carina, measuring 2.8 x 3.3 cm. There is no hilar lymphadenopathy. There is no axillary lymphadenopathy. Lungs/Pleura: The central tracheobronchial airways are patent. Interlobular septal thickening noted in the lungs bilaterally with patchy areas of ground-glass attenuation. Areas of mosaic attenuation in the lungs bilaterally are nonspecific but likely reflect small airways disease. Loculated fluid noted in the right major fissure the and in the posterior hemithorax. Fine detail of lung parenchyma in the bases is obscured by breathing motion. Small free-flowing left pleural effusion evident. Upper Abdomen: Cortical scarring noted left kidney. Otherwise unremarkable Musculoskeletal: No worrisome lytic or sclerotic osseous abnormality. See thoracic spine CT performed today for definitive characterization of thoracic spinal anatomy. IMPRESSION: 1. Soft tissue masses identified in the mediastinum. The more cranial of the 2 incorporates the proximal esophagus and esophageal tissue can not be discriminated as separate from this soft tissue. This may be a proximal esophageal mass or bulky mediastinal lymphadenopathy markedly compressing the esophagus. A second soft tissue mass  is identified posterior to the carina, again without ability to discriminate the esophagus as a separate structure. Barium swallow or upper endoscopy may prove helpful to further evaluate. 2. Moderate loculated right pleural effusion including fluid loculated in the right major fissure. Small free-flowing left  pleural effusion evident. 3. Collapse/consolidation in the right middle and lower lobes. 4.  Aortic Atherosclerois (ICD10-170.0) 5. See thoracic spine CT report from today for definitive characterization of thoracic spinal anatomy. Electronically Signed   By: Kennith Center M.D.   On: 02/05/2019 14:54   Ct Cervical Spine Wo Contrast  Result Date: 02/05/2019 CLINICAL DATA:  Larey Seat backwards. Witnessed fall. C-spine trauma. High clinical risk. EXAM: CT HEAD WITHOUT CONTRAST CT CERVICAL SPINE WITHOUT CONTRAST TECHNIQUE: Multidetector CT imaging of the head and cervical spine was performed following the standard protocol without intravenous contrast. Multiplanar CT image reconstructions of the cervical spine were also generated. COMPARISON:  None. FINDINGS: CT HEAD FINDINGS Brain: Mild cerebral atrophy. Low-density in the left cerebral hemisphere is nonspecific and could be related to artifact. Difficult to exclude age-indeterminate infarct in this area. Negative for acute hemorrhage, mass lesion or midline shift or hydrocephalus. Subtle low-density in the white matter is suggestive for chronic changes. Vascular: No hyperdense vessel or unexpected calcification. Skull: No acute abnormality. Sinuses/Orbits: Minimal mucosal thickening in the paranasal sinuses. Other: Posterior scalp hematoma best seen on sequence 4, image 31. This scalp hematoma measures up to 3.6 cm based on the sagittal reformats, sequence 7, image 39. CT CERVICAL SPINE FINDINGS Alignment: Marked widening of the disc space at C6-C7 with associated mild anterolisthesis. Skull base and vertebrae: Multiple cervical spine fractures. Nondisplaced  fracture involving the C5 spinous process. Bilateral laminal fractures at C6. Right laminal fracture C6 is displaced. Mildly distracted fracture involving the C6 spinous process. Right C7 facet intra-articular fracture. Displaced fracture of the C7 spinous process. Displaced fracture of the T1 spinous process. Anterior wedge compression fracture at C2. Soft tissues and spinal canal: Soft tissue swelling centered around C6-C7 in the cervicothoracic junction. Disc levels: Marked disc widening at C6-C7 suggesting disruption of the anterior and posterior longitudinal ligaments at this level. Upper chest: Extensive calcifications and scarring at the lung apices. There is concern for hematoma or abnormal soft tissue in the upper mediastinum. Small amount of right pleural fluid or thickening. Other: None IMPRESSION: 1. Unstable cervical spine injury with multiple fractures. Evidence for disruption of the anterior and posterior longitudinal ligaments at C6-C7. There is marked widening of the C6-C7 disc space with anterolisthesis. 2. Bilateral laminal fractures at C6. Right intra-articular facet fracture at C7. Spinous process fractures at C5, C6, C7 and T1. 3. Anterior wedge compression fracture at T2. 4. Cannot exclude upper mediastinal hematoma. Right pleural fluid or pleural thickening. 5. Negative for intracranial hemorrhage. 6. Indeterminate low-density in the left cerebellar hemisphere. This area is difficult to evaluate due to artifact. Age-indeterminate infarct cannot be excluded. 7. Large scalp hematoma along the posterior vertex. No underlying fracture. These results were called by telephone at the time of interpretation on 02/05/2019 at 12:22 pm to Dr. Azalia Bilis , who verbally acknowledged these results. Electronically Signed   By: Richarda Overlie M.D.   On: 02/05/2019 12:29   Ct Thoracic Spine Wo Contrast  Result Date: 02/05/2019 CLINICAL DATA:  Status post fall today. Thoracic spine pain. Initial encounter.  EXAM: CT THORACIC SPINE WITHOUT CONTRAST TECHNIQUE: Multidetector CT images of the thoracic were obtained using the standard protocol without intravenous contrast. COMPARISON:  PA and lateral chest 11/19/2018. FINDINGS: Alignment: Exaggeration of the normal thoracic kyphosis. No listhesis in the thoracic spine. Malalignment in the cervical spine is noted. Please see report of dedicated cervical spine CT scan. Vertebrae: There is a fracture of the spinous process of T1. The  patient also has a mild biconcave compression fracture of T2 with vertebral body height loss approximately 60%. Fracture margins are sharp consistent with acute injury. No involvement of the posterior elements is identified. Mild superior endplate compression fracture of T3 with vertebral body height loss of approximately 10% is also identified. No involvement of the posterior elements. No other fracture is identified. Paraspinal and other soft tissues: Small left and small to moderate right pleural effusions are identified. There is cardiomegaly. Extensive atherosclerosis is present. Disc levels: Scattered loss of disc space height is noted. IMPRESSION: Acute biconcave compression fracture of T2 with vertebral body height loss of up to 60%. Mild superior endplate compression fracture of T3 with vertebral body height loss of approximately 10%. No bony retropulsion or involvement of the posterior elements at either level. Fracture of the spinous process of T1. Cervical spine fractures are incompletely imaged. Please see report of dedicated cervical spine CT this same day. Osteopenia. Cardiomegaly. Atherosclerosis. Small left and small to moderate right pleural effusions. Electronically Signed   By: Drusilla Kanner M.D.   On: 02/05/2019 11:52   Dg Chest Portable 1 View  Result Date: 02/05/2019 CLINICAL DATA:  Status post fall. Hypoxia. EXAM: PORTABLE CHEST 1 VIEW COMPARISON:  11/19/2018 FINDINGS: Enlarged cardiac silhouette. Calcific  atherosclerotic disease of the aorta. There is no evidence of pneumothorax. Moderate right and small left pleural effusion. Pulmonary vascular congestion. Osseous structures are without acute abnormality. Soft tissues are grossly normal. IMPRESSION: 1. Enlarged cardiac silhouette with pulmonary vascular congestion. 2. Moderate right and small left pleural effusions. Electronically Signed   By: Ted Mcalpine M.D.   On: 02/05/2019 13:55    Procedures .Critical Care Performed by: Azalia Bilis, MD Authorized by: Azalia Bilis, MD   Critical care provider statement:    Critical care time (minutes):  45   Critical care was time spent personally by me on the following activities:  Discussions with consultants, evaluation of patient's response to treatment, examination of patient, ordering and performing treatments and interventions, ordering and review of laboratory studies, ordering and review of radiographic studies, pulse oximetry, re-evaluation of patient's condition, obtaining history from patient or surrogate and review of old charts   (including critical care time)  Medications Ordered in ED Medications  morphine 2 MG/ML injection 2 mg (2 mg Intravenous Given 02/05/19 1150)  sodium chloride (PF) 0.9 % injection (has no administration in time range)  morphine 4 MG/ML injection 4 mg (4 mg Intravenous Given 02/05/19 1318)  iohexol (OMNIPAQUE) 300 MG/ML solution 75 mL (75 mLs Intravenous Contrast Given 02/05/19 1425)     Initial Impression / Assessment and Plan / ED Course  I have reviewed the triage vital signs and the nursing notes.  Pertinent labs & imaging results that were available during my care of the patient were reviewed by me and considered in my medical decision making (see chart for details).        Patient with evidence of unstable cervical spine fracture.  Cervical spine immobilized in an Aspen collar.  Case was discussed with neurosurgery who personally reviewed the  films and given her advanced age and medical comorbidities do not think that she is a surgical candidate.  They are recommending cervical immobilization in an Aspen collar and outpatient follow-up.  Questionable mediastinal hematoma noted on initial imaging.  CT scan of the chest demonstrates new findings of several mediastinal masses near the esophagus.  Given her advanced age these are obviously concerning and add to the complexity  of her already difficult situation.   Consultants: Neurosurgery- [place] [facility]  Patient and family updated.    Final Clinical Impressions(s) / ED Diagnoses   Final diagnoses:  None    ED Discharge Orders    None       Azalia Bilis, MD 02/05/19 1549    Azalia Bilis, MD 02/06/19 [phone]

## 2019-02-05 NOTE — Progress Notes (Addendum)
  NEUROSURGERY PROGRESS NOTE   Received call from Dr Patria Mane, EDP at [facility], regarding patient.  Brittany Schultz is a 85 year old who was brought to ED after fall at assisted living facility. Medical history significant for afib on eliquis, CHF, HTN, depression, hypothyroidism.  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  Also noted on CT L spine are T1 and T2 fractures with 10% and 60% height loss respectively, without retropulsion.  By report she is neurologically intact. I have reviewed the imaging with attending Dr Conchita Paris. While she does have an unstable cervical spine fracture, given her age and medical history, the risks of surgery greatly outweigh the benefits and therefore we do not believe she is an operative candidate. Rec Aspen hard C collar which is to be worn at all times. Could consider palliative care consult due to injuries  I have reviewed the note above and agree with the impression and plan as documented in the note by Cindra Presume, PA-C: [phone] woman with significant medical comorbidities presenting after a fall, reportedly neurologically at baseline. CT demonstrates severe unstable C6-7 fracture. Under different circumstances this would require an extensive anterior and posterior stabilization surgery however given her age and underlying co-morbidities, I do not believe she could tolerate such a procedure without unacceptably high risk of serious morbidity or mortality. I therefore think that treatment in an Aspen cervical collar to be worn at all times is the most reasonable treatment.  Brittany Renshaw, MD [facility]

## 2019-02-05 NOTE — ED Notes (Signed)
Bed: WA24 Expected date:  Expected time:  Means of arrival:  Comments: EMS 

## 2019-02-05 NOTE — ED Notes (Signed)
Patient o2 sat 88% on arrival. Denies wearing o2 at home. 2L Howe applied, o2 SAT increased to 95%

## 2019-02-05 NOTE — ED Notes (Signed)
ED TO INPATIENT HANDOFF REPORT  Name/Age/Gender Brittany Schultz 85 y.o. adult  Code Status Code Status History    Date Active Date Inactive Code Status Order ID Comments User Context   11/19/2018 1225 11/21/2018 1838 DNR [id number]  Coralie Keens, MD Inpatient   10/29/2018 2309 11/04/2018 1439 DNR [id number]  Kathrynn Running, MD Inpatient   10/30/2014 1912 11/02/2014 2311 DNR [id number]  Doree Albee, MD Inpatient    Questions for Most Recent Historical Code Status (Order [id number])    Question Answer Comment   In the event of cardiac or respiratory ARREST Do not call a "code blue"    In the event of cardiac or respiratory ARREST Do not perform Intubation, CPR, defibrillation or ACLS    In the event of cardiac or respiratory ARREST Use medication by any route, position, wound care, and other measures to relive pain and suffering. May use oxygen, suction and manual treatment of airway obstruction as needed for comfort.       Home/SNF/Other Nursing Home  Chief Complaint fall   Level of Care/Admitting Diagnosis ED Disposition    ED Disposition Condition Comment   Admit  Hospital Area: [facility] [[id number]]  Level of Care: Med-Surg [16]  Diagnosis: Unstable cervical spine [[id number]]  Admitting Physician: Osvaldo Shipper [[id number]]  Attending Physician: Osvaldo Shipper [[id number]]  Estimated length of stay: past midnight tomorrow  Certification:: I certify this patient will need inpatient services for at least 2 midnights  PT Class (Do Not Modify): Inpatient [101]  PT Acc Code (Do Not Modify): Private [1]       Medical History Past Medical History:  Diagnosis Date  . A-fib [facility])    hospitalized 04/2009  . CHF (congestive heart failure) (HCC)   . Deaf   . Dermatitis 01/2014  . History of shingles   . Hx of colonic polyps    Adenomatous polyps  . Hyperlipidemia   . Hypertension   . Lack of coordination   . Long term (current) use of anticoagulants     warfarin for stroke risk reduction re: AF  . Muscle weakness (generalized)   . Open wound of left lower leg 02/2104  . Osteoarthritis of left hip   . Other malaise and fatigue   . Peripheral vascular disease, unspecified (HCC)   . PVD (peripheral vascular disease) (HCC)   . Shortness of breath 03/23/14  . Thrombocytopenia (HCC) April 2015   144,000  . Unspecified hypothyroidism   . Unstable gait   . Varicose veins of lower extremities with ulcer (HCC)   . Venous insufficiency of both lower extremities    vascular evaluation 10/2013, 01/2014 Dr.Powell  . Venous stasis ulcer of left lower extremity (HCC)   . Vitamin D deficiency     Allergies Allergies  Allergen Reactions  . Sulfa Antibiotics Rash    Rash w/ sulfonomides furosemide, meloxicam  . Codeine     unknown  . Penicillins     unknown  . Sulfonylureas     unknown  . Clindamycin/Lincomycin Itching and Rash    IV Location/Drains/Wounds Patient Lines/Drains/Airways Status   Active Line/Drains/Airways    Name:   Placement date:   Placement time:   Site:   Days:   Peripheral IV 02/05/19 Right Antecubital   02/05/19    1025    Antecubital   less than 1   Pressure Injury 11/20/18 Stage II -  Partial thickness loss of dermis presenting as a shallow open ulcer with a  red, pink wound bed without slough.   11/20/18    2300     77   Wound / Incision (Open or Dehisced) 11/01/14 Leg Anterior;Left   11/01/14    1111    Leg   1557   Wound / Incision (Open or Dehisced) 11/01/14 Leg Posterior;Left   11/01/14    1113    Leg   1557   Wound / Incision (Open or Dehisced) 11/01/14 Leg Right;Circumferential   11/01/14    1115    Leg   1557   Wound / Incision (Open or Dehisced) 10/30/18 Venous stasis ulcer Leg Left   10/30/18    2005    Leg   98   Wound / Incision (Open or Dehisced) 10/30/18 Venous stasis ulcer Leg Right   10/30/18    2005    Leg   98   Wound / Incision (Open or Dehisced) 11/19/18 Non-pressure wound Perineum Right;Left RED    11/19/18    0831    Perineum   78          Labs/Imaging Results for orders placed or performed during the hospital encounter of 02/05/19 (from the past 48 hour(s))  CBC     Status: Abnormal   Collection Time: 02/05/19  1:01 PM  Result Value Ref Range   WBC 17.4 (H) 4.0 - 10.5 K/uL   RBC 3.93 3.87 - 5.11 MIL/uL   Hemoglobin 11.9 (L) 12.0 - 15.0 g/dL   HCT 14.7 82.9 - 56.2 %   MCV 96.7 80.0 - 100.0 fL   MCH 30.3 26.0 - 34.0 pg   MCHC 31.3 30.0 - 36.0 g/dL   RDW 13.0 86.5 - 78.4 %   Platelets 208 150 - 400 K/uL   nRBC 0.0 0.0 - 0.2 %    Comment: Performed at [facility], [address], [place], [place] [address]  Comprehensive metabolic panel     Status: Abnormal   Collection Time: 02/05/19  1:01 PM  Result Value Ref Range   Sodium 136 135 - 145 mmol/L   Potassium 3.9 3.5 - 5.1 mmol/L   Chloride 101 98 - 111 mmol/L   CO2 24 22 - 32 mmol/L   Glucose, Bld 128 (H) 70 - 99 mg/dL   BUN 26 (H) 8 - 23 mg/dL   Creatinine, Ser 5.28 0.44 - 1.00 mg/dL   Calcium 8.2 (L) 8.9 - 10.3 mg/dL   Total Protein 7.3 6.5 - 8.1 g/dL   Albumin 3.3 (L) 3.5 - 5.0 g/dL   AST 33 15 - 41 U/L   ALT 26 0 - 44 U/L   Alkaline Phosphatase 87 38 - 126 U/L   Total Bilirubin 0.8 0.3 - 1.2 mg/dL   GFR calc non Af Amer 52 (L) >60 mL/min   GFR calc Af Amer >60 >60 mL/min   Anion gap 11 5 - 15    Comment: Performed at [facility], [address], [place], [place] [address]   Ct Head Wo Contrast  Result Date: 02/05/2019 CLINICAL DATA:  Larey Seat backwards. Witnessed fall. C-spine trauma. High clinical risk. EXAM: CT HEAD WITHOUT CONTRAST CT CERVICAL SPINE WITHOUT CONTRAST TECHNIQUE: Multidetector CT imaging of the head and cervical spine was performed following the standard protocol without intravenous contrast. Multiplanar CT image reconstructions of the cervical spine were also generated. COMPARISON:  None. FINDINGS: CT HEAD FINDINGS Brain: Mild cerebral atrophy. Low-density in  the left cerebral hemisphere is nonspecific and could be related  to artifact. Difficult to exclude age-indeterminate infarct in this area. Negative for acute hemorrhage, mass lesion or midline shift or hydrocephalus. Subtle low-density in the white matter is suggestive for chronic changes. Vascular: No hyperdense vessel or unexpected calcification. Skull: No acute abnormality. Sinuses/Orbits: Minimal mucosal thickening in the paranasal sinuses. Other: Posterior scalp hematoma best seen on sequence 4, image 31. This scalp hematoma measures up to 3.6 cm based on the sagittal reformats, sequence 7, image 39. CT CERVICAL SPINE FINDINGS Alignment: Marked widening of the disc space at C6-C7 with associated mild anterolisthesis. Skull base and vertebrae: Multiple cervical spine fractures. Nondisplaced fracture involving the C5 spinous process. Bilateral laminal fractures at C6. Right laminal fracture C6 is displaced. Mildly distracted fracture involving the C6 spinous process. Right C7 facet intra-articular fracture. Displaced fracture of the C7 spinous process. Displaced fracture of the T1 spinous process. Anterior wedge compression fracture at C2. Soft tissues and spinal canal: Soft tissue swelling centered around C6-C7 in the cervicothoracic junction. Disc levels: Marked disc widening at C6-C7 suggesting disruption of the anterior and posterior longitudinal ligaments at this level. Upper chest: Extensive calcifications and scarring at the lung apices. There is concern for hematoma or abnormal soft tissue in the upper mediastinum. Small amount of right pleural fluid or thickening. Other: None IMPRESSION: 1. Unstable cervical spine injury with multiple fractures. Evidence for disruption of the anterior and posterior longitudinal ligaments at C6-C7. There is marked widening of the C6-C7 disc space with anterolisthesis. 2. Bilateral laminal fractures at C6. Right intra-articular facet fracture at C7. Spinous process  fractures at C5, C6, C7 and T1. 3. Anterior wedge compression fracture at T2. 4. Cannot exclude upper mediastinal hematoma. Right pleural fluid or pleural thickening. 5. Negative for intracranial hemorrhage. 6. Indeterminate low-density in the left cerebellar hemisphere. This area is difficult to evaluate due to artifact. Age-indeterminate infarct cannot be excluded. 7. Large scalp hematoma along the posterior vertex. No underlying fracture. These results were called by telephone at the time of interpretation on 02/05/2019 at 12:22 pm to Dr. Azalia Bilis , who verbally acknowledged these results. Electronically Signed   By: Richarda Overlie M.D.   On: 02/05/2019 12:29   Ct Chest W Contrast  Result Date: 02/05/2019 CLINICAL DATA:  Shortness of breath and chest pain. Status post witnessed fall. EXAM: CT CHEST WITH CONTRAST TECHNIQUE: Multidetector CT imaging of the chest was performed during intravenous contrast administration. CONTRAST:  69mL OMNIPAQUE IOHEXOL 300 MG/ML  SOLN COMPARISON:  None. FINDINGS: Cardiovascular: Heart is enlarged. Atherosclerotic calcification is noted in the wall of the thoracic aorta. Coronary artery calcification is evident. No substantial pericardial effusion. Mediastinum/Nodes: Large soft tissue mass is identified in the central upper mediastinum, apparently incorporating the proximal esophagus. This lesion measures 3.7 x 3.8 x 4.1 cm. Possible lymph node or second area of esophageal enlargement posterior to the carina, measuring 2.8 x 3.3 cm. There is no hilar lymphadenopathy. There is no axillary lymphadenopathy. Lungs/Pleura: The central tracheobronchial airways are patent. Interlobular septal thickening noted in the lungs bilaterally with patchy areas of ground-glass attenuation. Areas of mosaic attenuation in the lungs bilaterally are nonspecific but likely reflect small airways disease. Loculated fluid noted in the right major fissure the and in the posterior hemithorax. Fine detail  of lung parenchyma in the bases is obscured by breathing motion. Small free-flowing left pleural effusion evident. Upper Abdomen: Cortical scarring noted left kidney. Otherwise unremarkable Musculoskeletal: No worrisome lytic or sclerotic osseous abnormality. See thoracic spine CT performed today for  definitive characterization of thoracic spinal anatomy. IMPRESSION: 1. Soft tissue masses identified in the mediastinum. The more cranial of the 2 incorporates the proximal esophagus and esophageal tissue can not be discriminated as separate from this soft tissue. This may be a proximal esophageal mass or bulky mediastinal lymphadenopathy markedly compressing the esophagus. A second soft tissue mass is identified posterior to the carina, again without ability to discriminate the esophagus as a separate structure. Barium swallow or upper endoscopy may prove helpful to further evaluate. 2. Moderate loculated right pleural effusion including fluid loculated in the right major fissure. Small free-flowing left pleural effusion evident. 3. Collapse/consolidation in the right middle and lower lobes. 4.  Aortic Atherosclerois (ICD10-170.0) 5. See thoracic spine CT report from today for definitive characterization of thoracic spinal anatomy. Electronically Signed   By: Kennith CenterEric  Mansell M.D.   On: 02/05/2019 14:54   Ct Cervical Spine Wo Contrast  Result Date: 02/05/2019 CLINICAL DATA:  Larey SeatFell backwards. Witnessed fall. C-spine trauma. High clinical risk. EXAM: CT HEAD WITHOUT CONTRAST CT CERVICAL SPINE WITHOUT CONTRAST TECHNIQUE: Multidetector CT imaging of the head and cervical spine was performed following the standard protocol without intravenous contrast. Multiplanar CT image reconstructions of the cervical spine were also generated. COMPARISON:  None. FINDINGS: CT HEAD FINDINGS Brain: Mild cerebral atrophy. Low-density in the left cerebral hemisphere is nonspecific and could be related to artifact. Difficult to exclude  age-indeterminate infarct in this area. Negative for acute hemorrhage, mass lesion or midline shift or hydrocephalus. Subtle low-density in the white matter is suggestive for chronic changes. Vascular: No hyperdense vessel or unexpected calcification. Skull: No acute abnormality. Sinuses/Orbits: Minimal mucosal thickening in the paranasal sinuses. Other: Posterior scalp hematoma best seen on sequence 4, image 31. This scalp hematoma measures up to 3.6 cm based on the sagittal reformats, sequence 7, image 39. CT CERVICAL SPINE FINDINGS Alignment: Marked widening of the disc space at C6-C7 with associated mild anterolisthesis. Skull base and vertebrae: Multiple cervical spine fractures. Nondisplaced fracture involving the C5 spinous process. Bilateral laminal fractures at C6. Right laminal fracture C6 is displaced. Mildly distracted fracture involving the C6 spinous process. Right C7 facet intra-articular fracture. Displaced fracture of the C7 spinous process. Displaced fracture of the T1 spinous process. Anterior wedge compression fracture at C2. Soft tissues and spinal canal: Soft tissue swelling centered around C6-C7 in the cervicothoracic junction. Disc levels: Marked disc widening at C6-C7 suggesting disruption of the anterior and posterior longitudinal ligaments at this level. Upper chest: Extensive calcifications and scarring at the lung apices. There is concern for hematoma or abnormal soft tissue in the upper mediastinum. Small amount of right pleural fluid or thickening. Other: None IMPRESSION: 1. Unstable cervical spine injury with multiple fractures. Evidence for disruption of the anterior and posterior longitudinal ligaments at C6-C7. There is marked widening of the C6-C7 disc space with anterolisthesis. 2. Bilateral laminal fractures at C6. Right intra-articular facet fracture at C7. Spinous process fractures at C5, C6, C7 and T1. 3. Anterior wedge compression fracture at T2. 4. Cannot exclude upper  mediastinal hematoma. Right pleural fluid or pleural thickening. 5. Negative for intracranial hemorrhage. 6. Indeterminate low-density in the left cerebellar hemisphere. This area is difficult to evaluate due to artifact. Age-indeterminate infarct cannot be excluded. 7. Large scalp hematoma along the posterior vertex. No underlying fracture. These results were called by telephone at the time of interpretation on 02/05/2019 at 12:22 pm to Dr. Azalia BilisKEVIN CAMPOS , who verbally acknowledged these results. Electronically Signed   By:  Richarda Overlie M.D.   On: 02/05/2019 12:29   Ct Thoracic Spine Wo Contrast  Result Date: 02/05/2019 CLINICAL DATA:  Status post fall today. Thoracic spine pain. Initial encounter. EXAM: CT THORACIC SPINE WITHOUT CONTRAST TECHNIQUE: Multidetector CT images of the thoracic were obtained using the standard protocol without intravenous contrast. COMPARISON:  PA and lateral chest 11/19/2018. FINDINGS: Alignment: Exaggeration of the normal thoracic kyphosis. No listhesis in the thoracic spine. Malalignment in the cervical spine is noted. Please see report of dedicated cervical spine CT scan. Vertebrae: There is a fracture of the spinous process of T1. The patient also has a mild biconcave compression fracture of T2 with vertebral body height loss approximately 60%. Fracture margins are sharp consistent with acute injury. No involvement of the posterior elements is identified. Mild superior endplate compression fracture of T3 with vertebral body height loss of approximately 10% is also identified. No involvement of the posterior elements. No other fracture is identified. Paraspinal and other soft tissues: Small left and small to moderate right pleural effusions are identified. There is cardiomegaly. Extensive atherosclerosis is present. Disc levels: Scattered loss of disc space height is noted. IMPRESSION: Acute biconcave compression fracture of T2 with vertebral body height loss of up to 60%. Mild  superior endplate compression fracture of T3 with vertebral body height loss of approximately 10%. No bony retropulsion or involvement of the posterior elements at either level. Fracture of the spinous process of T1. Cervical spine fractures are incompletely imaged. Please see report of dedicated cervical spine CT this same day. Osteopenia. Cardiomegaly. Atherosclerosis. Small left and small to moderate right pleural effusions. Electronically Signed   By: Drusilla Kanner M.D.   On: 02/05/2019 11:52   Dg Chest Portable 1 View  Result Date: 02/05/2019 CLINICAL DATA:  Status post fall. Hypoxia. EXAM: PORTABLE CHEST 1 VIEW COMPARISON:  11/19/2018 FINDINGS: Enlarged cardiac silhouette. Calcific atherosclerotic disease of the aorta. There is no evidence of pneumothorax. Moderate right and small left pleural effusion. Pulmonary vascular congestion. Osseous structures are without acute abnormality. Soft tissues are grossly normal. IMPRESSION: 1. Enlarged cardiac silhouette with pulmonary vascular congestion. 2. Moderate right and small left pleural effusions. Electronically Signed   By: Ted Mcalpine M.D.   On: 02/05/2019 13:55   None  Pending Labs [employer] (From admission, onward)    Start     Ordered   Signed and Armed forces training and education officer morning,   R     Signed and Held   Signed and Held  CBC  Tomorrow morning,   R     Signed and Held          Vitals/Pain Today's Vitals   02/05/19 1101 02/05/19 1149 02/05/19 1303 02/05/19 1404  BP: 133/68  126/78   Pulse: 97  97   Resp: 16  16   Temp: 98.8 F (37.1 C)  98.6 F (37 C)   TempSrc: Oral  Oral   SpO2: (!) 85%  93%   Weight:      Height:      PainSc:  6   3     Isolation Precautions No active isolations  Medications Medications  morphine 2 MG/ML injection 2 mg (2 mg Intravenous Given 02/05/19 1610)  sodium chloride (PF) 0.9 % injection (has no administration in time range)  morphine 4 MG/ML injection 4 mg (4  mg Intravenous Given 02/05/19 1318)  iohexol (OMNIPAQUE) 300 MG/ML solution 75 mL (75 mLs Intravenous Contrast Given 02/05/19 1425)  Mobility non-ambulatory

## 2019-02-05 NOTE — H&P (Signed)
[facility] History and Physical  Brittany Schultz ZOX:[id number] DOB: Oct 13, 1922 DOA: 02/05/2019   PCP: Angela Coxasanayaka, Gayani Y, MD  Specialists: None  Chief Complaint: Fall at assisted living facility  HPI: Brittany Schultz is a 85 y.o. adult with past medical history of essential hypertension, dyslipidemia, atrial fibrillation no longer on anticoagulation, history of venous insufficiency, lower extremity wounds, chronic diastolic CHF, previous documented history of hypothyroidism but not noted to be on levothyroxine currently, hospitalized in December for suspected GI bleed who lives in an assisted living facility.  History is obtained mainly from the ED provider notes as well as patient's granddaughter who is her healthcare power of attorney.  Patient currently appears to be sedated due to pain medications.  Patient apparently had a mechanical fall at the assisted living facility.  She fell backwards and hit her head.  It was verified that the patient currently is not on anticoagulation.  She used to be on rivaroxaban previously.  Patient was brought into the hospital.  Currently patient is in a neck collar.  She is partially sedated due to narcotic pain medications.  Denies any pain currently but occasionally moans and groans.  Evaluation in the emergency department revealed an unstable cervical spine fracture.  She was also found to have thoracic compression fractures.  Evaluation also raised concern for thoracic masses possibly esophageal in origin.  Neurosurgery was consulted.  Due to her comorbidities and advanced age she is not thought to be a candidate for surgical intervention.  Patient subsequently was noted to be hypoxic requiring oxygen.  She does have bilateral pleural effusion on imaging studies.  Home Medications: Prior to Admission medications   Medication Sig Start Date End Date Taking? Authorizing Provider  acetaminophen (TYLENOL) 325 MG tablet Take 650 mg by mouth 3 (three)  times daily.  03/17/14  Yes Krell, Claudette T, NP  albuterol (PROVENTIL) (2.5 MG/3ML) 0.083% nebulizer solution Take 3 mLs (2.5 mg total) by nebulization every 6 (six) hours as needed for wheezing or shortness of breath. 11/04/18  Yes Sheikh, Omair Latif, DO  carvedilol (COREG) 6.25 MG tablet Take 6.25 mg by mouth daily.    Yes [provider]  furosemide (LASIX) 40 MG tablet Take 1 tablet (40 mg total) by mouth daily. 11/02/14  Yes Leroy SeaSingh, Prashant K, MD  hydrALAZINE (APRESOLINE) 25 MG tablet Take 1 tablet (25 mg total) by mouth every 8 (eight) hours. Patient taking differently: Take 25 mg by mouth 3 (three) times daily.  11/02/14  Yes Leroy SeaSingh, Prashant K, MD  hydrOXYzine (ATARAX/VISTARIL) 25 MG tablet Take 1 tablet (25 mg total) by mouth every 8 (eight) hours as needed. Patient taking differently: Take 25 mg by mouth every 8 (eight) hours as needed for itching.  04/20/14  Yes Krell, Claudette T, NP  LORazepam (ATIVAN) 0.5 MG tablet Take 0.25 mg by mouth every 12 (twelve) hours as needed for anxiety.   Yes [provider]  mineral oil external liquid Place 2 drops into both ears once a week.   Yes [provider]  Multiple Vitamin (MULTIVITAMIN) LIQD Take 15 mLs by mouth daily. 11/21/18  Yes Sheikh, Omair Latif, DO  Nutritional Supplements (JUICE PLUS FIBRE PO) Take 1 capsule by mouth 3 (three) times daily. Take one capsule of each three times a day [employer]   Yes [provider]  pantoprazole (PROTONIX) 40 MG tablet Take 1 tablet (40 mg total) by mouth daily. 11/21/18  Yes Marguerita MerlesSheikh, Omair [place], DO  potassium chloride SA (K-DUR,KLOR-CON) 20 MEQ tablet Take 20 mEq by mouth daily. 07/01/18  Yes [provider]  sertraline (ZOLOFT) 100 MG tablet Take 100 mg by mouth daily.   Yes [provider]  spironolactone (ALDACTONE) 25 MG tablet Take 1 tablet (25 mg total) by mouth daily. 04/20/14  Yes Krell, Claudette T, NP  thiamine  100 MG tablet Take 1 tablet (100 mg total) by mouth daily. 11/21/18  Yes Sheikh, Omair Latif, DO  traMADol (ULTRAM) 50 MG tablet Take 1 tablet (50 mg total) by mouth as directed. Take 1 tablet BID & Take 1 tablet daily PRN for Pain. Patient taking differently: Take 50 mg by mouth 2 (two) times daily. Can also take 1 extra tablet daily PRN break through pain 11/21/18  Yes Sheikh, Omair Latif, DO  VITAMIN D, ERGOCALCIFEROL, PO Take 1,000 Units by mouth daily.    Yes [provider]  Zinc Oxide (DESITIN) 13 % CREA Apply 1 application topically 2 (two) times daily.   Yes [provider]    Allergies:  Allergies  Allergen Reactions  . Sulfa Antibiotics Rash    Rash w/ sulfonomides furosemide, meloxicam  . Codeine     unknown  . Penicillins     unknown  . Sulfonylureas     unknown  . Clindamycin/Lincomycin Itching and Rash    Past Medical History: Past Medical History:  Diagnosis Date  . A-fib [facility])    hospitalized 04/2009  . CHF (congestive heart failure) (HCC)   . Deaf   . Dermatitis 01/2014  . History of shingles   . Hx of colonic polyps    Adenomatous polyps  . Hyperlipidemia   . Hypertension   . Lack of coordination   . Long term (current) use of anticoagulants    warfarin for stroke risk reduction re: AF  . Muscle weakness (generalized)   . Open wound of left lower leg 02/2104  . Osteoarthritis of left hip   . Other malaise and fatigue   . Peripheral vascular disease, unspecified (HCC)   . PVD (peripheral vascular disease) (HCC)   . Shortness of breath 03/23/14  . Thrombocytopenia (HCC) April 2015   144,000  . Unspecified hypothyroidism   . Unstable gait   . Varicose veins of lower extremities with ulcer (HCC)   . Venous insufficiency of both lower extremities    vascular evaluation 10/2013, 01/2014 Dr.Powell  . Venous stasis ulcer of left lower extremity (HCC)   . Vitamin D deficiency     Past Surgical History:  Procedure Laterality Date  . CATARACT  EXTRACTION W/ INTRAOCULAR LENS  IMPLANT, BILATERAL Bilateral     Social History: Lives in an assisted living facility.  No current history of smoking or alcohol use.   Family History:  Family History  Problem Relation Age of Onset  . Hypertension Mother   . Stroke Mother   . Heart disease Mother   . Hypertension Father   . Hypertension Brother      Review of Systems -unable to do due to sedation  Physical Examination  Vitals:   02/05/19 0844 02/05/19 0854 02/05/19 1101 02/05/19 1303  BP:   133/68 126/78  Pulse:   97 97  Resp:   16 16  Temp:   98.8 F (37.1 C) 98.6 F (37 C)  TempSrc:   Oral Oral  SpO2:  95% (!) 85% 93%  Weight: 70.8 kg     Height:  (1.676 m)  BP 126/78 (BP Location: Right Arm)   Pulse 97   Temp 98.6 F (37 C) (Oral)   Resp 16   Ht  (1.676 m)   Wt 70.8 kg   SpO2 93%   BMI 25.18 kg/m   General appearance: Somnolent but easily arousable. Head: Normocephalic, without obvious abnormality, atraumatic Eyes: conjunctivae/corneas clear. PERRL, EOM's intact.  Neck: Cervical neck collar noted Resp: Diminished air entry at the bases Cardio: regular rate and rhythm, S1, S2 normal, no murmur, click, rub or gallop GI: soft, non-tender; bowel sounds normal; no masses,  no organomegaly Extremities: Lower extremities covered with dressing Pulses: 2+ and symmetric Skin: Skin color, texture, turgor normal. No rashes or lesions Lymph nodes: Cervical, supraclavicular, and axillary nodes normal. Neurologic: Somnolent but arousable.  Moving her legs.  No facial asymmetry.   Labs on Admission: I have personally reviewed following labs and imaging studies  CBC: Recent Labs  Lab 02/05/19 1301  WBC 17.4*  HGB 11.9*  HCT 38.0  MCV 96.7  PLT 208   Basic Metabolic Panel: Recent Labs  Lab 02/05/19 1301  NA 136  K 3.9  CL 101  CO2 24  GLUCOSE 128*  BUN 26*  CREATININE 0.94  CALCIUM 8.2*   GFR: Estimated Creatinine Clearance (by C-G  formula based on SCr of 0.94 mg/dL) Female: 16.1 mL/min Female: 43.4 mL/min Liver Function Tests: Recent Labs  Lab 02/05/19 1301  AST 33  ALT 26  ALKPHOS 87  BILITOT 0.8  PROT 7.3  ALBUMIN 3.3*    Radiological Exams on Admission: Ct Head Wo Contrast  Result Date: 02/05/2019 CLINICAL DATA:  Larey Seat backwards. Witnessed fall. C-spine trauma. High clinical risk. EXAM: CT HEAD WITHOUT CONTRAST CT CERVICAL SPINE WITHOUT CONTRAST TECHNIQUE: Multidetector CT imaging of the head and cervical spine was performed following the standard protocol without intravenous contrast. Multiplanar CT image reconstructions of the cervical spine were also generated. COMPARISON:  None. FINDINGS: CT HEAD FINDINGS Brain: Mild cerebral atrophy. Low-density in the left cerebral hemisphere is nonspecific and could be related to artifact. Difficult to exclude age-indeterminate infarct in this area. Negative for acute hemorrhage, mass lesion or midline shift or hydrocephalus. Subtle low-density in the white matter is suggestive for chronic changes. Vascular: No hyperdense vessel or unexpected calcification. Skull: No acute abnormality. Sinuses/Orbits: Minimal mucosal thickening in the paranasal sinuses. Other: Posterior scalp hematoma best seen on sequence 4, image 31. This scalp hematoma measures up to 3.6 cm based on the sagittal reformats, sequence 7, image 39. CT CERVICAL SPINE FINDINGS Alignment: Marked widening of the disc space at C6-C7 with associated mild anterolisthesis. Skull base and vertebrae: Multiple cervical spine fractures. Nondisplaced fracture involving the C5 spinous process. Bilateral laminal fractures at C6. Right laminal fracture C6 is displaced. Mildly distracted fracture involving the C6 spinous process. Right C7 facet intra-articular fracture. Displaced fracture of the C7 spinous process. Displaced fracture of the T1 spinous process. Anterior wedge compression fracture at C2. Soft tissues and spinal canal:  Soft tissue swelling centered around C6-C7 in the cervicothoracic junction. Disc levels: Marked disc widening at C6-C7 suggesting disruption of the anterior and posterior longitudinal ligaments at this level. Upper chest: Extensive calcifications and scarring at the lung apices. There is concern for hematoma or abnormal soft tissue in the upper mediastinum. Small amount of right pleural fluid or thickening. Other: None IMPRESSION: 1. Unstable cervical spine injury with multiple fractures. Evidence for disruption of the anterior and posterior longitudinal ligaments at C6-C7. There is marked widening  of the C6-C7 disc space with anterolisthesis. 2. Bilateral laminal fractures at C6. Right intra-articular facet fracture at C7. Spinous process fractures at C5, C6, C7 and T1. 3. Anterior wedge compression fracture at T2. 4. Cannot exclude upper mediastinal hematoma. Right pleural fluid or pleural thickening. 5. Negative for intracranial hemorrhage. 6. Indeterminate low-density in the left cerebellar hemisphere. This area is difficult to evaluate due to artifact. Age-indeterminate infarct cannot be excluded. 7. Large scalp hematoma along the posterior vertex. No underlying fracture. These results were called by telephone at the time of interpretation on 02/05/2019 at 12:22 pm to Dr. Azalia Bilis , who verbally acknowledged these results. Electronically Signed   By: Richarda Overlie M.D.   On: 02/05/2019 12:29   Ct Chest W Contrast  Result Date: 02/05/2019 CLINICAL DATA:  Shortness of breath and chest pain. Status post witnessed fall. EXAM: CT CHEST WITH CONTRAST TECHNIQUE: Multidetector CT imaging of the chest was performed during intravenous contrast administration. CONTRAST:  75mL OMNIPAQUE IOHEXOL 300 MG/ML  SOLN COMPARISON:  None. FINDINGS: Cardiovascular: Heart is enlarged. Atherosclerotic calcification is noted in the wall of the thoracic aorta. Coronary artery calcification is evident. No substantial pericardial  effusion. Mediastinum/Nodes: Large soft tissue mass is identified in the central upper mediastinum, apparently incorporating the proximal esophagus. This lesion measures 3.7 x 3.8 x 4.1 cm. Possible lymph node or second area of esophageal enlargement posterior to the carina, measuring 2.8 x 3.3 cm. There is no hilar lymphadenopathy. There is no axillary lymphadenopathy. Lungs/Pleura: The central tracheobronchial airways are patent. Interlobular septal thickening noted in the lungs bilaterally with patchy areas of ground-glass attenuation. Areas of mosaic attenuation in the lungs bilaterally are nonspecific but likely reflect small airways disease. Loculated fluid noted in the right major fissure the and in the posterior hemithorax. Fine detail of lung parenchyma in the bases is obscured by breathing motion. Small free-flowing left pleural effusion evident. Upper Abdomen: Cortical scarring noted left kidney. Otherwise unremarkable Musculoskeletal: No worrisome lytic or sclerotic osseous abnormality. See thoracic spine CT performed today for definitive characterization of thoracic spinal anatomy. IMPRESSION: 1. Soft tissue masses identified in the mediastinum. The more cranial of the 2 incorporates the proximal esophagus and esophageal tissue can not be discriminated as separate from this soft tissue. This may be a proximal esophageal mass or bulky mediastinal lymphadenopathy markedly compressing the esophagus. A second soft tissue mass is identified posterior to the carina, again without ability to discriminate the esophagus as a separate structure. Barium swallow or upper endoscopy may prove helpful to further evaluate. 2. Moderate loculated right pleural effusion including fluid loculated in the right major fissure. Small free-flowing left pleural effusion evident. 3. Collapse/consolidation in the right middle and lower lobes. 4.  Aortic Atherosclerois (ICD10-170.0) 5. See thoracic spine CT report from today for  definitive characterization of thoracic spinal anatomy. Electronically Signed   By: Kennith Center M.D.   On: 02/05/2019 14:54   Ct Cervical Spine Wo Contrast  Result Date: 02/05/2019 CLINICAL DATA:  Larey Seat backwards. Witnessed fall. C-spine trauma. High clinical risk. EXAM: CT HEAD WITHOUT CONTRAST CT CERVICAL SPINE WITHOUT CONTRAST TECHNIQUE: Multidetector CT imaging of the head and cervical spine was performed following the standard protocol without intravenous contrast. Multiplanar CT image reconstructions of the cervical spine were also generated. COMPARISON:  None. FINDINGS: CT HEAD FINDINGS Brain: Mild cerebral atrophy. Low-density in the left cerebral hemisphere is nonspecific and could be related to artifact. Difficult to exclude age-indeterminate infarct in this area. Negative for  acute hemorrhage, mass lesion or midline shift or hydrocephalus. Subtle low-density in the white matter is suggestive for chronic changes. Vascular: No hyperdense vessel or unexpected calcification. Skull: No acute abnormality. Sinuses/Orbits: Minimal mucosal thickening in the paranasal sinuses. Other: Posterior scalp hematoma best seen on sequence 4, image 31. This scalp hematoma measures up to 3.6 cm based on the sagittal reformats, sequence 7, image 39. CT CERVICAL SPINE FINDINGS Alignment: Marked widening of the disc space at C6-C7 with associated mild anterolisthesis. Skull base and vertebrae: Multiple cervical spine fractures. Nondisplaced fracture involving the C5 spinous process. Bilateral laminal fractures at C6. Right laminal fracture C6 is displaced. Mildly distracted fracture involving the C6 spinous process. Right C7 facet intra-articular fracture. Displaced fracture of the C7 spinous process. Displaced fracture of the T1 spinous process. Anterior wedge compression fracture at C2. Soft tissues and spinal canal: Soft tissue swelling centered around C6-C7 in the cervicothoracic junction. Disc levels: Marked disc  widening at C6-C7 suggesting disruption of the anterior and posterior longitudinal ligaments at this level. Upper chest: Extensive calcifications and scarring at the lung apices. There is concern for hematoma or abnormal soft tissue in the upper mediastinum. Small amount of right pleural fluid or thickening. Other: None IMPRESSION: 1. Unstable cervical spine injury with multiple fractures. Evidence for disruption of the anterior and posterior longitudinal ligaments at C6-C7. There is marked widening of the C6-C7 disc space with anterolisthesis. 2. Bilateral laminal fractures at C6. Right intra-articular facet fracture at C7. Spinous process fractures at C5, C6, C7 and T1. 3. Anterior wedge compression fracture at T2. 4. Cannot exclude upper mediastinal hematoma. Right pleural fluid or pleural thickening. 5. Negative for intracranial hemorrhage. 6. Indeterminate low-density in the left cerebellar hemisphere. This area is difficult to evaluate due to artifact. Age-indeterminate infarct cannot be excluded. 7. Large scalp hematoma along the posterior vertex. No underlying fracture. These results were called by telephone at the time of interpretation on 02/05/2019 at 12:22 pm to Dr. Azalia Bilis , who verbally acknowledged these results. Electronically Signed   By: Richarda Overlie M.D.   On: 02/05/2019 12:29   Ct Thoracic Spine Wo Contrast  Result Date: 02/05/2019 CLINICAL DATA:  Status post fall today. Thoracic spine pain. Initial encounter. EXAM: CT THORACIC SPINE WITHOUT CONTRAST TECHNIQUE: Multidetector CT images of the thoracic were obtained using the standard protocol without intravenous contrast. COMPARISON:  PA and lateral chest 11/19/2018. FINDINGS: Alignment: Exaggeration of the normal thoracic kyphosis. No listhesis in the thoracic spine. Malalignment in the cervical spine is noted. Please see report of dedicated cervical spine CT scan. Vertebrae: There is a fracture of the spinous process of T1. The patient  also has a mild biconcave compression fracture of T2 with vertebral body height loss approximately 60%. Fracture margins are sharp consistent with acute injury. No involvement of the posterior elements is identified. Mild superior endplate compression fracture of T3 with vertebral body height loss of approximately 10% is also identified. No involvement of the posterior elements. No other fracture is identified. Paraspinal and other soft tissues: Small left and small to moderate right pleural effusions are identified. There is cardiomegaly. Extensive atherosclerosis is present. Disc levels: Scattered loss of disc space height is noted. IMPRESSION: Acute biconcave compression fracture of T2 with vertebral body height loss of up to 60%. Mild superior endplate compression fracture of T3 with vertebral body height loss of approximately 10%. No bony retropulsion or involvement of the posterior elements at either level. Fracture of the spinous process of  T1. Cervical spine fractures are incompletely imaged. Please see report of dedicated cervical spine CT this same day. Osteopenia. Cardiomegaly. Atherosclerosis. Small left and small to moderate right pleural effusions. Electronically Signed   By: Drusilla Kannerhomas  Dalessio M.D.   On: 02/05/2019 11:52   Dg Chest Portable 1 View  Result Date: 02/05/2019 CLINICAL DATA:  Status post fall. Hypoxia. EXAM: PORTABLE CHEST 1 VIEW COMPARISON:  11/19/2018 FINDINGS: Enlarged cardiac silhouette. Calcific atherosclerotic disease of the aorta. There is no evidence of pneumothorax. Moderate right and small left pleural effusion. Pulmonary vascular congestion. Osseous structures are without acute abnormality. Soft tissues are grossly normal. IMPRESSION: 1. Enlarged cardiac silhouette with pulmonary vascular congestion. 2. Moderate right and small left pleural effusions. Electronically Signed   By: Ted Mcalpineobrinka  Dimitrova M.D.   On: 02/05/2019 13:55      Problem List  Principal Problem:    Unstable cervical spine Active Problems:   A-fib (HCC)   Bilateral pleural effusion   Mass in chest   Thoracic compression fracture [facility]HCC)   Assessment: This is a 85 year old Caucasian female with past medical history as stated earlier who comes after mechanical fall sustained at her assisted living facility.  She is noted to have an unstable cervical spine fracture, thoracic compression fracture and found to have masses in the thorax possibly esophageal in origin.  Plan:  1. Unstable cervical spine fractures: Patient is currently on neck collar.  Case was discussed with neurosurgery who has evaluated patient's imaging studies.  This fracture would typically require surgical intervention. However due to patient's advanced age and comorbidities she is not thought to be a good surgical candidate.  This was discussed with patient's granddaughter who is her power of attorney and she agrees.  Continue neck collar.  Pain control.  2.  Acute respiratory failure with hypoxia: Patient was noted to be hypoxic in the emergency department.  She is noted to have bilateral pleural effusion which could be contributing.  Pain medications could also be contributing.  Continue to monitor for now.  Continue oxygen.  3.  Thoracic compression fractures: Most likely result of her fall.  Again not likely to be a candidate for any intervention.  PT and OT evaluation.  4.  Mediastinal masses possibly esophageal in origin: 2 masses noted on CT scan of the chest.  These findings were discussed with patient's healthcare power of attorney who is her granddaughter.  It would be very difficult to do any kind of endoscopy in the patient with unstable cervical spine.  This was discussed with the patient's granddaughter.  She does not want any further evaluation for this.  Her main goal is to keep her grandmother comfortable.  5.  Bilateral pleural effusions: Possible reason for hypoxia although most likely hypoxia is due to  narcotics.  Chest x rays done in November and December of 2019 also showed these pleural effusions. So they appear to be chronic but may have increased in size.  Consider thoracentesis only if they cause significant symptoms.  6.  History of atrial fibrillation, chronic: Currently not on anticoagulation.  Anticoagulation was discontinued due to GI bleed in December.  7.  History of hypothyroidism: Not noted to be on any thyroid medication.  We will check a TSH.  8. Leukocytosis and normocytic anemia: Elevated WBC likely reactive.  She is afebrile.  Recheck counts tomorrow.  9. Goals of care: Patient is DNR based on my conversation with patient's granddaughter and as per patient's previously known wishes.  In view  of all of the above issues we will consult palliative medicine for goals of care.  It would also be important to see how the patient does over the next 24 to 48 hours.  If she does not progress or continues to decline then we may have to transition her to hospice/comfort care.  Will request speech therapy to evaluate her swallow function tomorrow before we will initiate a diet.  Patient also noted to have multiple other nonspecific findings on her imaging studies.  In view of all of the above issues will not pursue further evaluation at this time.    DVT Prophylaxis: SCDs Code Status: DNR Family Communication: Discussed with the patient's granddaughter who is her healthcare power of attorney Disposition: Unclear.  PT and OT to see.  Patient may need higher level of care. Consults called: Neurosurgery.  Palliative medicine Admission Status: Inpatient  Severity of Illness: The appropriate patient status for this patient is INPATIENT. Inpatient status is judged to be reasonable and necessary in order to provide the required intensity of service to ensure the patient's safety. The patient's presenting symptoms, physical exam findings, and initial radiographic and laboratory data in the  context of their chronic comorbidities is felt to place them at high risk for further clinical deterioration. Furthermore, it is not anticipated that the patient will be medically stable for discharge from the hospital within 2 midnights of admission. The following factors support the patient status of inpatient.   " The patient's presenting symptoms include neck pain. " The worrisome physical exam findings include hypoxia. " The initial radiographic and laboratory data are worrisome because of unstable cervical spine. " The chronic co-morbidities include atrial fibrillation.   * I certify that at the point of admission it is my clinical judgment that the patient will require inpatient hospital care spanning beyond 2 midnights from the point of admission due to high intensity of service, high risk for further deterioration and high frequency of surveillance required.*  Further management decisions will depend on results of further testing and patient's response to treatment.  Beth Spackman [facility]  [employer] on [email or web address]  02/05/2019, 4:43 PM

## 2019-02-05 NOTE — Plan of Care (Signed)
No changes needed to care plans.  

## 2019-02-05 NOTE — ED Notes (Signed)
Patient lying in bed with eyes closed. No pain at present.  Patient's hearing aides are in the bed with her. Placed in a pink denture box, patient sticker applied. Box placed inside of a patient belonging bag and patient sticker placed on bag as well.

## 2019-02-05 NOTE — Progress Notes (Signed)
Pt arrived to floor in overall stable condition moaning from pain with aspen collar on. Pt vs stable. rn assessed skin and applied new dressing to bilat lower legs. Pt urine with strong odor in brief on arrival to floor. Pt was arousable but unable to to respond vocally at times with family and staff. Pt family updated on plan of care at this point. Pt great nephew did wonder if a halo device would be better overall. Pt overall pale, weak, and tired. No apparent signs of distress noted.

## 2019-02-05 NOTE — ED Triage Notes (Signed)
Per EMS, Pt from assisted living facility , had witnessed fall, fell backwards, hit head, no LOC .Marland Kitchen pt c/o neck pain and pain to back of head. Not currently on anticoags. Hx of arthritis, pt has baseline neck/back pain complaints.

## 2019-02-06 DIAGNOSIS — Z515 Encounter for palliative care: Secondary | ICD-10-CM

## 2019-02-06 DIAGNOSIS — I482 Chronic atrial fibrillation, unspecified: Secondary | ICD-10-CM

## 2019-02-06 DIAGNOSIS — L8990 Pressure ulcer of unspecified site, unspecified stage: Secondary | ICD-10-CM

## 2019-02-06 DIAGNOSIS — S129XXA Fracture of neck, unspecified, initial encounter: Secondary | ICD-10-CM | POA: Diagnosis present

## 2019-02-06 DIAGNOSIS — J9859 Other diseases of mediastinum, not elsewhere classified: Secondary | ICD-10-CM

## 2019-02-06 DIAGNOSIS — R222 Localized swelling, mass and lump, trunk: Secondary | ICD-10-CM

## 2019-02-06 DIAGNOSIS — S22020A Wedge compression fracture of second thoracic vertebra, initial encounter for closed fracture: Secondary | ICD-10-CM

## 2019-02-06 DIAGNOSIS — Z7189 Other specified counseling: Secondary | ICD-10-CM

## 2019-02-06 LAB — CBC
HCT: 38.3 % (ref 36.0–46.0)
Hemoglobin: 11.5 g/dL — ABNORMAL LOW (ref 12.0–15.0)
MCH: 30.2 pg (ref 26.0–34.0)
MCHC: 30 g/dL (ref 30.0–36.0)
MCV: 100.5 fL — ABNORMAL HIGH (ref 80.0–100.0)
NRBC: 0 % (ref 0.0–0.2)
Platelets: 168 10*3/uL (ref 150–400)
RBC: 3.81 MIL/uL — ABNORMAL LOW (ref 3.87–5.11)
RDW: 14.4 % (ref 11.5–15.5)
WBC: 11.8 10*3/uL — ABNORMAL HIGH (ref 4.0–10.5)

## 2019-02-06 LAB — BASIC METABOLIC PANEL
Anion gap: 9 (ref 5–15)
BUN: 26 mg/dL — ABNORMAL HIGH (ref 8–23)
CO2: 24 mmol/L (ref 22–32)
Calcium: 8.1 mg/dL — ABNORMAL LOW (ref 8.9–10.3)
Chloride: 102 mmol/L (ref 98–111)
Creatinine, Ser: 0.94 mg/dL (ref 0.44–1.00)
GFR calc non Af Amer: 52 mL/min — ABNORMAL LOW (ref >60)
Glucose, Bld: 140 mg/dL — ABNORMAL HIGH (ref 70–99)
Potassium: 4.5 mmol/L (ref 3.5–5.1)
Sodium: 135 mmol/L (ref 135–145)

## 2019-02-06 LAB — TSH: TSH: 0.821 u[IU]/mL (ref 0.350–4.500)

## 2019-02-06 MED ORDER — STARCH (THICKENING) PO POWD
ORAL | Status: DC | PRN
  Filled 2019-02-06: qty 227

## 2019-02-06 MED ORDER — OXYCODONE HCL 5 MG/5ML PO SOLN: 5.0000 mg | ORAL | Status: DC | PRN

## 2019-02-06 NOTE — Evaluation (Signed)
Occupational Therapy Evaluation Patient Details Name: Brittany Schultz MRN: [id number] DOB: August 24, 1922 Today's Date: 02/06/2019    History of Present Illness 85 y.o. adult with past medical history of essential hypertension, dyslipidemia, atrial fibrillation no longer on anticoagulation, history of venous insufficiency, lower extremity wounds, chronic diastolic CHF, previous documented history of hypothyroidism but not noted to be on levothyroxine currently, hospitalized in December for suspected GI bleed who lives in an assisted living facility. Patient apparently had a mechanical fall at the assisted living facility.  She fell backwards and hit her head. Evaluation in the emergency department revealed an unstable cervical spine fracture.  She was also found to have thoracic compression fractures.  Evaluation also raised concern for thoracic masses possibly esophageal in origin.  Neurosurgery was consulted.  Due to her comorbidities and advanced age she is not thought to be a candidate for surgical intervention.  Patient subsequently was noted to be hypoxic requiring oxygen.  She does have bilateral pleural effusion on imaging studies.   Clinical Impression   Pt admitted with the above diagnoses and presents with below problem list. Pt will benefit from continued acute OT to address the below listed deficits and maximize independence with basic ADLs prior to d/c to venue below. PTA pt was living in SNF, unclear what baseline was with ADLs but appears she was using a walker. Pt lethargic, but arousable, and limited by pain this session. She attempted to assist with bed mobility but ultimately was total +2 A. Pt sat EOB 3-4 minutes with mod to max A, posterior lean noted.      Follow Up Recommendations  SNF    Equipment Recommendations  Other (comment)(defer to next venue)    Recommendations for Other Services Other (comment)(palliative consult)     Precautions / Restrictions  Precautions Precautions: Fall;Cervical Precaution Comments: unstable cervical fractures Restrictions Weight Bearing Restrictions: (Aspen collar on neck) Other Position/Activity Restrictions: cervical and thoraciac fractures      Mobility Bed Mobility Overal bed mobility: Needs Assistance Bed Mobility: Rolling;Sidelying to Sit;Sit to Supine Rolling: Total assist;+2 for safety/equipment Sidelying to sit: Total assist;+2 for physical assistance;+2 for safety/equipment   Sit to supine: Total assist;+2 for physical assistance   General bed mobility comments: Pt iniatiating attempts to assist but ultimately total A for all aspects of bed mobility. Posterior lean. Close guarding/assist to neck and back provided. Hard collar on throughout session.  Transfers   Equipment used: Actuary(Aspen collar)             General transfer comment: Not attempted this session    Balance Overall balance assessment: Needs assistance Sitting-balance support: Bilateral upper extremity supported;Feet supported Sitting balance-Leahy Scale: Zero Sitting balance - Comments: poor to zero sitting balance. Posterior lean. Able at times to sit with mod A. Fatigued quickly.  Postural control: Posterior lean                                 ADL either performed or assessed with clinical judgement   ADL Overall ADL's : Needs assistance/impaired Eating/Feeding: Total assistance   Grooming: Total assistance   Upper Body Bathing: Total assistance   Lower Body Bathing: Total assistance   Upper Body Dressing : Total assistance   Lower Body Dressing: Total assistance                 General ADL Comments: Pt completed bed mobility and sat EOB with mod A for  sitting balance for a few minutes.     Vision         Perception     Praxis      Pertinent Vitals/Pain Pain Assessment: Faces Faces Pain Scale: Hurts whole lot Pain Location: unspecified but most likely site of fractures.  grimacing with bed mobility Pain Descriptors / Indicators: Grimacing;Moaning Pain Intervention(s): Limited activity within patient's tolerance;Monitored during session;Repositioned     Hand Dominance     Extremity/Trunk Assessment Upper Extremity Assessment Upper Extremity Assessment: Generalized weakness;Difficult to assess due to impaired cognition   Lower Extremity Assessment Lower Extremity Assessment: Defer to PT evaluation   Cervical / Trunk Assessment Cervical / Trunk Assessment: (multiple cervical and thoraciac fractures)   Communication Communication Communication: HOH   Cognition Arousal/Alertness: Lethargic(arousable, eyes most often closed) Behavior During Therapy: WFL for tasks assessed/performed Overall Cognitive Status: No family/caregiver present to determine baseline cognitive functioning                                     General Comments       Exercises     Shoulder Instructions      Home Living Family/patient expects to be discharged to:: Assisted living                                 Additional Comments: unknown, pt poor historian,      Prior Functioning/Environment          Comments: unsure, pt poor historian. per chart review pt used a walker.         OT Problem List: Decreased activity tolerance;Impaired balance (sitting and/or standing);Decreased strength;Decreased cognition;Decreased knowledge of use of DME or AE;Decreased knowledge of precautions;Decreased safety awareness;Pain      OT Treatment/Interventions: Self-care/ADL training;Therapeutic exercise;DME and/or AE instruction;Therapeutic activities;Patient/family education;Balance training    OT Goals(Current goals can be found in the care plan section) Acute Rehab OT Goals Patient Stated Goal: unable to state OT Goal Formulation: Patient unable to participate in goal setting Time For Goal Achievement: 02/21/19 Potential to Achieve Goals: Fair ADL  Goals Pt Will Perform Eating: with set-up;sitting Additional ADL Goal #1: Pt will complete bed mobility with mod +2 assist to prepare for EOB ADLs. Additional ADL Goal #2: Pt will sit EOB at min A level for 3 minutes to prepare for EOB ADLs and transfers.  OT Frequency: Min 2X/week   Barriers to D/C:            Co-evaluation PT/OT/SLP Co-Evaluation/Treatment: Yes Reason for Co-Treatment: Complexity of the patient's impairments (multi-system involvement);For patient/therapist safety;To address functional/ADL transfers   OT goals addressed during session: ADL's and self-care      AM-PAC OT "6 Clicks" Daily Activity     Outcome Measure Help from another person eating meals?: Total Help from another person taking care of personal grooming?: Total Help from another person toileting, which includes using toliet, bedpan, or urinal?: Total Help from another person bathing (including washing, rinsing, drying)?: Total Help from another person to put on and taking off regular upper body clothing?: Total Help from another person to put on and taking off regular lower body clothing?: Total 6 Click Score: 6   End of Session Equipment Utilized During Treatment: Cervical collar;Oxygen Nurse Communication: Mobility status  Activity Tolerance: Patient limited by pain;Patient limited by fatigue Patient left: in bed;with call bell/phone  within reach;with bed alarm set  OT Visit Diagnosis: Unsteadiness on feet (R26.81);Pain;Muscle weakness (generalized) (M62.81);History of falling (Z91.81);Other abnormalities of gait and mobility (R26.89);Other symptoms and signs involving cognitive function                Time: 1037-1100 OT Time Calculation (min): 23 min Charges:  OT General Charges $OT Visit: 1 Visit OT Evaluation $OT Eval Moderate Complexity: 1 Mod  Brittany KempKathryn Jumaane Schultz, OT Acute Rehabilitation Services Pager: [phone] Office: [phone]    Brittany GrammesMathews, Brittany Schultz 02/06/2019, 11:52 AM

## 2019-02-06 NOTE — Evaluation (Addendum)
Clinical/Bedside Swallow Evaluation Patient Details  Name: Brittany Schultz MRN: [id number] Date of Birth: 1922-05-04  Today's Date: 02/06/2019 Time: SLP Start Time (ACUTE ONLY): 1555 SLP Stop Time (ACUTE ONLY): 1627 SLP Time Calculation (min) (ACUTE ONLY): 32 min  Past Medical History:  Past Medical History:  Diagnosis Date  . A-fib [facility])    hospitalized 04/2009  . Anxiety   . CHF (congestive heart failure) (HCC)   . COPD (chronic obstructive pulmonary disease) (HCC)    marginal diagnosis  . Deaf   . Dementia (HCC)    not diagnosed but gets confused in hospital  . Depression   . Dermatitis 01/2014  . History of shingles   . Hx of colonic polyps    Adenomatous polyps  . Hyperlipidemia   . Hypertension   . Lack of coordination   . Long term (current) use of anticoagulants    warfarin for stroke risk reduction re: AF  . Muscle weakness (generalized)   . Open wound of left lower leg 02/2104  . Osteoarthritis of left hip    severe in left hip  . Other malaise and fatigue   . Peripheral vascular disease, unspecified (HCC)   . PVD (peripheral vascular disease) (HCC)   . Shortness of breath 03/23/14  . Thrombocytopenia (HCC) April 2015   144,000  . Unspecified hypothyroidism   . Unstable gait   . Varicose veins of lower extremities with ulcer (HCC)   . Venous insufficiency of both lower extremities    vascular evaluation 10/2013, 01/2014 Dr.Powell  . Venous stasis ulcer of left lower extremity (HCC)   . Vitamin D deficiency    Past Surgical History:  Past Surgical History:  Procedure Laterality Date  . CATARACT EXTRACTION W/ INTRAOCULAR LENS  IMPLANT, BILATERAL Bilateral   . CORONARY ANGIOPLASTY     heart cath  . EYE SURGERY     cataracts   HPI:  84 y.o. adult with past medical history of essential hypertension, dyslipidemia, atrial fibrillation no longer on anticoagulation, history of venous insufficiency, lower extremity wounds, chronic diastolic CHF, previous documented  history of hypothyroidism but not noted to be on levothyroxine currently, hospitalized in December for suspected GI bleed who lives in an assisted living facility.  History was obtained regarding swallowing from patient's granddaughter who is her healthcare power of attorney during admission on 02/05/19.  Patient apparently had a mechanical fall at the assisted living facility.  She fell backwards and hit her head. Currently patient is in a neck collar which, paired with pain from multiple fractures, affects positioning for swallowing; BSE ordered to assess swallowing function.  Assessment / Plan / Recommendation Clinical Impression   Pt exhibited pharyngoesophageal dysphagia characterized by multiple swallows and odynophagia with puree, thin, nectar and honey-thickened liquids.  Positioning was sub-optimal and pt's pain level impacting swallow function during BSE and places pt at a mild-moderate aspiration risk.  Pt's family was contacted (POA, granddaughter, Brittany Schultz) re: BSE results and granddaughter stated her grandmother did not have difficulty with swallowing prior to this fall.  CT chest indicated Soft tissue masses identified in the mediastinum. The more cranial of the 2 incorporates the proximal esophagus and esophageal tissue can not be discriminated as separate from this soft tissue. This may be a proximal esophageal mass or bulky mediastinal lymphadenopathy markedly compressing the esophagus. A second soft tissue mass is identified posterior to the carina, again without ability to discriminate the esophagus as a separate structure.   Due to multi-factorial components, it  is recommended pt be placed on a Dysphagia 1 (puree)/honey-thickened liquid diet for safety purposes with ice chips available prn.  ST will f/u for diet tolerance/education re: safety/swallowing precautions while in acute setting and potential need for objective assessment of swallowing when pt is able; thank you for this consult. SLP  Visit Diagnosis: Dysphagia, pharyngeal phase (R13.13);Dysphagia, pharyngoesophageal phase (R13.14)    Aspiration Risk  Mild aspiration risk;Moderate aspiration risk;Risk for inadequate nutrition/hydration    Diet Recommendation   Dysphagia 1/honey-thickened liquids  Medication Administration: Crushed with puree    Other  Recommendations Oral Care Recommendations: Oral care BID Other Recommendations: Order thickener from pharmacy;Other (Comment)(some foods allowed such as sherbet,ice cream, jello)   Follow up Recommendations Other (comment)(TBD)      Frequency and Duration min 2x/week  1 week       Prognosis Prognosis for Safe Diet Advancement: Fair Barriers to Reach Goals: Cognitive deficits;Severity of deficits      Swallow Study   General Date of Onset: 02/05/19 HPI: 85 y.o. adult with past medical history of essential hypertension, dyslipidemia, atrial fibrillation no longer on anticoagulation, history of venous insufficiency, lower extremity wounds, chronic diastolic CHF, previous documented history of hypothyroidism but not noted to be on levothyroxine currently, hospitalized in December for suspected GI bleed who lives in an assisted living facility.  History is obtained mainly from the ED provider notes as well as patient's granddaughter who is her healthcare power of attorney.  Patient currently appears to be sedated due to pain medications.  Patient apparently had a mechanical fall at the assisted living facility.  She fell backwards and hit her head.  It was verified that the patient currently is not on anticoagulation.  She used to be on rivaroxaban previously.  Patient was brought into the hospital.  Currently patient is in a neck collar Type of Study: Bedside Swallow Evaluation Previous Swallow Assessment: n/a Diet Prior to this Study: NPO Temperature Spikes Noted: No Respiratory Status: Nasal cannula History of Recent Intubation: No Behavior/Cognition:  Alert;Confused;Requires cueing;Distractible Oral Cavity Assessment: Dry Oral Care Completed by SLP: Yes Oral Cavity - Dentition: Dentures, top;Dentures, bottom Vision: Functional for self-feeding Self-Feeding Abilities: Needs assist Patient Positioning: Postural control interferes with function;Other (comment)(HOB as high as tolerated d/t pain) Baseline Vocal Quality: Low vocal intensity Volitional Cough: Cognitively unable to elicit Volitional Swallow: Unable to elicit    Oral/Motor/Sensory Function Overall Oral Motor/Sensory Function: Other (comment)(DTA)   Ice Chips Ice chips: Within functional limits Presentation: Spoon   Thin Liquid Thin Liquid: Impaired Presentation: Spoon;Straw Pharyngeal  Phase Impairments: Multiple swallows;Other (comments)(odynophagia)    Nectar Thick Nectar Thick Liquid: Impaired Presentation: Spoon Pharyngeal Phase Impairments: Multiple swallows;Other (comments)(odynophagia)   Honey Thick Honey Thick Liquid: Impaired Presentation: Spoon Pharyngeal Phase Impairments: Multiple swallows;Other (comments)(odynophagia)   Puree Puree: Impaired Presentation: Spoon Pharyngeal Phase Impairments: Multiple swallows;Other (comments)(odynophagia)   Solid     Solid: Not tested      Tressie Stalker, M.S., CCC-SLP 02/06/2019,5:12 PM

## 2019-02-06 NOTE — Progress Notes (Signed)
Speech therapy saw pt today for swallow eval. Pt did have difficulty swallowing and appeared painful with small swallows. New diet placed. Pt still continues to be weak and tired overall. Pt also is very painful with limited bed mobility as well.

## 2019-02-06 NOTE — Consult Note (Signed)
Consultation Note Date: 02/06/2019   Patient Name: Brittany Schultz  DOB: [birth year]  MRN: [id number]  Age / Sex: 85 y.o., adult  PCP: Merlene Laughter, MD Referring Physician: Mercy Riding, MD  Reason for Consultation: Establishing goals of care  HPI/Patient Profile: 85 y.o. adult  with past medical history of HTN, diastolic CHF, atrial fibrillation (no anticoagulation), h/o venous insufficiency, lower extremity wounds, recent admission for GIB admitted on 02/05/2019 with fall at ALF resulting in unstable fractures to cervical spine. Incidental finding of mediastinal masses along with bilateral pleural effusions and RML/RLL collapse/consolidation. Requiring neck collar. Not a surgical candidate. No further evaluation of mass desired by family.   Clinical Assessment and Goals of Care: I met today at [facility]. [place] bedside with her granddaughter and her husband (he is a physical therapist). They are frustrated with the fall and overwhelmed with the diagnosis of mass found on CT scan. She is confused and they say that she is typically confused when she is hospitalized and has sundowning. She has had a previous fall (no serious injury) and has been afraid of ambulating since. She also has kept her legs wrapped d/t swelling and has chronic wounds.   I swabbed her mouth to add moisture and she speaks even a little garbled from a few drops of mouth cleanser. She complains of pain with swallowing. She is confused but in good spirits. I do not see any thrush.   I introduced palliative care and they share that they are fans of [facility] ([facility] [employer]). They know that we are awaiting SLP evaluation and I fear that her swallow will not be good. They do acknowledge that they would like for her to return to ALF as she does best mentally there in familiar surroundings. They have to leave to get their children but I shared  my contact info with them and we will continue to discuss our options and how to move forward. I anticipate likely [facility] vs return to ALF with hospice in place but we will see how she progresses over the next 1-2 days.   I will continue to discuss with family GOC and options.   Primary Decision Maker HCPOA granddaughter [place]   - Await SLP recommendations - Will further discuss with HCPOA options and plans  Code Status/Advance Care Planning:  DNR   Symptom Management:   Pain: Roxicodone 5 mg every 4 hours prn mod pain. Dilaudid 0.5 mg IV every 2 hours prn severe pain. Recommend Tylenol 1000 mg TID if po approved by SLP.   Bowel Regimen: Senokot daily when po approved.   Palliative Prophylaxis:   Aspiration, Bowel Regimen, Delirium Protocol, Frequent Pain Assessment and Palliative Wound Care  Psycho-social/Spiritual:   Desire for further Chaplaincy support:yes  Additional Recommendations: Caregiving  Support/Resources and Education on Hospice  Prognosis:   TBD. Possibly weeks, possibly months. Will be able to better determine over next couple days.   Discharge Planning: To Be Determined  Primary Diagnoses: Present on Admission: . Unstable cervical spine . A-fib ([place]) . Mass in chest . Thoracic compression fracture ([place]) . Bilateral pleural effusion . Cervical spine fracture ([place])   I have reviewed the medical record, interviewed the patient and family, and examined the patient. The following aspects are pertinent.  Past Medical History:  Diagnosis Date  . A-fib [facility])    hospitalized 04/2009  . Anxiety   . CHF (congestive heart failure) ([place])   . COPD (chronic obstructive pulmonary disease) ([facility])    marginal diagnosis  . Deaf   . Dementia ([place])    not diagnosed but gets confused in hospital  . Depression   . Dermatitis 01/2014  . History of shingles   . Hx of colonic polyps    Adenomatous polyps  . Hyperlipidemia    . Hypertension   . Lack of coordination   . Long term (current) use of anticoagulants    warfarin for stroke risk reduction re: AF  . Muscle weakness (generalized)   . Open wound of left lower leg 02/2104  . Osteoarthritis of left hip    severe in left hip  . Other malaise and fatigue   . Peripheral vascular disease, unspecified ([place])   . PVD (peripheral vascular disease) ([place])   . Shortness of breath 03/23/14  . Thrombocytopenia ([place]) April 2015   144,000  . Unspecified hypothyroidism   . Unstable gait   . Varicose veins of lower extremities with ulcer ([place])   . Venous insufficiency of both lower extremities    vascular evaluation 10/2013, 01/2014 Dr.Powell  . Venous stasis ulcer of left lower extremity ([place])   . Vitamin D deficiency    Social History   Socioeconomic History  . Marital status: Widowed    Spouse name: Not on file  . Number of children: Not on file  . Years of education: Not on file  . Highest education level: Not on file  Occupational History  . Occupation: retired Personal assistant  Social Needs  . Financial resource strain: Not on file  . Food insecurity:    Worry: Not on file    Inability: Not on file  . Transportation needs:    Medical: Patient refused    Non-medical: Patient refused  Tobacco Use  . Smoking status: Never Smoker  . Smokeless tobacco: Never Used  Substance and Sexual Activity  . Alcohol use: No  . Drug use: No  . Sexual activity: Not on file  Lifestyle  . Physical activity:    Days per week: Patient refused    Minutes per session: Patient refused  . Stress: Not on file  Relationships  . Social connections:    Talks on phone: Patient refused    Gets together: Patient refused    Attends religious service: Patient refused    Active member of club or organization: Patient refused    Attends meetings of clubs or organizations: Patient refused    Relationship status: Patient refused  Other Topics Concern  . Not on file  Social  History Narrative   Patient is Widowed. 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   Recently moved from her own home in [place], [facility]   No Smoking history, No Alcohol history   Patient has Advanced planning documents: Living Will, [facility]  Family History  Problem Relation Age of Onset  . Hypertension Mother   . Stroke Mother   . Heart disease Mother   . Hypertension Father   . Hypertension Brother    Scheduled Meds: Continuous Infusions: . dextrose 5 % and 0.45% NaCl 75 mL/hr at 02/06/19 0831   PRN Meds:.albuterol, HYDROmorphone (DILAUDID) injection, ondansetron **OR** ondansetron (ZOFRAN) IV, oxyCODONE Allergies  Allergen Reactions  . Sulfa Antibiotics Rash    Rash w/ sulfonomides furosemide, meloxicam  . Codeine     unknown  . Penicillins     unknown  . Sulfonylureas     unknown  . Clindamycin/Lincomycin Itching and Rash   Review of Systems  Unable to perform ROS: Dementia    Physical Exam Vitals signs and nursing note reviewed.  Constitutional:      Appearance: He is ill-appearing.  Cardiovascular:     Rate and Rhythm: Normal rate.  Pulmonary:     Effort: Pulmonary effort is normal. No tachypnea, accessory muscle usage or respiratory distress.  Abdominal:     Palpations: Abdomen is soft.  Neurological:     Mental Status: He is alert. He is confused.     Vital Signs: BP (!) 143/59 (BP Location: Left Arm)   Pulse 77   Temp 98.9 F (37.2 C) (Oral)   Resp 15   Ht _0  (1.676 m)   Wt 70.8 kg   SpO2 100%   BMI 25.18 kg/m  Pain Scale: PAINAD POSS *See Group Information*: 2-Acceptable,Slightly drowsy, easily aroused Pain Score: 3    SpO2: SpO2: 100 % O2 Device:SpO2: 100 % O2 Flow Rate: .O2 Flow Rate (L/min): 2 L/min  IO: Intake/output summary:    Intake/Output Summary (Last 24 hours) at 02/06/2019 1333 Last data filed at 02/06/2019 1000 Gross per 24 hour  Intake 1098.39 ml  Output 100 ml  Net 998.39 ml    LBM: Last BM Date: (unknown) Baseline Weight: Weight: 70.8 kg Most recent weight: Weight: 70.8 kg     Palliative Assessment/Data: 30%     Time In: 1330 Time Out: 1410 Time Total: 40 min Greater than 50%  of this time was spent counseling and coordinating care related to the above assessment and plan.  Signed by: Vinie Sill, NP Palliative Medicine Team Pager # [phone] (M-F 8a-5p) Team Phone # [phone] (Nights/Weekends)

## 2019-02-06 NOTE — Evaluation (Signed)
Physical Therapy Evaluation Patient Details Name: Brittany Schultz MRN: [id number] DOB: 08-21-1922 Today's Date: 02/06/2019   History of Present Illness  85 y.o. adult with past medical history of essential hypertension, dyslipidemia, atrial fibrillation no longer on anticoagulation, history of venous insufficiency, lower extremity wounds, chronic diastolic CHF, previous documented history of hypothyroidism but not noted to be on levothyroxine currently, hospitalized in December for suspected GI bleed who lives in an assisted living facility. Patient apparently had a mechanical fall at the assisted living facility.  She fell backwards and hit her head. Evaluation in the emergency department revealed an unstable cervical spine fracture.  She was also found to have thoracic compression fractures.  Evaluation also raised concern for thoracic masses possibly esophageal in origin.  Neurosurgery was consulted.  Due to her comorbidities and advanced age she is not thought to be a candidate for surgical intervention.  Patient subsequently was noted to be hypoxic requiring oxygen.  She does have bilateral pleural effusion on imaging studies.    Clinical Impression  Pt admitted with above diagnosis. Pt currently with functional limitations due to the deficits listed below (see PT Problem List).  Pt will benefit from skilled PT to increase their independence and safety with mobility to allow discharge to the venue listed below.  Pt more alert and awake sitting EOB however fatigues quickly.  Pt requires total assist for bed mobility.  Pt with pain with movement however reports none when resting.  Pt poor historian and no family present.  Palliative consult pending.  Pt will likely require higher level of care upon d/c.     Follow Up Recommendations SNF    Equipment Recommendations  Wheelchair (measurements PT);Wheelchair cushion (measurements PT)    Recommendations for Other Services       Precautions /  Restrictions Precautions Precautions: Fall;Cervical Precaution Comments: unstable cervical fractures Restrictions Weight Bearing Restrictions: (Aspen collar on neck) Other Position/Activity Restrictions: cervical and thoracic fractures      Mobility  Bed Mobility Overal bed mobility: Needs Assistance Bed Mobility: Rolling;Sidelying to Sit;Sit to Supine Rolling: Total assist;+2 for safety/equipment Sidelying to sit: Total assist;+2 for physical assistance;+2 for safety/equipment   Sit to supine: Total assist;+2 for physical assistance   General bed mobility comments: Pt initiating attempts to assist but ultimately total A for all aspects of bed mobility. Posterior lean. Close guarding/assist to neck and back provided. Hard collar on throughout session.  Transfers   Equipment used: Actuary)             General transfer comment: Not attempted this session  Ambulation/Gait                Stairs            Wheelchair Mobility    Modified Rankin (Stroke Patients Only)       Balance Overall balance assessment: Needs assistance Sitting-balance support: Bilateral upper extremity supported;Feet supported Sitting balance-Leahy Scale: Zero Sitting balance - Comments: poor to zero sitting balance. Posterior lean. Able at times to sit with mod A. Fatigued quickly.  Postural control: Posterior lean                                   Pertinent Vitals/Pain Pain Assessment: Faces Faces Pain Scale: Hurts whole lot Pain Location: unspecified but most likely site of fractures. grimacing with bed mobility Pain Descriptors / Indicators: Grimacing;Moaning Pain Intervention(s): Limited activity within patient's tolerance;Monitored  during session;Repositioned    Home Living Family/patient expects to be discharged to:: Assisted living                 Additional Comments: unknown, pt poor historian    Prior Function           Comments:  unsure, pt poor historian. per chart review pt used a walker.      Hand Dominance        Extremity/Trunk Assessment   Upper Extremity Assessment Upper Extremity Assessment: Generalized weakness;Difficult to assess due to impaired cognition    Lower Extremity Assessment Lower Extremity Assessment: Generalized weakness;Difficult to assess due to impaired cognition(per RN reports, severe L hip OA)    Cervical / Trunk Assessment Cervical / Trunk Assessment: Other exceptions Cervical / Trunk Exceptions: hard cervial collar, multiple cervical and thoracic fractures  Communication   Communication: HOH  Cognition Arousal/Alertness: Lethargic(arousable, eyes most often closed) Behavior During Therapy: WFL for tasks assessed/performed Overall Cognitive Status: No family/caregiver present to determine baseline cognitive functioning                                        General Comments      Exercises     Assessment/Plan    PT Assessment Patient needs continued PT services  PT Problem List Decreased strength;Decreased mobility;Decreased activity tolerance;Decreased balance;Decreased knowledge of use of DME;Pain;Decreased knowledge of precautions;Decreased safety awareness;Decreased cognition       PT Treatment Interventions DME instruction;Functional mobility training;Balance training;Gait training;Therapeutic activities;Therapeutic exercise;Patient/family education;Wheelchair mobility training    PT Goals (Current goals can be found in the Care Plan section)  Acute Rehab PT Goals Patient Stated Goal: unable to state PT Goal Formulation: Patient unable to participate in goal setting Time For Goal Achievement: 02/20/19 Potential to Achieve Goals: Fair    Frequency Min 2X/week   Barriers to discharge        Co-evaluation PT/OT/SLP Co-Evaluation/Treatment: Yes Reason for Co-Treatment: Complexity of the patient's impairments (multi-system involvement) PT  goals addressed during session: Mobility/safety with mobility OT goals addressed during session: ADL's and self-care       AM-PAC PT "6 Clicks" Mobility  Outcome Measure Help needed turning from your back to your side while in a flat bed without using bedrails?: Total Help needed moving from lying on your back to sitting on the side of a flat bed without using bedrails?: Total Help needed moving to and from a bed to a chair (including a wheelchair)?: Total Help needed standing up from a chair using your arms (e.g., wheelchair or bedside chair)?: Total Help needed to walk in hospital room?: Total Help needed climbing 3-5 steps with a railing? : Total 6 Click Score: 6    End of Session Equipment Utilized During Treatment: Cervical collar Activity Tolerance: Patient limited by pain;Patient limited by fatigue Patient left: in bed;with call bell/phone within reach;with bed alarm set Nurse Communication: Mobility status PT Visit Diagnosis: Other abnormalities of gait and mobility (R26.89)    Time: 1036-1100 PT Time Calculation (min) (ACUTE ONLY): 24 min   Charges:   PT Evaluation $PT Eval Low Complexity: 1 Low         Zenovia Jarred, PT, DPT Acute Rehabilitation Services Office: [phone] Pager: [phone]  Sarajane Jews 02/06/2019, 1:38 PM

## 2019-02-06 NOTE — Progress Notes (Signed)
PROGRESS NOTE  Brittany Schultz ZOX:[id number] DOB: March 04, 1922 DOA: 02/05/2019 PCP: Brittany Coxasanayaka, Gayani Y, MD   LOS: 1 day   Brief Narrative / Interim history: 85 year old female from ALF with history of hypertension, dyslipidemia, atrial fibrillation no longer on anticoagulation, history of venous insufficiency, lower extremity wounds, chronic diastolic CHF and unstable gait, debility, ?deaf presenting with mechanical fall hitting her head.  CT head negative for acute intracranial process.  CT cervical spine showed unstable cervical spine injury with multiple fractures including disruption of the anterior and posterior long-term ligaments of C6-C7, market widening of the C6-C7 disc space with anterolisthesis, bilateral lamina fractures of C6, right and intra-articular facet fracture at C7, spinous process fractures at C5-T1 and anterior wedge compression fracture at T2 with approximately 60% weight loss. She was also hypoxic requiring oxygen and found to have small to moderate bilateral pleural effusion, collapse/consolidation in the right middle and lower lobes, possible proximal esophageal mass or bulky mediastinal on CT chest.  Lymphadenopathy markedly compressing the esophagus.  Neurosurgery consulted on admission and does not think she is a candidate for surgical intervention given comorbidities and advanced age, so recommended neck collar.   See individual problem list below for more.  Subjective: Patient is somnolent but does not appear to be in distress.  Does not follow command.  Neck collar in place.  Not able to provide history.  No family by bedside.  "Deaf" listed on her problem list.  Assessment & Plan: Principal Problem:   Unstable cervical spine Active Problems:   A-fib (HCC)   Bilateral pleural effusion   Mass in chest   Thoracic compression fracture (HCC)   Mechanical fall Unstable cervical spine with multiple fractures (spinous process fractures C5-T1) Anterior wedge  compression fracture at T2 with approximately 60% weight loss -Not a surgical candidate given comorbidities and advanced age per neurosurgery. -Continue neck collar and pain control. -Continue D5-0.45% NaCl at 75 cc/h. -Palliative care  Acute respiratory failure with hypoxia: Multifactorial: Small-to-moderate bilateral pleural effusion, collapse/consolidation in the right middle and lower lobe, and sedation from pain medications -Continue oxygen -Continue pain management cautiously  Mediastinal mass: unclear if this is esophageal mass or mediastinal lymphadenopathy. -EGD not possible due to cervical fractures -No further evaluation per discussion between admitting EDP and granddaughter (HCPOA)  Leukocytosis: Improved likely stress-induced versus infection.  Improved.  Goal of care: Per admitting provider, "patient is DNR based on my conversation with patient's granddaughter and as per patient's previously known wishes.  In view of all of the above issues we will consult palliative medicine for goals of care.  It would also be important to see how the patient does over the next 24 to 48 hours.  If she does not progress or continues to decline then we may have to transition her to hospice/comfort care.  Will request speech therapy to evaluate her swallow function tomorrow before we will initiate a diet"  Other chronic medical conditions stable  Scheduled Meds: Continuous Infusions: . dextrose 5 % and 0.45% NaCl 75 mL/hr at 02/06/19 0831   PRN Meds:.albuterol, HYDROmorphone (DILAUDID) injection, ondansetron **OR** ondansetron (ZOFRAN) IV, oxyCODONE  DVT prophylaxis: SCD Code Status: DNR Family Communication: None at bedside.  Attempted to call patient's granddaughter, HCPOA but  no answer. Disposition Plan: Remains inpatient.  Consultants:   Neurosurgery  Palliative care  Procedures:   Neck Collar  Objective: Vitals:   02/05/19 1303 02/05/19 1712 02/05/19 2046 02/06/19 0543    BP: 126/78 124/69 122/62 (!) 143/59  Pulse: 97 (!) 101 76 77  Resp: 16 20 17 15   Temp: 98.6 F (37 C) 98.2 F (36.8 C) 99.2 F (37.3 C) 98.9 F (37.2 C)  TempSrc: Oral Axillary Oral Oral  SpO2: 93% 97% 100% 100%  Weight:      Height:        Intake/Output Summary (Last 24 hours) at 02/06/2019 1208 Last data filed at 02/06/2019 1000 Gross per 24 hour  Intake 1098.39 ml  Output 100 ml  Net 998.39 ml   Filed Weights   02/05/19 0844  Weight: 70.8 kg    Examination:  GENERAL: Somnolent.  Does not follow command.  No acute distress. EYES -was not able to open her eyes. NOSE- no gross deformity or drainage NECK: in neck collar LUNGS:  No IWOB.  Fair air movement bilaterally. HEART: RRR. Heart sounds normal. ABD: Bowel sounds present. Soft. Non tender.  MSK/EXT: Both lower extremities wrapped in Kerlix.  No apparent swelling or erythema proximally or distally. SKIN: Bilateral venous stasis NEURO: Somnolent.  Does not follow command.  PERRLA.  Symmetric patellar reflexes.  Further exam limited due to patient's inability to follow command. PSYCH: Somnolent.  Calm.   Data Reviewed: I have independently reviewed following labs and imaging studies  CBC: Recent Labs  Lab 02/05/19 1301 02/06/19 0427  WBC 17.4* 11.8*  HGB 11.9* 11.5*  HCT 38.0 38.3  MCV 96.7 100.5*  PLT 208 168   Basic Metabolic Panel: Recent Labs  Lab 02/05/19 1301 02/06/19 0427  NA 136 135  K 3.9 4.5  CL 101 102  CO2 24 24  GLUCOSE 128* 140*  BUN 26* 26*  CREATININE 0.94 0.94  CALCIUM 8.2* 8.1*   GFR: Estimated Creatinine Clearance (by C-G formula based on SCr of 0.94 mg/dL) Female: 16.134.3 mL/min Female: 43.4 mL/min Liver Function Tests: Recent Labs  Lab 02/05/19 1301  AST 33  ALT 26  ALKPHOS 87  BILITOT 0.8  PROT 7.3  ALBUMIN 3.3*   No results for input(s): LIPASE, AMYLASE in the last 168 hours. No results for input(s): AMMONIA in the last 168 hours. Coagulation Profile: No results  for input(s): INR, PROTIME in the last 168 hours. Cardiac Enzymes: No results for input(s): CKTOTAL, CKMB, CKMBINDEX, TROPONINI in the last 168 hours. BNP (last 3 results) No results for input(s): PROBNP in the last 8760 hours. HbA1C: No results for input(s): HGBA1C in the last 72 hours. CBG: No results for input(s): GLUCAP in the last 168 hours. Lipid Profile: No results for input(s): CHOL, HDL, LDLCALC, TRIG, CHOLHDL, LDLDIRECT in the last 72 hours. Thyroid Function Tests: Recent Labs    02/06/19 0427  TSH 0.821   Anemia Panel: No results for input(s): VITAMINB12, FOLATE, FERRITIN, TIBC, IRON, RETICCTPCT in the last 72 hours. Urine analysis:    Component Value Date/Time   COLORURINE YELLOW 11/19/2018 0836   APPEARANCEUR CLEAR 11/19/2018 0836   LABSPEC 1.017 11/19/2018 0836   PHURINE 7.0 11/19/2018 0836   GLUCOSEU NEGATIVE 11/19/2018 0836   HGBUR NEGATIVE 11/19/2018 0836   BILIRUBINUR NEGATIVE 11/19/2018 0836   KETONESUR NEGATIVE 11/19/2018 0836   PROTEINUR NEGATIVE 11/19/2018 0836   UROBILINOGEN 0.2 10/30/2014 2242   NITRITE NEGATIVE 11/19/2018 0836   LEUKOCYTESUR NEGATIVE 11/19/2018 0836   Sepsis Labs: Invalid input(s): PROCALCITONIN, LACTICIDVEN  No results found for this or any previous visit (from the past 240 hour(s)).    Radiology Studies: Ct Chest W Contrast  Result Date: 02/05/2019 CLINICAL DATA:  Shortness of breath and chest  pain. Status post witnessed fall. EXAM: CT CHEST WITH CONTRAST TECHNIQUE: Multidetector CT imaging of the chest was performed during intravenous contrast administration. CONTRAST:  47mL OMNIPAQUE IOHEXOL 300 MG/ML  SOLN COMPARISON:  None. FINDINGS: Cardiovascular: Heart is enlarged. Atherosclerotic calcification is noted in the wall of the thoracic aorta. Coronary artery calcification is evident. No substantial pericardial effusion. Mediastinum/Nodes: Large soft tissue mass is identified in the central upper mediastinum, apparently  incorporating the proximal esophagus. This lesion measures 3.7 x 3.8 x 4.1 cm. Possible lymph node or second area of esophageal enlargement posterior to the carina, measuring 2.8 x 3.3 cm. There is no hilar lymphadenopathy. There is no axillary lymphadenopathy. Lungs/Pleura: The central tracheobronchial airways are patent. Interlobular septal thickening noted in the lungs bilaterally with patchy areas of ground-glass attenuation. Areas of mosaic attenuation in the lungs bilaterally are nonspecific but likely reflect small airways disease. Loculated fluid noted in the right major fissure the and in the posterior hemithorax. Fine detail of lung parenchyma in the bases is obscured by breathing motion. Small free-flowing left pleural effusion evident. Upper Abdomen: Cortical scarring noted left kidney. Otherwise unremarkable Musculoskeletal: No worrisome lytic or sclerotic osseous abnormality. See thoracic spine CT performed today for definitive characterization of thoracic spinal anatomy. IMPRESSION: 1. Soft tissue masses identified in the mediastinum. The more cranial of the 2 incorporates the proximal esophagus and esophageal tissue can not be discriminated as separate from this soft tissue. This may be a proximal esophageal mass or bulky mediastinal lymphadenopathy markedly compressing the esophagus. A second soft tissue mass is identified posterior to the carina, again without ability to discriminate the esophagus as a separate structure. Barium swallow or upper endoscopy may prove helpful to further evaluate. 2. Moderate loculated right pleural effusion including fluid loculated in the right major fissure. Small free-flowing left pleural effusion evident. 3. Collapse/consolidation in the right middle and lower lobes. 4.  Aortic Atherosclerois (ICD10-170.0) 5. See thoracic spine CT report from today for definitive characterization of thoracic spinal anatomy. Electronically Signed   By: Kennith Center M.D.   On:  02/05/2019 14:54   Dg Chest Portable 1 View  Result Date: 02/05/2019 CLINICAL DATA:  Status post fall. Hypoxia. EXAM: PORTABLE CHEST 1 VIEW COMPARISON:  11/19/2018 FINDINGS: Enlarged cardiac silhouette. Calcific atherosclerotic disease of the aorta. There is no evidence of pneumothorax. Moderate right and small left pleural effusion. Pulmonary vascular congestion. Osseous structures are without acute abnormality. Soft tissues are grossly normal. IMPRESSION: 1. Enlarged cardiac silhouette with pulmonary vascular congestion. 2. Moderate right and small left pleural effusions. Electronically Signed   By: Ted Mcalpine M.D.   On: 02/05/2019 13:55    Antionne Enrique T. [facility] Pager [phone]  If 7PM-7AM, please contact night-coverage [email or web address] Password [facility] 02/06/2019, 12:08 PM

## 2019-02-07 MED ORDER — HALOPERIDOL 2 MG PO TABS
2.0000 mg | ORAL_TABLET | Freq: Three times a day (TID) | ORAL | Status: DC | PRN
  Filled 2019-02-07: qty 1

## 2019-02-07 MED ORDER — HALOPERIDOL LACTATE 5 MG/ML IJ SOLN
2.0000 mg | Freq: Four times a day (QID) | INTRAMUSCULAR | Status: DC | PRN
  Filled 2019-02-07: qty 1

## 2019-02-07 MED ORDER — QUETIAPINE FUMARATE 25 MG PO TABS: 25.0000 mg | ORAL_TABLET | Freq: Three times a day (TID) | ORAL | Status: DC | PRN

## 2019-02-07 MED ORDER — ACETAMINOPHEN 160 MG/5ML PO SOLN
500.0000 mg | Freq: Three times a day (TID) | ORAL | Status: DC
  Filled 2019-02-07: qty 20.3

## 2019-02-07 MED ORDER — HYDROCERIN EX CREA
TOPICAL_CREAM | Freq: Every day | CUTANEOUS | Status: DC
  Administered 2019-02-07 – 2019-02-09 (×3): via TOPICAL
  Filled 2019-02-07: qty 113

## 2019-02-07 NOTE — Progress Notes (Addendum)
Palliative:  I met today at Ms. Brittany Schultz's bedside. She is alert, no distress, but confused. Her hearing aide is also out and she cannot hear me often even when I am yelling close to her ear - I am sure this is adding to her confusion. She appears fairly comfortably but RN reports she has been restless. I do not doubt that she has pain that could be also adding to her confusion.   I called and spoke with HCPOA/granddaughter/Brittany Schultz. Brittany Schultz tells me that she was here for lunch and that she attempted to help her grandmother with lunch although she was very anxious to do this. She said that Ms. Brittany Schultz did not indicate pain with swallowing as she had previously during her visit but that she only accepted a few bites from her. No overt aspiration noted by family. Brittany Schultz notes that Ms. Brittany Schultz had no complaints or issues with swallowing prior to fall/hospitalization and was eating/drinking very well. We discussed the position of the aspen collar in combination with unstable fractures (no plans for surgical intervention) and mass could impact her ability to swallow and even her appetite. Brittany Schultz understands and is very realistic. When we discuss options to move forward Brittany Schultz agrees that ALF would not be able to meet her grandmother's needs (I agree) and would like to pursue placement at [facility]. Brittany Schultz's husband is on the board at [facility] and they very much are interested in placement here.   Exam: Alert, confused. No distress. Aspen collar in place.   Plan: - Scheduled Tylenol for pain management. PRN OxyIR and dilaudid in place as well.  - Comfort focused care.  - Designer, jewellery. High risk for aspiration, poor intake, advanced age likely makes prognosis 2-3 weeks.   25 min  Vinie Sill, NP Palliative Medicine Team Pager # [phone] (M-F 8a-5p) Team Phone # [phone] (Nights/Weekends)

## 2019-02-07 NOTE — Progress Notes (Signed)
SLP Cancellation Note  Patient Details Name: Brittany Schultz MRN: [id number] DOB: [birth year]   Cancelled treatment:       Reason Eval/Treat Not Completed: Other (comment);Patient declined, no reason specified  Pt is agitated and declined to consume any po with this SLP. She grabbed SLP glove/finger and needed total cues to let go.  Pt requests to leave = stating "I thought you were nice". Informed her she had suffered a fall and had a spine injury - thus was not able to leave.    Donavan Burnet, MS [facility] SLP Acute Rehab Services Pager [phone] Office [phone]    Chales Abrahams 02/07/2019, 5:07 PM

## 2019-02-07 NOTE — Progress Notes (Addendum)
PROGRESS NOTE  Brittany LawsRuby B Schultz MVH:[id number] DOB: [birth year] DOA: 02/05/2019 PCP: Angela Coxasanayaka, Gayani Y, MD   LOS: 2 days   Brief Narrative / Interim history: 85 year old female from ALF with history of hypertension, dyslipidemia, atrial fibrillation no longer on anticoagulation, history of venous insufficiency, lower extremity wounds, chronic diastolic CHF and unstable gait, debility, ?deaf presenting with mechanical fall hitting her head.  CT head negative for acute intracranial process.  CT cervical spine showed unstable cervical spine injury with multiple fractures including disruption of the anterior and posterior long-term ligaments of C6-C7, market widening of the C6-C7 disc space with anterolisthesis, bilateral lamina fractures of C6, right and intra-articular facet fracture at C7, spinous process fractures at C5-T1 and anterior wedge compression fracture at T2 with approximately 60% weight loss. She was also hypoxic requiring oxygen and found to have small to moderate bilateral pleural effusion, collapse/consolidation in the right middle and lower lobes, possible proximal esophageal mass or bulky mediastinal on CT chest.  Lymphadenopathy markedly compressing the esophagus.  Neurosurgery consulted on admission and does not think she is a candidate for surgical intervention given comorbidities and advanced age, so recommended neck collar.   See individual problem list below for more.  Subjective: No major events overnight of this morning.  States that she is feeling thirsty.  Denies pain.  She is oriented to herself only.  Follows command.  Assessment & Plan: Principal Problem:   Unstable cervical spine Active Problems:   A-fib (HCC)   Bilateral pleural effusion   Mass in chest   Thoracic compression fracture (HCC)   Cervical spine fracture (HCC)   Pressure injury of skin   Goals of care, counseling/discussion   Palliative care encounter   Mechanical fall Unstable cervical spine  with multiple fractures (spinous process fractures C5-T1) Anterior wedge compression fracture at T2 with approximately 60% weight loss -Not a surgical candidate given comorbidities and advanced age per neurosurgery. -Continue neck collar and pain control. -Continue D5-0.45% NaCl at 75 cc/h. -Dysphagia diet -Palliative care consulted  Encephalopathy: Likely toxic from pain medications.  She is awake and alert but somewhat confused. -Delirium precaution -Manage pain cautiously  Dysphagia-1 -Appreciate SLP recommendation--honey thick liquids, medication crushed with pure  Acute respiratory failure with hypoxia: Multifactorial: Small-to-moderate bilateral pleural effusion, collapse/consolidation in the right middle and lower lobe, and sedation from pain medications -Continue oxygen -Continue pain management cautiously  Mediastinal mass: unclear if this is esophageal mass or mediastinal lymphadenopathy. -EGD not possible due to cervical fractures -Granddaughter (HCPOA) not interested in further work-up  Leukocytosis: Improved likely stress-induced versus infection.  Improved.  Bilateral lower extremity venous stasis/skin lesions -Appreciate wound care input  Goal of care: Per admitting provider, "patient is DNR based on my conversation with patient's granddaughter and as per patient's previously known wishes.  In view of all of the above issues we will consult palliative medicine for goals of care.  It would also be important to see how the patient does over the next 24 to 48 hours.  If she does not progress or continues to decline then we may have to transition her to hospice/comfort care.  Will request speech therapy to evaluate her swallow function tomorrow before we will initiate a diet"  Other chronic medical conditions stable  Scheduled Meds: . hydrocerin   Topical Daily   Continuous Infusions:  PRN Meds:.albuterol, food thickener, HYDROmorphone (DILAUDID) injection,  ondansetron **OR** ondansetron (ZOFRAN) IV, oxyCODONE  DVT prophylaxis: SCD Code Status: DNR Family Communication: None at bedside.  Disposition Plan: Remains inpatient.  Consultants:   Neurosurgery  Palliative care  Procedures:   Neck Collar  Objective: Vitals:   02/05/19 2046 02/06/19 0543 02/06/19 2110 02/07/19 0448  BP: 122/62 (!) 143/59 125/66 130/79  Pulse: 76 77 87 84  Resp: 17 15 14 16   Temp: 99.2 F (37.3 C) 98.9 F (37.2 C) 99.2 F (37.3 C) 98.9 F (37.2 C)  TempSrc: Oral Oral Oral Oral  SpO2: 100% 100% 99% 99%  Weight:      Height:        Intake/Output Summary (Last 24 hours) at 02/07/2019 1429 Last data filed at 02/07/2019 0454 Gross per 24 hour  Intake 262.54 ml  Output 850 ml  Net -587.46 ml   Filed Weights   02/05/19 0844  Weight: 70.8 kg    Examination:  GENERAL: Somnolent.  Does not follow command.  No acute distress. EYES -was not able to open her eyes. NOSE- no gross deformity or drainage NECK: in neck collar LUNGS:  No IWOB.  Fair air movement bilaterally. HEART: RRR. Heart sounds normal. ABD: Bowel sounds present. Soft. Non tender.  MSK/EXT: Both lower extremities wrapped in Kerlix.  No apparent swelling or erythema proximally or distally. SKIN: Bilateral venous stasis NEURO: Somnolent.  Does not follow command.  PERRLA.  Symmetric patellar reflexes.  Further exam limited due to patient's inability to follow command. PSYCH: Somnolent.  Calm.  GENERAL: Appears well. No acute distress.  HEENT:  Vision and Hearing grossly intact.  NECK: Neck collar in place. LUNGS:  No IWOB.  Fair air movement bilaterally HEART:  RRR. Heart sounds normal.  ABD: Bowel sounds present. Soft. Non tender.  EXT:   no edema bilaterally.  Dressing in place over bilateral lower extremities SKIN: Bilateral venous stasis NEURO: Awake, alert and oriented to self.  Follows command.  Cranial nerves grossly intact.  Grip strength is 5/5.  Lower extremity 3/5 with  extension.  Patellar reflex symmetric.  PSYCH: Calm. Normal affect.  Data Reviewed: I have independently reviewed following labs and imaging studies  CBC: Recent Labs  Lab 02/05/19 1301 02/06/19 0427  WBC 17.4* 11.8*  HGB 11.9* 11.5*  HCT 38.0 38.3  MCV 96.7 100.5*  PLT 208 168   Basic Metabolic Panel: Recent Labs  Lab 02/05/19 1301 02/06/19 0427  NA 136 135  K 3.9 4.5  CL 101 102  CO2 24 24  GLUCOSE 128* 140*  BUN 26* 26*  CREATININE 0.94 0.94  CALCIUM 8.2* 8.1*   GFR: Estimated Creatinine Clearance (by C-G formula based on SCr of 0.94 mg/dL) Female: 16.1 mL/min Female: 43.4 mL/min Liver Function Tests: Recent Labs  Lab 02/05/19 1301  AST 33  ALT 26  ALKPHOS 87  BILITOT 0.8  PROT 7.3  ALBUMIN 3.3*   No results for input(s): LIPASE, AMYLASE in the last 168 hours. No results for input(s): AMMONIA in the last 168 hours. Coagulation Profile: No results for input(s): INR, PROTIME in the last 168 hours. Cardiac Enzymes: No results for input(s): CKTOTAL, CKMB, CKMBINDEX, TROPONINI in the last 168 hours. BNP (last 3 results) No results for input(s): PROBNP in the last 8760 hours. HbA1C: No results for input(s): HGBA1C in the last 72 hours. CBG: No results for input(s): GLUCAP in the last 168 hours. Lipid Profile: No results for input(s): CHOL, HDL, LDLCALC, TRIG, CHOLHDL, LDLDIRECT in the last 72 hours. Thyroid Function Tests: Recent Labs    02/06/19 0427  TSH 0.821   Anemia Panel: No results for input(s):  VITAMINB12, FOLATE, FERRITIN, TIBC, IRON, RETICCTPCT in the last 72 hours. Urine analysis:    Component Value Date/Time   COLORURINE YELLOW 11/19/2018 0836   APPEARANCEUR CLEAR 11/19/2018 0836   LABSPEC 1.017 11/19/2018 0836   PHURINE 7.0 11/19/2018 0836   GLUCOSEU NEGATIVE 11/19/2018 0836   HGBUR NEGATIVE 11/19/2018 0836   BILIRUBINUR NEGATIVE 11/19/2018 0836   KETONESUR NEGATIVE 11/19/2018 0836   PROTEINUR NEGATIVE 11/19/2018 0836    UROBILINOGEN 0.2 10/30/2014 2242   NITRITE NEGATIVE 11/19/2018 0836   LEUKOCYTESUR NEGATIVE 11/19/2018 0836   Sepsis Labs: Invalid input(s): PROCALCITONIN, LACTICIDVEN  No results found for this or any previous visit (from the past 240 hour(s)).    Radiology Studies: No results found.  Taye T. [facility] Pager [phone]  If 7PM-7AM, please contact night-coverage [email or web address] Password [facility] 02/07/2019, 2:29 PM

## 2019-02-07 NOTE — Consult Note (Signed)
WOC Nurse wound consult note Reason for Consult: Bilateral full thickness wounds Wound type: suspect arterial insufficiency vs venous insufficiency as lesions are punctate, and not at the malleolar areas. Heels intact Pressure Injury POA: NA Measurement: Four lesions on LLE, the largest at pretibial and measures 2.8cm x 1.4cm x 0.2cm with pale pink base and serous exudate. RLE with two full thickness lesions, both circular and the largest measuring 2cm x 1cm x 0.2cm. Wound bed:AS described above.  Small amount of serous exudate. Drainage (amount, consistency, odor) As described above Periwound:very dry, scaly. No edema Dressing procedure/placement/frequency: I will provide guidance for Nursing via the Orders for once daily washing of the LEs with soap and water, then moisturizing with Eucerin cream.  Topical wound care will be with xeroform gauze securing with Kerlix roll gauze wrap/paper tape. Feet are to be floated in Prevalon Boots and a sacral prophylactic dressing for pressure injury prevention.  Cervical collar is to be padded with silicone foam at chin and any other bony prominences to prevent pressure injury.  WOC nursing team will not follow, but will remain available to this patient, the nursing and medical teams.  Please re-consult if needed. Thanks, Ladona Mow, MSN, RN, GNP, [place]  Pager# [phone]

## 2019-02-08 MED ORDER — LIP MEDEX EX OINT
TOPICAL_OINTMENT | CUTANEOUS | Status: AC
  Administered 2019-02-08: 11:00:00
  Filled 2019-02-08: qty 7

## 2019-02-08 NOTE — Plan of Care (Signed)
Pt remains stable though very weak and tired overall. Pt remains mostly npo due to painful swallowing. Pt is not even able to swallow pills and liquids well. Rn medicating for pain using pain aid score. Pt very tired and mostly nonverbal though she will open her eyes to touch.

## 2019-02-08 NOTE — Progress Notes (Signed)
Palliative:  Ms. Auriel is sleeping comfortably. I did not awaken. No family at bedside. Plans for transition to [facility]. Discussed with RN who reports higher need for pain medication but that medication is keeping her comfortable. Reports severe odynophagia impacting ability to tolerate any po intake. Will d/c po meds and adjust medications for comfort prior to hospice transfer.   No charge  Yong Channel, NP Palliative Medicine Team Pager # [phone] (M-F 8a-5p) Team Phone # [phone] (Nights/Weekends)

## 2019-02-08 NOTE — Progress Notes (Signed)
PROGRESS NOTE  Brittany Schultz:[id number] DOB: 1922/02/14 DOA: 02/05/2019 PCP: Angela Cox, MD   LOS: 3 days   Brief Narrative / Interim history: 85 year old female from ALF with history of hypertension, dyslipidemia, atrial fibrillation no longer on anticoagulation, history of venous insufficiency, lower extremity wounds, chronic diastolic CHF and unstable gait, debility, ?deaf presenting with mechanical fall hitting her head.  CT head negative for acute intracranial process.  CT cervical spine showed unstable cervical spine injury with multiple fractures including disruption of the anterior and posterior long-term ligaments of C6-C7, market widening of the C6-C7 disc space with anterolisthesis, bilateral lamina fractures of C6, right and intra-articular facet fracture at C7, spinous process fractures at C5-T1 and anterior wedge compression fracture at T2 with approximately 60% weight loss. She was also hypoxic requiring oxygen and found to have small to moderate bilateral pleural effusion, collapse/consolidation in the right middle and lower lobes, possible proximal esophageal mass or bulky mediastinal on CT chest.  Lymphadenopathy markedly compressing the esophagus.  Neurosurgery consulted on admission and does not think she is a candidate for surgical intervention given comorbidities and advanced age, so recommended neck collar.   Palliative care consulted.  After discussion with HCPOA/granddaughter/Rena, patient transitioned to comfort care.  Accepted at [facility].  She will be transferred on 02/09/2019.  See individual problem list below for more.  Subjective: No major events overnight of this morning. Was sleeping comfortably this morning.  Assessment & Plan: Principal Problem:   Unstable cervical spine Active Problems:   A-fib (HCC)   Bilateral pleural effusion   Mass in chest   Thoracic compression fracture (HCC)   Cervical spine fracture (HCC)   Pressure injury of  skin   Goals of care, counseling/discussion   Palliative care encounter   Mechanical fall Unstable cervical spine with multiple fractures (spinous process fractures C5-T1) Anterior wedge compression fracture at T2 with approximately 60% weight loss -Not a surgical candidate given comorbidities and advanced age per neurosurgery. -Continue neck collar and pain control. -Dysphagia diet -Scheduled Tylenol and as needed oxycodone plus Dilaudid for pain -Palliative care consulted-hospice at [facility]  Encephalopathy: Improved but still with waxing or waning mental status and agitation. -Delirium precaution -PRN Haldol  Dysphagia-1 -Appreciate SLP recommendation--honey thick liquids, medication crushed with pure  Acute respiratory failure with hypoxia: Multifactorial: Small-to-moderate bilateral pleural effusion, collapse/consolidation in the right middle and lower lobe, and sedation from pain medications -Continue oxygen for comfort. -Continue pain management cautiously  Mediastinal mass: unclear if this is esophageal mass or mediastinal lymphadenopathy. -Granddaughter (HCPOA) not interested in further work-up  Leukocytosis: Improved likely stress-induced versus infection.  Improved.  Bilateral lower extremity venous stasis/skin lesions -Appreciate wound care input  Other chronic medical conditions stable  Scheduled Meds: . acetaminophen (TYLENOL) oral liquid 160 mg/5 mL  500 mg Oral TID  . hydrocerin   Topical Daily   Continuous Infusions:  PRN Meds:.albuterol, food thickener, haloperidol **OR** haloperidol lactate, HYDROmorphone (DILAUDID) injection, ondansetron **OR** ondansetron (ZOFRAN) IV, oxyCODONE  DVT prophylaxis: None.  Patient is comfort care. Code Status: DNR Family Communication: None at bedside.   Disposition Plan: Transfer to [facility] on 02/09/2019.  Consultants:   Neurosurgery  Palliative care  Procedures:   Neck Collar  Objective: Vitals:     02/07/19 1452 02/07/19 2315 02/08/19 0618 02/08/19 1125  BP: 121/64 (!) 153/82 (!) 151/71   Pulse: (!) 101 89 81 90  Resp: 14 16 18 16   Temp: 98 F (36.7 C) 98.6 F (  37 C) 98.5 F (36.9 C)   TempSrc: Oral Oral    SpO2: 96% 99% 100% 97%  Weight:      Height:        Intake/Output Summary (Last 24 hours) at 02/08/2019 1342 Last data filed at 02/08/2019 0857 Gross per 24 hour  Intake 10 ml  Output 200 ml  Net -190 ml   Filed Weights   02/05/19 0844  Weight: 70.8 kg    Examination:  GENERAL: Sleeping.  No acute distress. NECK: Neck collar in place. LUNGS:  No IWOB. NEURO: No acute distress. PSYCH: Calm.  Data Reviewed: I have independently reviewed following labs and imaging studies  CBC: Recent Labs  Lab 02/05/19 1301 02/06/19 0427  WBC 17.4* 11.8*  HGB 11.9* 11.5*  HCT 38.0 38.3  MCV 96.7 100.5*  PLT 208 168   Basic Metabolic Panel: Recent Labs  Lab 02/05/19 1301 02/06/19 0427  NA 136 135  K 3.9 4.5  CL 101 102  CO2 24 24  GLUCOSE 128* 140*  BUN 26* 26*  CREATININE 0.94 0.94  CALCIUM 8.2* 8.1*   GFR: Estimated Creatinine Clearance (by C-G formula based on SCr of 0.94 mg/dL) Female: 16.0 mL/min Female: 43.4 mL/min Liver Function Tests: Recent Labs  Lab 02/05/19 1301  AST 33  ALT 26  ALKPHOS 87  BILITOT 0.8  PROT 7.3  ALBUMIN 3.3*   No results for input(s): LIPASE, AMYLASE in the last 168 hours. No results for input(s): AMMONIA in the last 168 hours. Coagulation Profile: No results for input(s): INR, PROTIME in the last 168 hours. Cardiac Enzymes: No results for input(s): CKTOTAL, CKMB, CKMBINDEX, TROPONINI in the last 168 hours. BNP (last 3 results) No results for input(s): PROBNP in the last 8760 hours. HbA1C: No results for input(s): HGBA1C in the last 72 hours. CBG: No results for input(s): GLUCAP in the last 168 hours. Lipid Profile: No results for input(s): CHOL, HDL, LDLCALC, TRIG, CHOLHDL, LDLDIRECT in the last 72  hours. Thyroid Function Tests: Recent Labs    02/06/19 0427  TSH 0.821   Anemia Panel: No results for input(s): VITAMINB12, FOLATE, FERRITIN, TIBC, IRON, RETICCTPCT in the last 72 hours. Urine analysis:    Component Value Date/Time   COLORURINE YELLOW 11/19/2018 0836   APPEARANCEUR CLEAR 11/19/2018 0836   LABSPEC 1.017 11/19/2018 0836   PHURINE 7.0 11/19/2018 0836   GLUCOSEU NEGATIVE 11/19/2018 0836   HGBUR NEGATIVE 11/19/2018 0836   BILIRUBINUR NEGATIVE 11/19/2018 0836   KETONESUR NEGATIVE 11/19/2018 0836   PROTEINUR NEGATIVE 11/19/2018 0836   UROBILINOGEN 0.2 10/30/2014 2242   NITRITE NEGATIVE 11/19/2018 0836   LEUKOCYTESUR NEGATIVE 11/19/2018 0836   Sepsis Labs: Invalid input(s): PROCALCITONIN, LACTICIDVEN  No results found for this or any previous visit (from the past 240 hour(s)).    Radiology Studies: No results found.  [facility] Pager [phone]  If 7PM-7AM, please contact night-coverage [email or web address] Password [email or web address] 02/08/2019, 1:42 PM

## 2019-02-08 NOTE — Progress Notes (Signed)
SLP Cancellation Note  Patient Details Name: Brittany Schultz MRN: [id number] DOB: 06/04/1922   Cancelled treatment:       Reason Eval/Treat Not Completed: Other (comment)(s/o pt comfort care and for [facility])  Donavan Burnet, MS [facility] SLP Acute Rehab Services Pager [phone] Office [phone]  Chales Abrahams 02/08/2019, 12:35 PM

## 2019-02-08 NOTE — Care Management Important Message (Signed)
Important Message  Patient Details  Name: VIYANA URRA MRN: [id number] Date of Birth: Apr 27, 1922   Medicare Important Message Given:  Yes    Caren Macadam 02/08/2019, 12:31 PMImportant Message  Patient Details  Name: MAÀLLE SCARPINO MRN: [id number] Date of Birth: Sep 30, 1922   Medicare Important Message Given:  Yes    Caren Macadam 02/08/2019, 12:31 PM

## 2019-02-08 NOTE — Progress Notes (Signed)
SLP Cancellation Note  Patient Details Name: Brittany Schultz MRN: [id number] DOB: 12/19/1921   Cancelled treatment:       Reason Eval/Treat Not Completed: Other (comment)(pt currently sound asleep, will continue efforts)   Chales Abrahams 02/08/2019, 11:34 AM  Donavan Burnet, MS [facility] SLP Acute Rehab Services Pager [phone] Office [phone]

## 2019-02-08 NOTE — Progress Notes (Signed)
CSW consult- "Family wants to work with [facility]) for [facility] placement"  CSW reached out to [place] with [facility] for [facility]. [place] will follow up with the patient family.   CSW will continue to assist with the patient disposition.   Vivi Barrack, [place], MSW Clinical Social Worker  [phone] 02/08/2019  9:22 AM

## 2019-02-08 NOTE — Progress Notes (Addendum)
[facility]  Received request from CSW [place] for family interest in [facility]. Chart reviewed and eligibility has been confirmed. [facility] room is available tomorrow. Spoke with [name] by phone to answer questions. He will call me back with time to meet to complete paper work for transfer to [facility]. CSW and Palliative Medicine Team updated.   [facility] paper work completed with Loraine Leriche and Berlinda Last for transfer to [facility] 02/09/2019.  Please send discharge summary to [phone].  RN please call report to [phone].  Thank you,  Forrestine Him, LCSW [phone]

## 2019-02-09 DIAGNOSIS — M532X2 Spinal instabilities, cervical region: Secondary | ICD-10-CM

## 2019-02-09 MED ORDER — POLYVINYL ALCOHOL 1.4 % OP SOLN
1.0000 [drp] | Freq: Four times a day (QID) | OPHTHALMIC | Status: DC | PRN
  Filled 2019-02-09: qty 15

## 2019-02-09 MED ORDER — BIOTENE DRY MOUTH MT LIQD: 15.0000 mL | OROMUCOSAL | Status: DC | PRN

## 2019-02-09 MED ORDER — GLYCOPYRROLATE 0.2 MG/ML IJ SOLN
0.2000 mg | INTRAMUSCULAR | Status: DC | PRN
  Filled 2019-02-09: qty 1

## 2019-02-09 MED ORDER — GLYCOPYRROLATE 1 MG PO TABS
1.0000 mg | ORAL_TABLET | ORAL | Status: DC | PRN
  Filled 2019-02-09: qty 1

## 2019-02-09 NOTE — Progress Notes (Signed)
[facility] room available today for patient. Family completed paper work for transfer.  Please send discharge summary to [phone].  RN please call report to [phone].  Thank you,  Forrestine Him, LCSW [phone]

## 2019-02-09 NOTE — Care Management Note (Signed)
Case Management Note  Patient Details  Name: Brittany Schultz MRN: [id number] Date of Birth: 1922/01/31  Subjective/Objective:                  Discharge planning  Action/Plan: Scheduled to go to [facility] at discharge.  Expected Discharge Date:  02/09/19               Expected Discharge Plan:  Hospice Medical Facility  In-House Referral:  Clinical Social Work  Discharge planning Services  CM Consult  Post Acute Care Choice:    Choice offered to:     DME Arranged:    DME Agency:     HH Arranged:    HH Agency:     Status of Service:  Completed, signed off  If discussed at [employer], dates discussed:    Additional Comments:  Golda Acre, RN 02/09/2019, 10:13 AM

## 2019-02-09 NOTE — Progress Notes (Signed)
[facility] arranged for transport.  CSW notified patient granddaughter Artelia Laroche.  Vivi Barrack, [place], MSW Clinical Social Worker  [phone] 02/09/2019  9:24 AM

## 2019-02-09 NOTE — Progress Notes (Signed)
At 1000, report was provided to [facility], a nurse from [facility]. After providing report, Okey Regal reported no further questions or concerns. The pt is being transported to the facility via [facility].

## 2019-02-09 NOTE — Discharge Summary (Signed)
Physician Discharge Summary  Brittany Schultz QQP:[id number] DOB: 02-03-1922 DOA: 02/05/2019  PCP: Angela Cox, MD  Admit date: 02/05/2019 Discharge date: 02/09/2019  Admitted From: Home Disposition: [facility]   Home Health none  equipment/Devices none Discharge Condition hospice  CODE STATUS: DNR Diet recommendation: Regular dysphagia 1  brief/Interim Summary:85 year old female from ALF with history of hypertension, dyslipidemia, atrial fibrillation no longer on anticoagulation, history of venous insufficiency, lower extremity wounds, chronic diastolic CHF and unstable gait, debility, ?deaf presenting with mechanical fall hitting her head.  CT head negative for acute intracranial process.  CT cervical spine showed unstable cervical spine injury with multiple fractures including disruption of the anterior and posterior long-term ligaments of C6-C7, market widening of the C6-C7 disc space with anterolisthesis, bilateral lamina fractures of C6, right and intra-articular facet fracture at C7, spinous process fractures at C5-T1 and anterior wedge compression fracture at T2 with approximately 60% weight loss. She was also hypoxic requiring oxygen and found to have small to moderate bilateral pleural effusion, collapse/consolidation in the right middle and lower lobes, possible proximal esophageal mass or bulky mediastinal on CT chest.  Lymphadenopathy markedly compressing the esophagus.  Neurosurgery consulted on admission and does not think she is a candidate for surgical intervention given comorbidities and advanced age, so recommended neck collar.   Palliative care consulted.  After discussion with HCPOA/granddaughter/Rena, patient transitioned to comfort care.  Accepted at [facility].  She will be transferred on 02/09/2019.  See individual problem list below for more.   Discharge Diagnoses:  Principal Problem:   Unstable cervical spine Active Problems:   A-fib (HCC)   Bilateral  pleural effusion   Mass in chest   Thoracic compression fracture (HCC)   Cervical spine fracture (HCC)   Pressure injury of skin   Goals of care, counseling/discussion   Palliative care encounter   Mechanical fall Unstable cervical spine with multiple fractures (spinous process fractures C5-T1) Anterior wedge compression fracture at T2 with approximately 60% weight loss -Not a surgical candidate given comorbidities and advanced age per neurosurgery. -Continue neck collar and pain control. -Dysphagia diet -Palliative care consulted-discharge to hospice at [facility]  Encephalopathy:  with waxing or waning mental status and agitation.  Dysphagia-1 -Appreciate SLP recommendation--honey thick liquids, medication crushed with pure  Acute respiratory failure with hypoxia: Multifactorial: Small-to-moderate bilateral pleural effusion, collapse/consolidation in the right middle and lower lobe, and sedation from pain medications -Continue oxygen for comfort.  Mediastinal mass: unclear if this is esophageal mass or mediastinal lymphadenopathy. -Granddaughter (HCPOA) not interested in further work-up   Pressure Injury 02/05/19 Stage I -  Intact skin with non-blanchable redness of a localized area usually over a bony prominence. buttocks pink (Active)  02/05/19 1730  Location: Buttocks  Location Orientation: Medial  Staging: Stage I -  Intact skin with non-blanchable redness of a localized area usually over a bony prominence.  Wound Description (Comments): buttocks pink  Present on Admission: Yes    Estimated body mass index is 25.18 kg/m as calculated from the following:   Height as of this encounter: 5\' 6"  (1.676 m).   Weight as of this encounter: 70.8 kg.  Discharge Instructions  Discharge Instructions    Diet - low sodium heart healthy   Complete by:  As directed    Increase activity slowly   Complete by:  As directed      Allergies as of 02/09/2019      Reactions    Sulfa Antibiotics Rash   Rash  w/ sulfonomides furosemide, meloxicam   Codeine    unknown   Penicillins    unknown   Sulfonylureas    unknown   Clindamycin/lincomycin Itching, Rash      Medication List    STOP taking these medications   acetaminophen 325 MG tablet Commonly known as:  TYLENOL   albuterol (2.5 MG/3ML) 0.083% nebulizer solution Commonly known as:  PROVENTIL   carvedilol 6.25 MG tablet Commonly known as:  COREG   DESITIN 13 % Crea Generic drug:  Zinc Oxide   furosemide 40 MG tablet Commonly known as:  LASIX   hydrALAZINE 25 MG tablet Commonly known as:  APRESOLINE   hydrOXYzine 25 MG tablet Commonly known as:  ATARAX/VISTARIL   JUICE PLUS FIBRE PO   LORazepam 0.5 MG tablet Commonly known as:  ATIVAN   mineral oil external liquid   multivitamin Liqd   pantoprazole 40 MG tablet Commonly known as:  PROTONIX   potassium chloride SA 20 MEQ tablet Commonly known as:  K-DUR,KLOR-CON   sertraline 100 MG tablet Commonly known as:  ZOLOFT   spironolactone 25 MG tablet Commonly known as:  ALDACTONE   thiamine 100 MG tablet   traMADol 50 MG tablet Commonly known as:  ULTRAM   VITAMIN D (ERGOCALCIFEROL) PO       Allergies  Allergen Reactions  . Sulfa Antibiotics Rash    Rash w/ sulfonomides furosemide, meloxicam  . Codeine     unknown  . Penicillins     unknown  . Sulfonylureas     unknown  . Clindamycin/Lincomycin Itching and Rash    Consultations:  Palliative care   Procedures/Studies: Ct Head Wo Contrast  Result Date: 02/05/2019 CLINICAL DATA:  Larey SeatFell backwards. Witnessed fall. C-spine trauma. High clinical risk. EXAM: CT HEAD WITHOUT CONTRAST CT CERVICAL SPINE WITHOUT CONTRAST TECHNIQUE: Multidetector CT imaging of the head and cervical spine was performed following the standard protocol without intravenous contrast. Multiplanar CT image reconstructions of the cervical spine were also generated. COMPARISON:  None. FINDINGS: CT  HEAD FINDINGS Brain: Mild cerebral atrophy. Low-density in the left cerebral hemisphere is nonspecific and could be related to artifact. Difficult to exclude age-indeterminate infarct in this area. Negative for acute hemorrhage, mass lesion or midline shift or hydrocephalus. Subtle low-density in the white matter is suggestive for chronic changes. Vascular: No hyperdense vessel or unexpected calcification. Skull: No acute abnormality. Sinuses/Orbits: Minimal mucosal thickening in the paranasal sinuses. Other: Posterior scalp hematoma best seen on sequence 4, image 31. This scalp hematoma measures up to 3.6 cm based on the sagittal reformats, sequence 7, image 39. CT CERVICAL SPINE FINDINGS Alignment: Marked widening of the disc space at C6-C7 with associated mild anterolisthesis. Skull base and vertebrae: Multiple cervical spine fractures. Nondisplaced fracture involving the C5 spinous process. Bilateral laminal fractures at C6. Right laminal fracture C6 is displaced. Mildly distracted fracture involving the C6 spinous process. Right C7 facet intra-articular fracture. Displaced fracture of the C7 spinous process. Displaced fracture of the T1 spinous process. Anterior wedge compression fracture at C2. Soft tissues and spinal canal: Soft tissue swelling centered around C6-C7 in the cervicothoracic junction. Disc levels: Marked disc widening at C6-C7 suggesting disruption of the anterior and posterior longitudinal ligaments at this level. Upper chest: Extensive calcifications and scarring at the lung apices. There is concern for hematoma or abnormal soft tissue in the upper mediastinum. Small amount of right pleural fluid or thickening. Other: None IMPRESSION: 1. Unstable cervical spine injury with multiple fractures. Evidence for disruption  of the anterior and posterior longitudinal ligaments at C6-C7. There is marked widening of the C6-C7 disc space with anterolisthesis. 2. Bilateral laminal fractures at C6. Right  intra-articular facet fracture at C7. Spinous process fractures at C5, C6, C7 and T1. 3. Anterior wedge compression fracture at T2. 4. Cannot exclude upper mediastinal hematoma. Right pleural fluid or pleural thickening. 5. Negative for intracranial hemorrhage. 6. Indeterminate low-density in the left cerebellar hemisphere. This area is difficult to evaluate due to artifact. Age-indeterminate infarct cannot be excluded. 7. Large scalp hematoma along the posterior vertex. No underlying fracture. These results were called by telephone at the time of interpretation on 02/05/2019 at 12:22 pm to Dr. Azalia BilisKEVIN CAMPOS , who verbally acknowledged these results. Electronically Signed   By: Richarda OverlieAdam  Henn M.D.   On: 02/05/2019 12:29   Ct Chest W Contrast  Result Date: 02/05/2019 CLINICAL DATA:  Shortness of breath and chest pain. Status post witnessed fall. EXAM: CT CHEST WITH CONTRAST TECHNIQUE: Multidetector CT imaging of the chest was performed during intravenous contrast administration. CONTRAST:  75mL OMNIPAQUE IOHEXOL 300 MG/ML  SOLN COMPARISON:  None. FINDINGS: Cardiovascular: Heart is enlarged. Atherosclerotic calcification is noted in the wall of the thoracic aorta. Coronary artery calcification is evident. No substantial pericardial effusion. Mediastinum/Nodes: Large soft tissue mass is identified in the central upper mediastinum, apparently incorporating the proximal esophagus. This lesion measures 3.7 x 3.8 x 4.1 cm. Possible lymph node or second area of esophageal enlargement posterior to the carina, measuring 2.8 x 3.3 cm. There is no hilar lymphadenopathy. There is no axillary lymphadenopathy. Lungs/Pleura: The central tracheobronchial airways are patent. Interlobular septal thickening noted in the lungs bilaterally with patchy areas of ground-glass attenuation. Areas of mosaic attenuation in the lungs bilaterally are nonspecific but likely reflect small airways disease. Loculated fluid noted in the right major  fissure the and in the posterior hemithorax. Fine detail of lung parenchyma in the bases is obscured by breathing motion. Small free-flowing left pleural effusion evident. Upper Abdomen: Cortical scarring noted left kidney. Otherwise unremarkable Musculoskeletal: No worrisome lytic or sclerotic osseous abnormality. See thoracic spine CT performed today for definitive characterization of thoracic spinal anatomy. IMPRESSION: 1. Soft tissue masses identified in the mediastinum. The more cranial of the 2 incorporates the proximal esophagus and esophageal tissue can not be discriminated as separate from this soft tissue. This may be a proximal esophageal mass or bulky mediastinal lymphadenopathy markedly compressing the esophagus. A second soft tissue mass is identified posterior to the carina, again without ability to discriminate the esophagus as a separate structure. Barium swallow or upper endoscopy may prove helpful to further evaluate. 2. Moderate loculated right pleural effusion including fluid loculated in the right major fissure. Small free-flowing left pleural effusion evident. 3. Collapse/consolidation in the right middle and lower lobes. 4.  Aortic Atherosclerois (ICD10-170.0) 5. See thoracic spine CT report from today for definitive characterization of thoracic spinal anatomy. Electronically Signed   By: Kennith CenterEric  Mansell M.D.   On: 02/05/2019 14:54   Ct Cervical Spine Wo Contrast  Result Date: 02/05/2019 CLINICAL DATA:  Larey SeatFell backwards. Witnessed fall. C-spine trauma. High clinical risk. EXAM: CT HEAD WITHOUT CONTRAST CT CERVICAL SPINE WITHOUT CONTRAST TECHNIQUE: Multidetector CT imaging of the head and cervical spine was performed following the standard protocol without intravenous contrast. Multiplanar CT image reconstructions of the cervical spine were also generated. COMPARISON:  None. FINDINGS: CT HEAD FINDINGS Brain: Mild cerebral atrophy. Low-density in the left cerebral hemisphere is nonspecific and  could  be related to artifact. Difficult to exclude age-indeterminate infarct in this area. Negative for acute hemorrhage, mass lesion or midline shift or hydrocephalus. Subtle low-density in the white matter is suggestive for chronic changes. Vascular: No hyperdense vessel or unexpected calcification. Skull: No acute abnormality. Sinuses/Orbits: Minimal mucosal thickening in the paranasal sinuses. Other: Posterior scalp hematoma best seen on sequence 4, image 31. This scalp hematoma measures up to 3.6 cm based on the sagittal reformats, sequence 7, image 39. CT CERVICAL SPINE FINDINGS Alignment: Marked widening of the disc space at C6-C7 with associated mild anterolisthesis. Skull base and vertebrae: Multiple cervical spine fractures. Nondisplaced fracture involving the C5 spinous process. Bilateral laminal fractures at C6. Right laminal fracture C6 is displaced. Mildly distracted fracture involving the C6 spinous process. Right C7 facet intra-articular fracture. Displaced fracture of the C7 spinous process. Displaced fracture of the T1 spinous process. Anterior wedge compression fracture at C2. Soft tissues and spinal canal: Soft tissue swelling centered around C6-C7 in the cervicothoracic junction. Disc levels: Marked disc widening at C6-C7 suggesting disruption of the anterior and posterior longitudinal ligaments at this level. Upper chest: Extensive calcifications and scarring at the lung apices. There is concern for hematoma or abnormal soft tissue in the upper mediastinum. Small amount of right pleural fluid or thickening. Other: None IMPRESSION: 1. Unstable cervical spine injury with multiple fractures. Evidence for disruption of the anterior and posterior longitudinal ligaments at C6-C7. There is marked widening of the C6-C7 disc space with anterolisthesis. 2. Bilateral laminal fractures at C6. Right intra-articular facet fracture at C7. Spinous process fractures at C5, C6, C7 and T1. 3. Anterior wedge  compression fracture at T2. 4. Cannot exclude upper mediastinal hematoma. Right pleural fluid or pleural thickening. 5. Negative for intracranial hemorrhage. 6. Indeterminate low-density in the left cerebellar hemisphere. This area is difficult to evaluate due to artifact. Age-indeterminate infarct cannot be excluded. 7. Large scalp hematoma along the posterior vertex. No underlying fracture. These results were called by telephone at the time of interpretation on 02/05/2019 at 12:22 pm to Dr. Azalia Bilis , who verbally acknowledged these results. Electronically Signed   By: Richarda Overlie M.D.   On: 02/05/2019 12:29   Ct Thoracic Spine Wo Contrast  Result Date: 02/05/2019 CLINICAL DATA:  Status post fall today. Thoracic spine pain. Initial encounter. EXAM: CT THORACIC SPINE WITHOUT CONTRAST TECHNIQUE: Multidetector CT images of the thoracic were obtained using the standard protocol without intravenous contrast. COMPARISON:  PA and lateral chest 11/19/2018. FINDINGS: Alignment: Exaggeration of the normal thoracic kyphosis. No listhesis in the thoracic spine. Malalignment in the cervical spine is noted. Please see report of dedicated cervical spine CT scan. Vertebrae: There is a fracture of the spinous process of T1. The patient also has a mild biconcave compression fracture of T2 with vertebral body height loss approximately 60%. Fracture margins are sharp consistent with acute injury. No involvement of the posterior elements is identified. Mild superior endplate compression fracture of T3 with vertebral body height loss of approximately 10% is also identified. No involvement of the posterior elements. No other fracture is identified. Paraspinal and other soft tissues: Small left and small to moderate right pleural effusions are identified. There is cardiomegaly. Extensive atherosclerosis is present. Disc levels: Scattered loss of disc space height is noted. IMPRESSION: Acute biconcave compression fracture of T2 with  vertebral body height loss of up to 60%. Mild superior endplate compression fracture of T3 with vertebral body height loss of approximately 10%. No bony retropulsion or  involvement of the posterior elements at either level. Fracture of the spinous process of T1. Cervical spine fractures are incompletely imaged. Please see report of dedicated cervical spine CT this same day. Osteopenia. Cardiomegaly. Atherosclerosis. Small left and small to moderate right pleural effusions. Electronically Signed   By: Drusilla Kanner M.D.   On: 02/05/2019 11:52   Dg Chest Portable 1 View  Result Date: 02/05/2019 CLINICAL DATA:  Status post fall. Hypoxia. EXAM: PORTABLE CHEST 1 VIEW COMPARISON:  11/19/2018 FINDINGS: Enlarged cardiac silhouette. Calcific atherosclerotic disease of the aorta. There is no evidence of pneumothorax. Moderate right and small left pleural effusion. Pulmonary vascular congestion. Osseous structures are without acute abnormality. Soft tissues are grossly normal. IMPRESSION: 1. Enlarged cardiac silhouette with pulmonary vascular congestion. 2. Moderate right and small left pleural effusions. Electronically Signed   By: Ted Mcalpine M.D.   On: 02/05/2019 13:55    (Echo, Carotid, EGD, Colonoscopy, ERCP)    Subjective:   Discharge Exam: Vitals:   02/08/19 1125 02/08/19 1413  BP:  134/80  Pulse: 90 72  Resp: 16 17  Temp:  97.8 F (36.6 C)  SpO2: 97% 100%   Vitals:   02/07/19 2315 02/08/19 0618 02/08/19 1125 02/08/19 1413  BP: (!) 153/82 (!) 151/71  134/80  Pulse: 89 81 90 72  Resp: Temp: 98.6 F (37 C) 98.5 F (36.9 C)  97.8 F (36.6 C)  TempSrc: Oral   Axillary  SpO2: 99% 100% 97% 100%  Weight:      Height:        General: Pt is not in acute distress cervical collar in place Cardiovascular: RRR, S1/S2 +, no rubs, no gallops Respiratory: Decreased breath sounds bilaterally, no wheezing, no rhonchi Abdominal: Soft, NT, ND, bowel sounds + Extremities: 1+  edema   The results of significant diagnostics from this hospitalization (including imaging, microbiology, ancillary and laboratory) are listed below for reference.     Microbiology: No results found for this or any previous visit (from the past 240 hour(s)).   Labs: BNP (last 3 results) No results for input(s): BNP in the last 8760 hours. Basic Metabolic Panel: Recent Labs  Lab 02/05/19 1301 02/06/19 0427  NA 136 135  K 3.9 4.5  CL 101 102  CO2 24 24  GLUCOSE 128* 140*  BUN 26* 26*  CREATININE 0.94 0.94  CALCIUM 8.2* 8.1*   Liver Function Tests: Recent Labs  Lab 02/05/19 1301  AST 33  ALT 26  ALKPHOS 87  BILITOT 0.8  PROT 7.3  ALBUMIN 3.3*   No results for input(s): LIPASE, AMYLASE in the last 168 hours. No results for input(s): AMMONIA in the last 168 hours. CBC: Recent Labs  Lab 02/05/19 1301 02/06/19 0427  WBC 17.4* 11.8*  HGB 11.9* 11.5*  HCT 38.0 38.3  MCV 96.7 100.5*  PLT 208 168   Cardiac Enzymes: No results for input(s): CKTOTAL, CKMB, CKMBINDEX, TROPONINI in the last 168 hours. BNP: Invalid input(s): POCBNP CBG: No results for input(s): GLUCAP in the last 168 hours. D-Dimer No results for input(s): DDIMER in the last 72 hours. Hgb A1c No results for input(s): HGBA1C in the last 72 hours. Lipid Profile No results for input(s): CHOL, HDL, LDLCALC, TRIG, CHOLHDL, LDLDIRECT in the last 72 hours. Thyroid function studies No results for input(s): TSH, T4TOTAL, T3FREE, THYROIDAB in the last 72 hours.  Invalid input(s): FREET3 Anemia work up No results for input(s): VITAMINB12, FOLATE, FERRITIN, TIBC, IRON, RETICCTPCT in the  last 72 hours. Urinalysis    Component Value Date/Time   COLORURINE YELLOW 11/19/2018 0836   APPEARANCEUR CLEAR 11/19/2018 0836   LABSPEC 1.017 11/19/2018 0836   PHURINE 7.0 11/19/2018 0836   GLUCOSEU NEGATIVE 11/19/2018 0836   HGBUR NEGATIVE 11/19/2018 0836   BILIRUBINUR NEGATIVE 11/19/2018 0836   KETONESUR NEGATIVE  11/19/2018 0836   PROTEINUR NEGATIVE 11/19/2018 0836   UROBILINOGEN 0.2 10/30/2014 2242   NITRITE NEGATIVE 11/19/2018 0836   LEUKOCYTESUR NEGATIVE 11/19/2018 0836   Sepsis Labs Invalid input(s): PROCALCITONIN,  WBC,  LACTICIDVEN Microbiology No results found for this or any previous visit (from the past 240 hour(s)).   Time coordinating discharge: 37 minutes  SIGNED:   Alwyn Ren, MD  [facility] 02/09/2019, 8:09 AM Pager   If 7PM-7AM, please contact night-coverage [email or web address] Password [email or web address]

## 2019-02-13 DEATH — deceased

## 2019-08-12 NOTE — Addendum Note (Signed)
Addended by: Violeta Gelinas on: 08/12/2019 09:23 AM   Modules accepted: Level of Service

## 2020-02-18 IMAGING — DX DG ANKLE 2V *R*
1 series · 1 of 1 positions shown · non-contrast
Comparison: None.

CLINICAL DATA: Assess cellulitis.  Sepsis.

EXAM:
RIGHT ANKLE - 2 VIEW

[ankle lat]
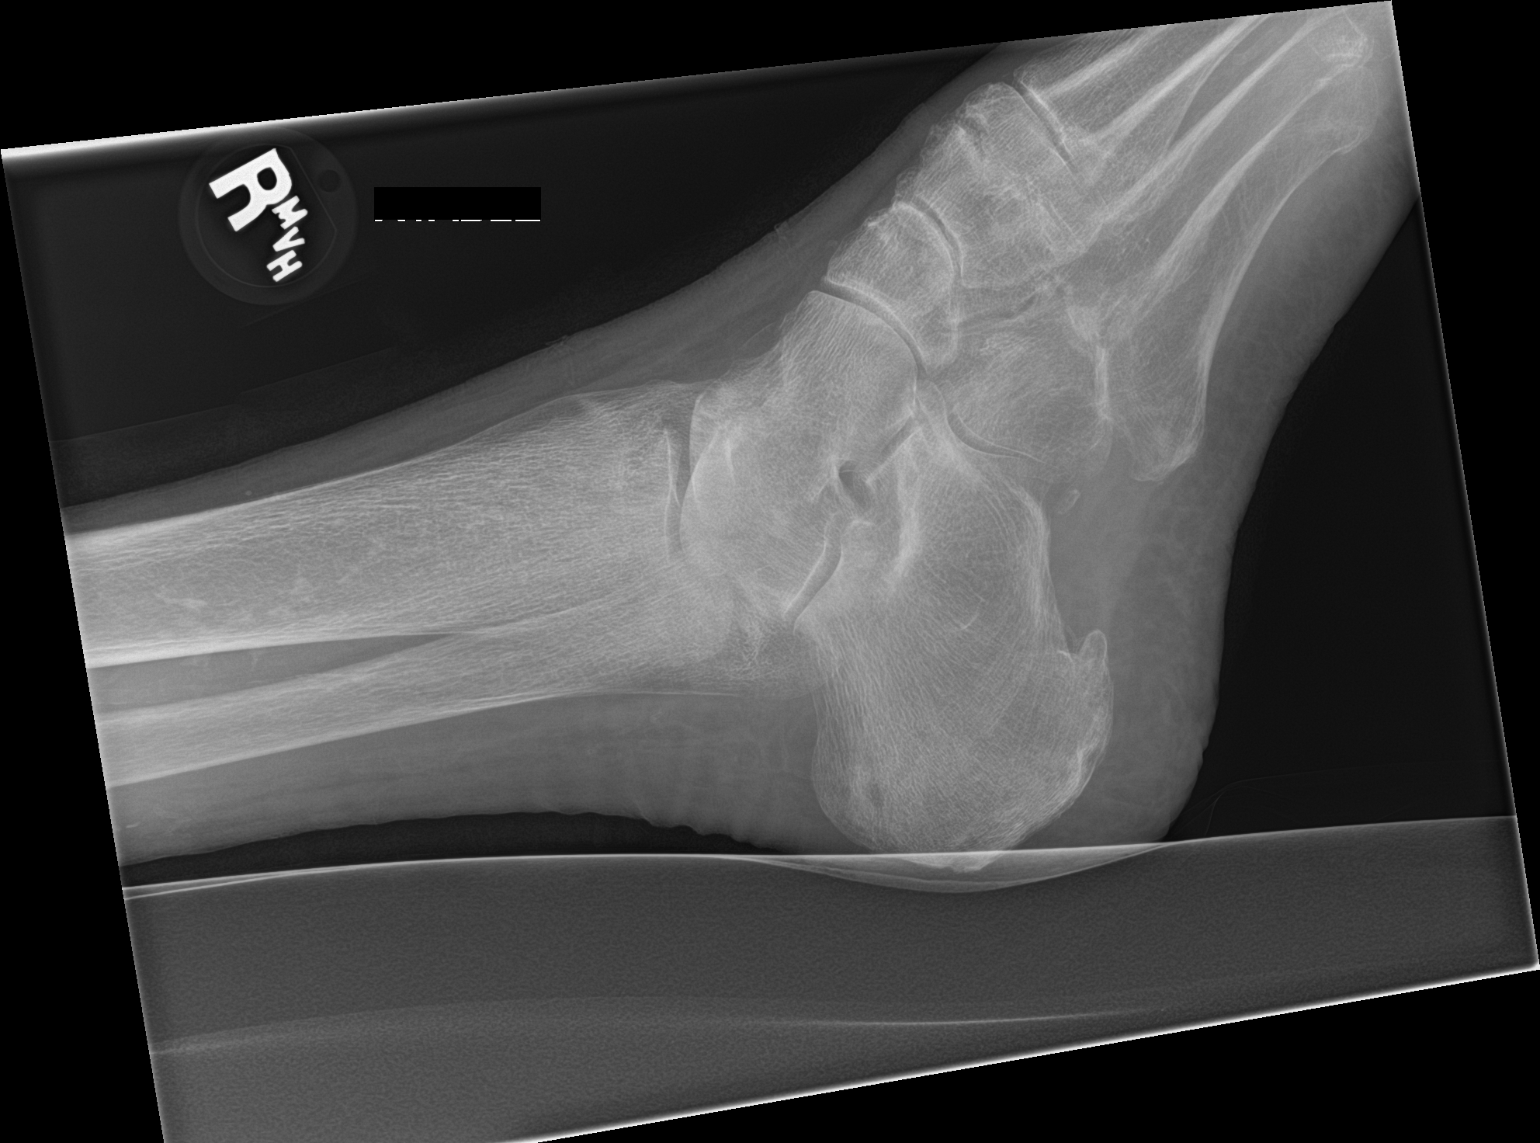

[1 of 1 positions shown; findings below may reference images not displayed]

FINDINGS: There is no evidence of fracture or dislocation. No osseous erosions
are seen to suggest osteomyelitis. The ankle mortise is grossly
unremarkable in appearance.

Mild degenerative change is noted about the midfoot. A small plantar
calcaneal spur is seen. An os peroneum is noted. Scattered vascular
calcifications are noted. No significant soft tissue swelling is
characterized on radiograph.
IMPRESSION: 1. No osseous erosions seen to suggest osteomyelitis. No evidence of
fracture or dislocation.
2. Scattered vascular calcifications seen.

## 2020-02-18 IMAGING — CR DG TIBIA/FIBULA 2V*L*
3 series · 3 of 3 positions shown · non-contrast
Comparison: None.

CLINICAL DATA: Assess cellulitis.  Sepsis.

EXAM:
LEFT TIBIA AND FIBULA - 2 VIEW

[x tib-fib ap left]
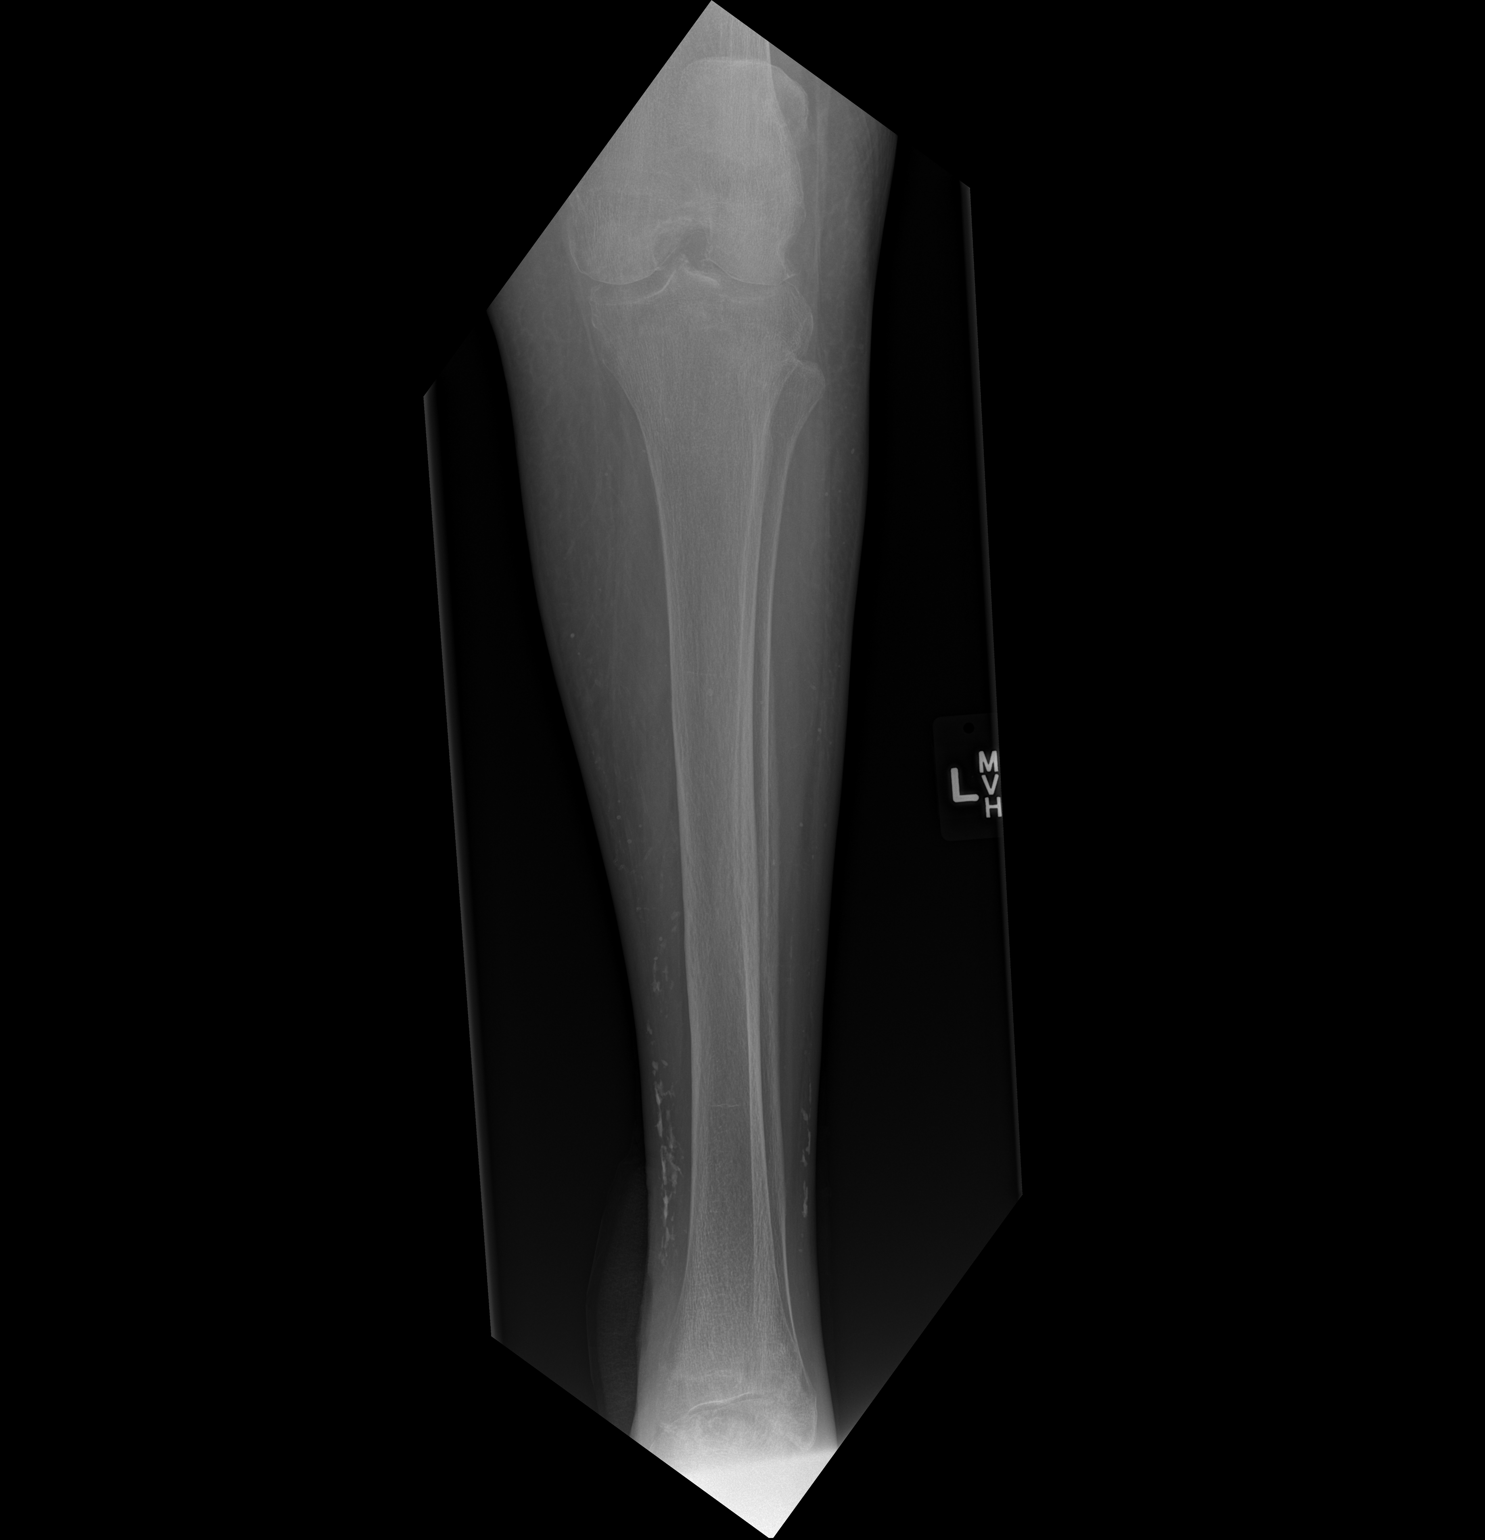

[x tib-fib lat left (1 of 2)]
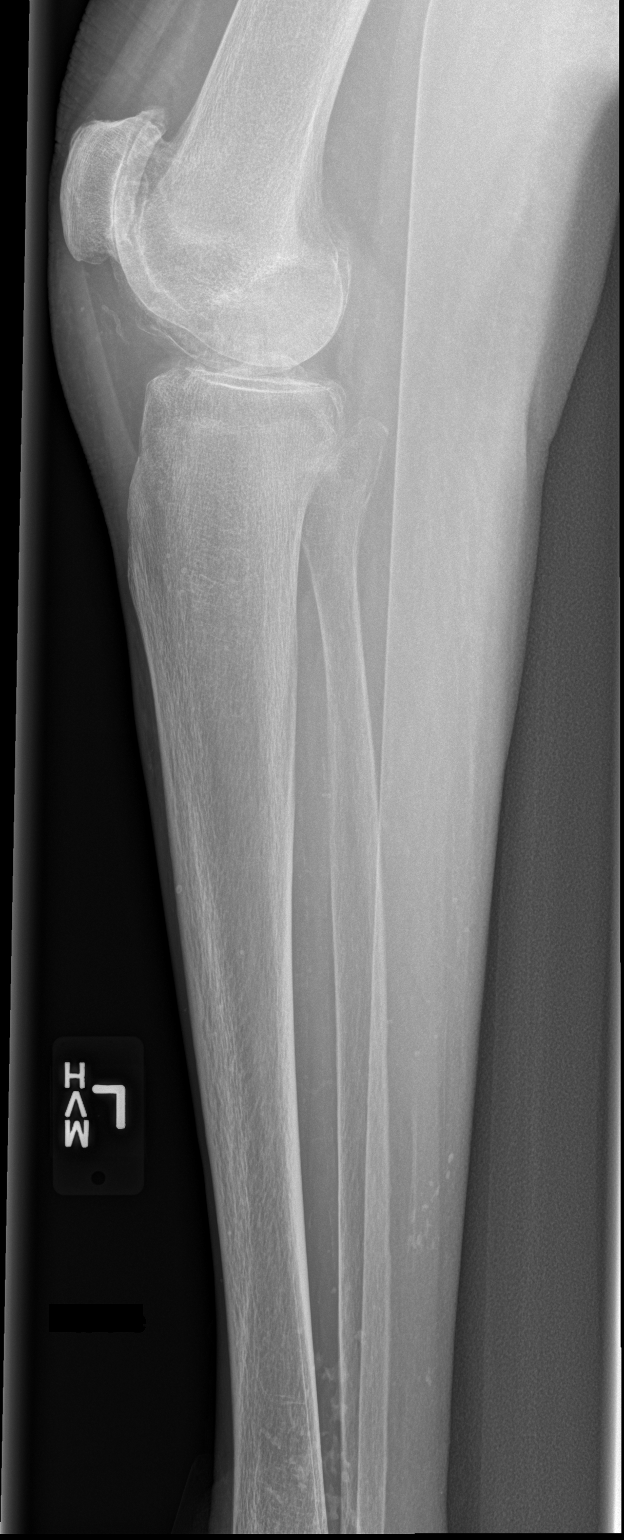

[x tib-fib lat left (2 of 2)]
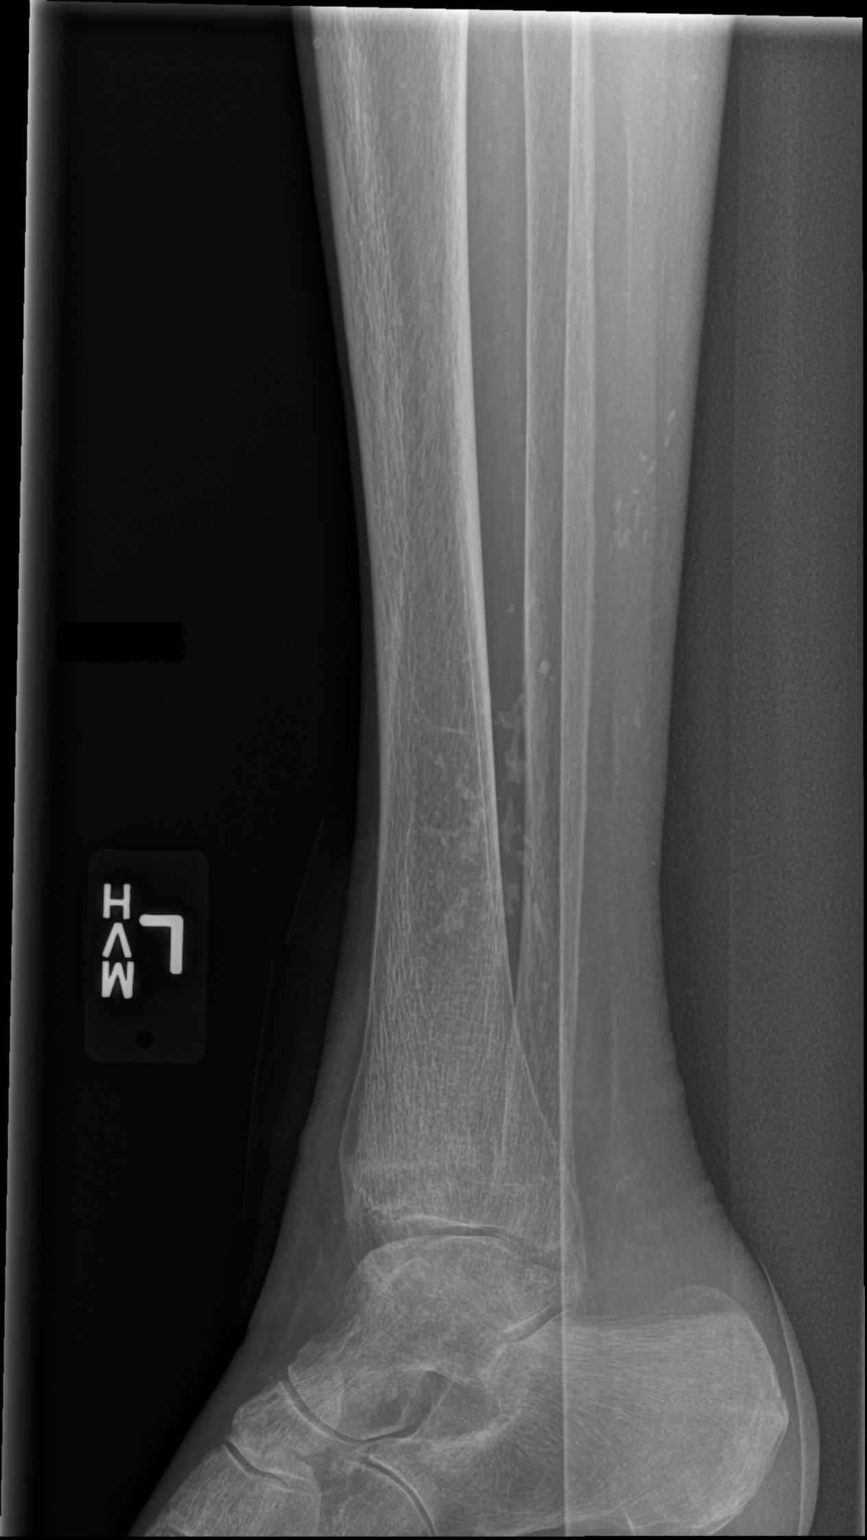

[3 of 3 positions shown; findings below may reference images not displayed]

FINDINGS: No osseous erosions are seen to suggest osteomyelitis. The tibia and
fibula appear grossly intact. Minimal degenerative change is noted
at the patellofemoral compartment. The visualized portions of the
left knee are grossly unremarkable.

The ankle mortise is not well characterized. Mild degenerative
change is suggested at the tibiotalar articulation. Scattered
vascular calcifications are seen.
IMPRESSION: 1. No osseous erosions seen to suggest osteomyelitis. Tibia and
fibula appear grossly intact.
2. Scattered vascular calcifications seen.

## 2020-02-18 IMAGING — DX DG CHEST 1V PORT
1 series · 1 of 1 positions shown · non-contrast
Comparison: 07/01/2016 CXR

CLINICAL DATA: Dyspnea

EXAM:
PORTABLE CHEST 1 VIEW

[chest ap]
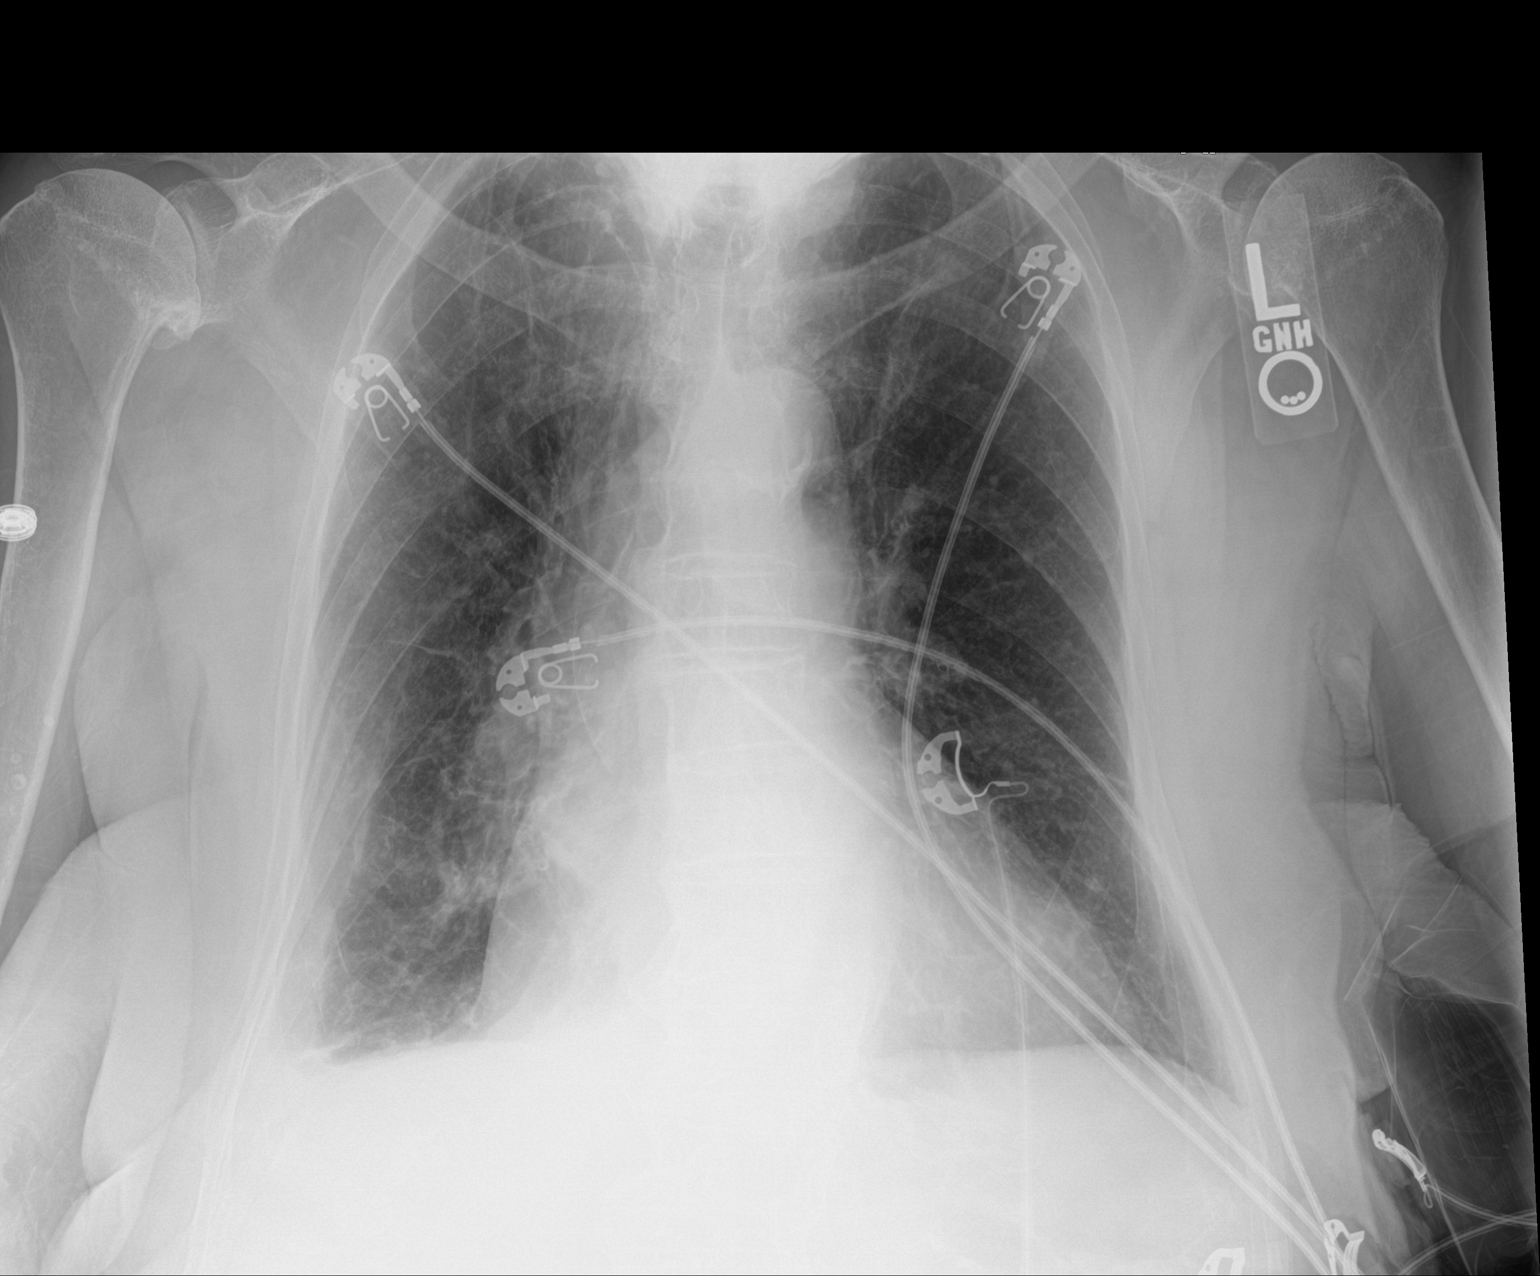

[1 of 1 positions shown; findings below may reference images not displayed]

FINDINGS: Emphysematous hyperinflation of the lungs with stable cardiomegaly
and aortic atherosclerosis. Scarring and atelectasis at the right
lung base. Blunting of the right costophrenic angle may reflect a
small effusion or pleural thickening. Osteoarthritis of the
glenohumeral joint on the right with inferomedial spurring off the
humeral head. Mild AC joint osteoarthritis is also noted on the
right.
IMPRESSION: COPD with aortic atherosclerosis.

## 2020-02-18 IMAGING — DX DG ANKLE 2V *L*
1 series · 1 of 1 positions shown · non-contrast
Comparison: None.

CLINICAL DATA: Assess cellulitis.  Sepsis.

EXAM:
LEFT ANKLE - 2 VIEW

[ankle lat]
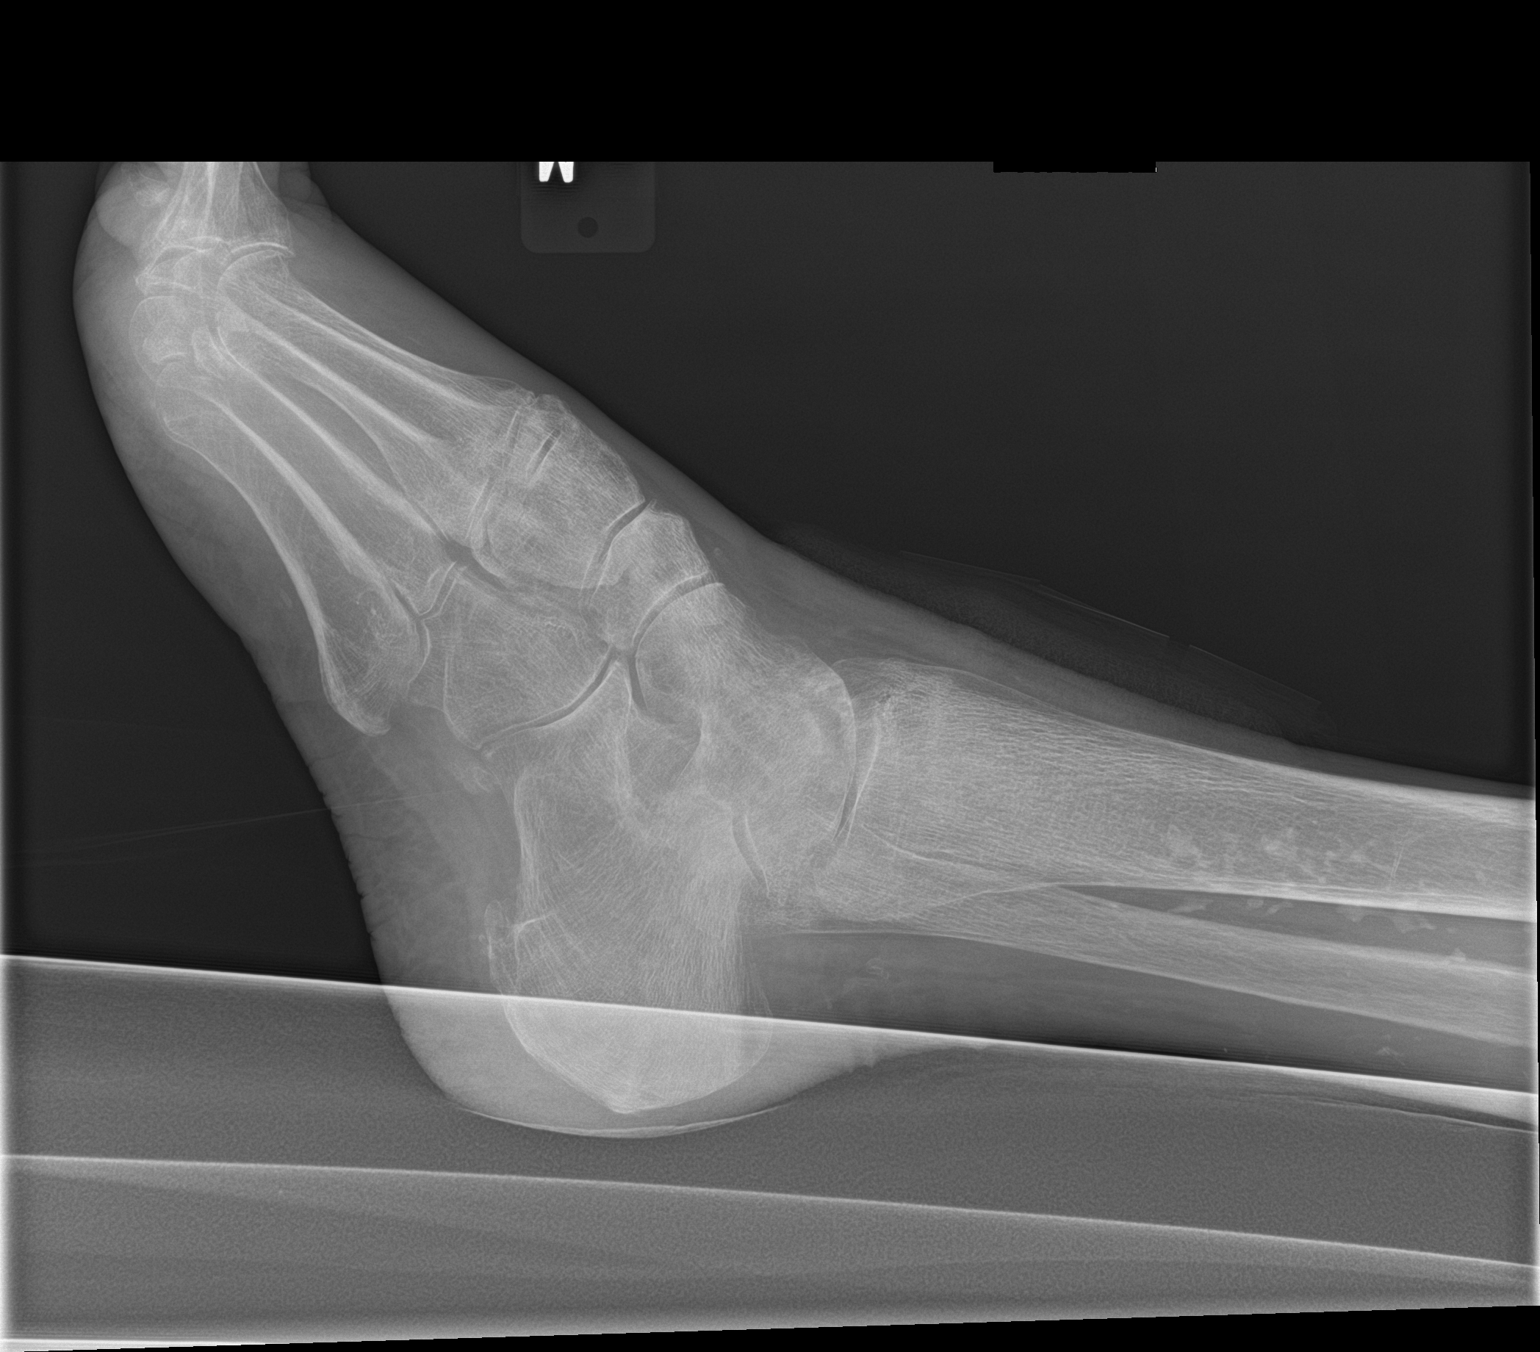

[1 of 1 positions shown; findings below may reference images not displayed]

FINDINGS: There is no evidence of fracture or dislocation. No osseous erosions
are definitely characterized to suggest osteomyelitis. However,
there is diffuse osteopenia of visualized osseous structures,
limiting evaluation.

Soft tissue swelling is noted about the lower leg and ankle, with a
dressing overlying the medial aspect of the ankle. Diffuse vascular
calcifications are seen.

Mild degenerative change is noted at the talar dome. A plantar
calcaneal spur is seen. An os peroneum is noted.
IMPRESSION: 1. No evidence of fracture or dislocation. No definite evidence for
osteomyelitis. However, diffuse osteopenia of visualized osseous
structures limits evaluation for erosions.
2. Diffuse vascular calcifications seen.
3. Soft tissue swelling about the lower leg and ankle.
